# Patient Record
Sex: Female | Born: 1955 | Race: White | Hispanic: No | Marital: Married | Smoking: Current every day smoker
Health system: Southern US, Community
[De-identification: ages and names within clinical notes are randomized; demographics above are authoritative.]

## PROBLEM LIST (undated history)

## (undated) DIAGNOSIS — F32A Depression, unspecified: Secondary | ICD-10-CM

## (undated) DIAGNOSIS — C801 Malignant (primary) neoplasm, unspecified: Secondary | ICD-10-CM

## (undated) DIAGNOSIS — F1010 Alcohol abuse, uncomplicated: Secondary | ICD-10-CM

## (undated) DIAGNOSIS — I480 Paroxysmal atrial fibrillation: Secondary | ICD-10-CM

## (undated) DIAGNOSIS — F329 Major depressive disorder, single episode, unspecified: Secondary | ICD-10-CM

## (undated) DIAGNOSIS — K709 Alcoholic liver disease, unspecified: Secondary | ICD-10-CM

## (undated) DIAGNOSIS — K219 Gastro-esophageal reflux disease without esophagitis: Secondary | ICD-10-CM

## (undated) HISTORY — PX: MOLE REMOVAL: SHX2046

---

## 2016-09-26 ENCOUNTER — Encounter: Payer: Self-pay | Admitting: Emergency Medicine

## 2016-09-26 ENCOUNTER — Inpatient Hospital Stay
Admission: EM | Admit: 2016-09-26 | Discharge: 2016-09-27 | DRG: 439 | Disposition: A | Discharge status: Home / Self Care | Payer: Self-pay | Attending: Internal Medicine | Admitting: Internal Medicine

## 2016-09-26 ENCOUNTER — Inpatient Hospital Stay: Payer: Self-pay

## 2016-09-26 DIAGNOSIS — R1114 Bilious vomiting: Secondary | ICD-10-CM

## 2016-09-26 DIAGNOSIS — Z79899 Other long term (current) drug therapy: Secondary | ICD-10-CM

## 2016-09-26 DIAGNOSIS — N390 Urinary tract infection, site not specified: Secondary | ICD-10-CM | POA: Diagnosis present

## 2016-09-26 DIAGNOSIS — K852 Alcohol induced acute pancreatitis without necrosis or infection: Principal | ICD-10-CM | POA: Diagnosis present

## 2016-09-26 DIAGNOSIS — Z23 Encounter for immunization: Secondary | ICD-10-CM

## 2016-09-26 DIAGNOSIS — K859 Acute pancreatitis without necrosis or infection, unspecified: Secondary | ICD-10-CM | POA: Diagnosis present

## 2016-09-26 DIAGNOSIS — E440 Moderate protein-calorie malnutrition: Secondary | ICD-10-CM | POA: Diagnosis present

## 2016-09-26 DIAGNOSIS — Z6822 Body mass index (BMI) 22.0-22.9, adult: Secondary | ICD-10-CM

## 2016-09-26 DIAGNOSIS — E876 Hypokalemia: Secondary | ICD-10-CM | POA: Diagnosis present

## 2016-09-26 DIAGNOSIS — E871 Hypo-osmolality and hyponatremia: Secondary | ICD-10-CM | POA: Diagnosis present

## 2016-09-26 DIAGNOSIS — F17210 Nicotine dependence, cigarettes, uncomplicated: Secondary | ICD-10-CM | POA: Diagnosis present

## 2016-09-26 DIAGNOSIS — F10988 Alcohol use, unspecified with other alcohol-induced disorder: Secondary | ICD-10-CM | POA: Diagnosis present

## 2016-09-26 DIAGNOSIS — E860 Dehydration: Secondary | ICD-10-CM | POA: Diagnosis present

## 2016-09-26 DIAGNOSIS — F329 Major depressive disorder, single episode, unspecified: Secondary | ICD-10-CM | POA: Diagnosis present

## 2016-09-26 HISTORY — DX: Major depressive disorder, single episode, unspecified: F32.9

## 2016-09-26 HISTORY — DX: Alcohol abuse, uncomplicated: F10.10

## 2016-09-26 HISTORY — DX: Depression, unspecified: F32.A

## 2016-09-26 LAB — URINALYSIS COMPLETE WITH MICROSCOPIC (ARMC ONLY)
Bilirubin Urine: NEGATIVE
Glucose, UA: NEGATIVE mg/dL
Leukocytes, UA: NEGATIVE
NITRITE: NEGATIVE
PROTEIN: 30 mg/dL — AB
Specific Gravity, Urine: 1.018 (ref 1.005–1.030)
pH: 5 (ref 5.0–8.0)

## 2016-09-26 LAB — CBC
HEMATOCRIT: 40.9 % (ref 35.0–47.0)
Hemoglobin: 14.4 g/dL (ref 12.0–16.0)
MCH: 34.7 pg — ABNORMAL HIGH (ref 26.0–34.0)
MCHC: 35.2 g/dL (ref 32.0–36.0)
MCV: 98.5 fL (ref 80.0–100.0)
PLATELETS: 205 10*3/uL (ref 150–440)
RBC: 4.16 MIL/uL (ref 3.80–5.20)
RDW: 12.5 % (ref 11.5–14.5)
WBC: 9.1 10*3/uL (ref 3.6–11.0)

## 2016-09-26 LAB — COMPREHENSIVE METABOLIC PANEL
ALBUMIN: 3.9 g/dL (ref 3.5–5.0)
ALK PHOS: 153 U/L — AB (ref 38–126)
ALT: 65 U/L — AB (ref 14–54)
AST: 145 U/L — AB (ref 15–41)
Anion gap: 26 — ABNORMAL HIGH (ref 5–15)
BILIRUBIN TOTAL: 2.4 mg/dL — AB (ref 0.3–1.2)
BUN: 35 mg/dL — AB (ref 6–20)
CALCIUM: 9.6 mg/dL (ref 8.9–10.3)
CO2: 26 mmol/L (ref 22–32)
CREATININE: 1.15 mg/dL — AB (ref 0.44–1.00)
Chloride: 76 mmol/L — ABNORMAL LOW (ref 101–111)
GFR calc Af Amer: 59 mL/min — ABNORMAL LOW (ref >60)
GFR calc non Af Amer: 51 mL/min — ABNORMAL LOW (ref >60)
GLUCOSE: 130 mg/dL — AB (ref 65–99)
POTASSIUM: 3.1 mmol/L — AB (ref 3.5–5.1)
Sodium: 128 mmol/L — ABNORMAL LOW (ref 135–145)
TOTAL PROTEIN: 7.9 g/dL (ref 6.5–8.1)

## 2016-09-26 LAB — TSH: TSH: 1.173 u[IU]/mL (ref 0.350–4.500)

## 2016-09-26 LAB — LIPASE, BLOOD: Lipase: 231 U/L — ABNORMAL HIGH (ref 11–51)

## 2016-09-26 LAB — MAGNESIUM: MAGNESIUM: 2.3 mg/dL (ref 1.7–2.4)

## 2016-09-26 LAB — TROPONIN I

## 2016-09-26 MED ORDER — POTASSIUM CHLORIDE IN NACL 40-0.9 MEQ/L-% IV SOLN
INTRAVENOUS | Status: DC
  Administered 2016-09-26: 125 mL/h via INTRAVENOUS
  Filled 2016-09-26 (×5): qty 1000

## 2016-09-26 MED ORDER — HYDROCODONE-ACETAMINOPHEN 5-325 MG PO TABS: 1.0000 | ORAL_TABLET | ORAL | Status: DC | PRN

## 2016-09-26 MED ORDER — POTASSIUM CHLORIDE CRYS ER 20 MEQ PO TBCR
40.0000 meq | EXTENDED_RELEASE_TABLET | ORAL | Status: AC
  Administered 2016-09-26: 40 meq via ORAL
  Filled 2016-09-26: qty 2

## 2016-09-26 MED ORDER — LORAZEPAM 2 MG/ML IJ SOLN: 1.0000 mg | Freq: Four times a day (QID) | INTRAMUSCULAR | Status: DC | PRN

## 2016-09-26 MED ORDER — ENOXAPARIN SODIUM 40 MG/0.4ML ~~LOC~~ SOLN
40.0000 mg | SUBCUTANEOUS | Status: DC
  Administered 2016-09-26: 40 mg via SUBCUTANEOUS

## 2016-09-26 MED ORDER — VITAMIN B-1 100 MG PO TABS
100.0000 mg | ORAL_TABLET | Freq: Every day | ORAL | Status: DC
  Administered 2016-09-26: 23:00:00 100 mg via ORAL
  Filled 2016-09-26: qty 1

## 2016-09-26 MED ORDER — FAMOTIDINE IN NACL 20-0.9 MG/50ML-% IV SOLN
20.0000 mg | Freq: Two times a day (BID) | INTRAVENOUS | Status: DC
  Administered 2016-09-26 – 2016-09-27 (×2): 20 mg via INTRAVENOUS
  Filled 2016-09-26 (×4): qty 50

## 2016-09-26 MED ORDER — ACETAMINOPHEN 650 MG RE SUPP: 650.0000 mg | Freq: Four times a day (QID) | RECTAL | Status: DC | PRN

## 2016-09-26 MED ORDER — ACETAMINOPHEN 325 MG PO TABS: 650.0000 mg | ORAL_TABLET | Freq: Four times a day (QID) | ORAL | Status: DC | PRN

## 2016-09-26 MED ORDER — ONDANSETRON HCL 4 MG/2ML IJ SOLN: 4.0000 mg | Freq: Four times a day (QID) | INTRAMUSCULAR | Status: DC | PRN

## 2016-09-26 MED ORDER — INFLUENZA VAC SPLIT QUAD 0.5 ML IM SUSY
0.5000 mL | PREFILLED_SYRINGE | INTRAMUSCULAR | Status: AC
  Administered 2016-09-27: 0.5 mL via INTRAMUSCULAR
  Filled 2016-09-26: qty 0.5

## 2016-09-26 MED ORDER — ADULT MULTIVITAMIN W/MINERALS CH
1.0000 | ORAL_TABLET | Freq: Every day | ORAL | Status: DC
  Administered 2016-09-26: 1 via ORAL
  Filled 2016-09-26: qty 1

## 2016-09-26 MED ORDER — MORPHINE SULFATE (PF) 2 MG/ML IV SOLN: 2.0000 mg | INTRAVENOUS | Status: DC | PRN

## 2016-09-26 MED ORDER — ONDANSETRON HCL 4 MG PO TABS: 4.0000 mg | ORAL_TABLET | Freq: Four times a day (QID) | ORAL | Status: DC | PRN

## 2016-09-26 MED ORDER — PNEUMOCOCCAL VAC POLYVALENT 25 MCG/0.5ML IJ INJ
0.5000 mL | INJECTION | INTRAMUSCULAR | Status: AC
  Administered 2016-09-27: 0.5 mL via INTRAMUSCULAR
  Filled 2016-09-26: qty 4

## 2016-09-26 MED ORDER — FOLIC ACID 1 MG PO TABS
1.0000 mg | ORAL_TABLET | Freq: Every day | ORAL | Status: DC
  Administered 2016-09-26: 1 mg via ORAL
  Filled 2016-09-26: qty 1

## 2016-09-26 MED ORDER — IOPAMIDOL (ISOVUE-300) INJECTION 61%
75.0000 mL | Freq: Once | INTRAVENOUS | Status: AC | PRN
Start: 2016-09-26 — End: 2016-09-26
  Administered 2016-09-26: 15:00:00 75 mL via INTRAVENOUS

## 2016-09-26 MED ORDER — SODIUM CHLORIDE 0.9 % IV SOLN
Freq: Once | INTRAVENOUS | Status: AC
  Administered 2016-09-26: 12:00:00 via INTRAVENOUS

## 2016-09-26 MED ORDER — LORAZEPAM 2 MG/ML IJ SOLN: 0.0000 mg | Freq: Two times a day (BID) | INTRAMUSCULAR | Status: DC

## 2016-09-26 MED ORDER — LORAZEPAM 1 MG PO TABS: 1.0000 mg | ORAL_TABLET | Freq: Four times a day (QID) | ORAL | Status: DC | PRN

## 2016-09-26 MED ORDER — THIAMINE HCL 100 MG/ML IJ SOLN
100.0000 mg | Freq: Every day | INTRAMUSCULAR | Status: DC
  Administered 2016-09-27: 09:00:00 100 mg via INTRAVENOUS
  Filled 2016-09-26: qty 2

## 2016-09-26 MED ORDER — ONDANSETRON HCL 4 MG/2ML IJ SOLN
4.0000 mg | Freq: Once | INTRAMUSCULAR | Status: AC
Start: 2016-09-26 — End: 2016-09-26
  Administered 2016-09-26: 4 mg via INTRAVENOUS
  Filled 2016-09-26: qty 2

## 2016-09-26 MED ORDER — IOPAMIDOL (ISOVUE-300) INJECTION 61%
30.0000 mL | Freq: Once | INTRAVENOUS | Status: AC | PRN
  Administered 2016-09-26: 30 mL via ORAL

## 2016-09-26 MED ORDER — LORAZEPAM 2 MG/ML IJ SOLN
0.0000 mg | Freq: Four times a day (QID) | INTRAMUSCULAR | Status: DC
  Administered 2016-09-26: 1 mg via INTRAVENOUS

## 2016-09-26 NOTE — ED Notes (Signed)
Pt still drinking her contrast advised she needs to finish her contrast so she can get her ct

## 2016-09-26 NOTE — ED Provider Notes (Signed)
[facility] Emergency Department Provider Note  ____________________________________________   First MD Initiated Contact with Patient 09/26/16 1137     (approximate)  I have reviewed the triage vital signs and the nursing notes.   HISTORY  Chief Complaint Emesis and Abdominal Pain   HPI [name] is a 60 y.o. female Reports nausea vomiting no diarrhea and abdominal pain starting 8 days ago. Patient's been unable to keep anything down for days. She states this morning she is able to keep some Jell-O down. She reports the symptoms started after she was coming off of alcohol binge. She has had these symptoms before but never this bad. Pain is achy in in the upper abdomen.patient also says she went to see her doctor was taking Macrobid for UTI that she had. Since improved but then when she started having nausea and vomiting she stopped the Macrobid and symptoms and return. Patient did not finish the course of Macrobid.   Past Medical History:  Diagnosis Date  . Depression   . ETOH abuse     Patient Active Problem List   Diagnosis Date Noted  . Acute pancreatitis 09/26/2016    Past Surgical History:  Procedure Laterality Date  . MOLE REMOVAL      Prior to Admission medications   Medication Sig Start Date End Date Taking? Authorizing Provider  TRAZODONE HCL PO Take by mouth.    Historical Provider, MD  venlafaxine XR (EFFEXOR-XR) 75 MG 24 hr capsule Take 225 mg by mouth daily with breakfast.    Historical Provider, MD    Allergies Review of patient's allergies indicates no known allergies.  Family History  Problem Relation Age of Onset  . Family history unknown: Yes    Social History Social History  Substance Use Topics  . Smoking status: Current Every Day Smoker    Packs/day: 0.50    Types: Cigarettes  . Smokeless tobacco: Never Used  . Alcohol use 14.4 oz/week    24 Cans of beer per week    Review of Systems Constitutional: No  fever/chills Eyes: No visual changes. ENT: No sore throat. Cardiovascular: Denies chest pain. Respiratory: Denies shortness of breath. Gastrointestinal: the history of present illness Genitourinary: patient reports dysuria as well.. Musculoskeletal: Negative for back pain. Skin: Negative for rash.  10-point ROS otherwise negative.  ____________________________________________   PHYSICAL EXAM:  VITAL SIGNS: ED Triage Vitals [09/26/16 1110]  Enc Vitals Group     BP 118/74     Pulse Rate (!) 103     Resp 18     Temp 97.8 F (36.6 C)     Temp Source Oral     SpO2 97 %     Weight 160 lb (72.6 kg)     Height 5\' 10"  (1.778 m)     Head Circumference      Peak Flow      Pain Score 8     Pain Loc      Pain Edu?      Excl. in [place]?     Constitutional: Alert and oriented. Well appearing and in no acute distress. Eyes: Conjunctivae are normal. PERRL. EOMI. Head: Atraumatic. Nose: No congestion/rhinnorhea. Mouth/Throat: Mucous membranes are moist.  Oropharynx non-erythematous. Neck: No stridor.  Cardiovascular: Normal rate, regular rhythm. Grossly normal heart sounds.  Good peripheral circulation. Respiratory: Normal respiratory effort.  No retractions. Lungs CTAB. Gastrointestinal: Soft mild to moderate tenderness in the upper abdomen. No distention. No abdominal bruits. No CVA tenderness. Musculoskeletal: No  lower extremity tenderness nor edema.  No joint effusions. Neurologic:  Normal speech and language. No gross focal neurologic deficits are appreciated. No gait instability. Skin:  Skin is warm, dry and intact. No rash noted. Psychiatric: Mood and affect are normal. Speech and behavior are normal.  ____________________________________________   LABS (all labs ordered are listed, but only abnormal results are displayed)  Labs Reviewed  LIPASE, BLOOD - Abnormal; Notable for the following:       Result Value   Lipase 231 (*)    All other components within normal limits    COMPREHENSIVE METABOLIC PANEL - Abnormal; Notable for the following:    Sodium 128 (*)    Potassium 3.1 (*)    Chloride 76 (*)    Glucose, Bld 130 (*)    BUN 35 (*)    Creatinine, Ser 1.15 (*)    AST 145 (*)    ALT 65 (*)    Alkaline Phosphatase 153 (*)    Total Bilirubin 2.4 (*)    GFR calc non Af Amer 51 (*)    GFR calc Af Amer 59 (*)    Anion gap 26 (*)    All other components within normal limits  CBC - Abnormal; Notable for the following:    MCH 34.7 (*)    All other components within normal limits  URINALYSIS COMPLETEWITH MICROSCOPIC ([facility] ONLY) - Abnormal; Notable for the following:    Color, Urine AMBER (*)    APPearance HAZY (*)    Ketones, ur 2+ (*)    Hgb urine dipstick 1+ (*)    Protein, ur 30 (*)    Bacteria, UA RARE (*)    Squamous Epithelial / LPF 6-30 (*)    All other components within normal limits  TROPONIN I  TSH  MAGNESIUM   ____________________________________________  EKG  EKG read and interpreted by me shows sinus tachycardia rate of 110 and normal axis nonspecific ST-T wave changes one PVC_______________________________________  RADIOLOGY  Study Result   CLINICAL DATA:  Vomiting and epigastric pain over the last week.  EXAM: CT ABDOMEN AND PELVIS WITH CONTRAST  TECHNIQUE: Multidetector CT imaging of the abdomen and pelvis was performed using the standard protocol following bolus administration of intravenous contrast.  CONTRAST:  42mL ISOVUE-300 IOPAMIDOL (ISOVUE-300) INJECTION 61%  COMPARISON:  None.  FINDINGS: Lower chest: Normal  Hepatobiliary: Profound diffuse fatty change of the liver. No calcified gallstones.  Pancreas: There is mild edema adjacent to the anterior surface of the pancreas at the junction of the pancreatic head and body. This could be evidence of low level pancreatitis. The remainder of the organ appears normal.  Spleen: Normal  Adrenals/Urinary Tract: The adrenal glands are normal. Both  kidneys appear intrinsically normal except for 2 small benign cyst ventrally on the right. No bladder abnormality.  Stomach/Bowel: No acute bowel pathology. There is diverticulosis without evidence of diverticulitis.  Vascular/Lymphatic: Mild aortic atherosclerosis.  No aneurysm.  Reproductive: Uterus and adnexal regions are normal for age.  Other: No ascites or free air.  Musculoskeletal: Ordinary degenerative changes affect the spine.  IMPRESSION: Profound diffuse fatty change of the liver which can be symptomatic.  Edema adjacent to the pancreas at the junction of the head and body suggesting low level pancreatitis.  Aortic atherosclerosis.   Electronically Signed   By: Nelson Chimes M.D.   On: 09/26/2016 15:02    ____________________________________________   PROCEDURES  Procedure(s) performed:   Procedures  Critical Care performed:   ____________________________________________  INITIAL IMPRESSION / ASSESSMENT AND PLAN / ED COURSE  Pertinent labs & imaging results that were available during my care of the patient were reviewed by me and considered in my medical decision making (see chart for details).    Clinical Course     ____________________________________________   FINAL CLINICAL IMPRESSION(S) / ED DIAGNOSES  Final diagnoses:  Bilious vomiting with nausea  Dehydration  Alcohol-induced acute pancreatitis without infection or necrosis  Hyponatremia      NEW MEDICATIONS STARTED DURING THIS VISIT:  Current Discharge Medication List       Note:  This document was prepared using Dragon voice recognition software and may include unintentional dictation errors.    Nena Polio, MD 09/26/16 [phone]

## 2016-09-26 NOTE — ED Triage Notes (Addendum)
Pt presents to ED with reports of vomiting and epigastric pain for over one week. Pt states she has had some loose stools but denies diarrhea. Pt denies diarrhea. Pt reports recently been treated for UTI with macrobid. Pt reports she took five days worth of medication. Pt reports an episode of numbness and tingling in the bottom of her feet and fingers which resolved with movement.

## 2016-09-26 NOTE — H&P (Signed)
[facility] [place] NAME: [name]    MR#:  [id number]  DATE OF BIRTH:  09/23/1956  DATE OF ADMISSION:  09/26/2016  PRIMARY CARE PHYSICIAN: Nathaneil Canary, PA-C   REQUESTING/REFERRING PHYSICIAN: Conni Slipper MD  CHIEF COMPLAINT:   Chief Complaint  Patient presents with  . Emesis  . Abdominal Pain    HISTORY OF PRESENT ILLNESS: [name]  is a 60 y.o. female with a known history of  Depression, history of alcohol abuse who is presenting to the ED complaining of having severe nausea and vomiting ongoing for the past 2 weeks. She reports that she has a history of binge drinking and then she stops drinking on her own and goes through withdrawals. Similar thing happen 2 weeks ago when she had her last drink subsequently she started having severe nausea and vomiting. She continued to have this. According to her she hasn't been able to eat solid food for 8 days. She continues to have nausea and dry heaving. She complains of abdominal cramping in the epigastric region. She denies any fevers chills she did have a bowel movement a few days prior which was soft. She does report recent treatment for UTI with Macrobid. Which she did not finish the course and still feels she has a urine infection.  PAST MEDICAL HISTORY:   Past Medical History:  Diagnosis Date  . Depression   . ETOH abuse     PAST SURGICAL HISTORY: Past Surgical History:  Procedure Laterality Date  . MOLE REMOVAL      SOCIAL HISTORY:  Social History  Substance Use Topics  . Smoking status: Current Every Day Smoker    Packs/day: 0.50    Types: Cigarettes  . Smokeless tobacco: Never Used  . Alcohol use 14.4 oz/week    24 Cans of beer per week    FAMILY HISTORY:  Family History  Problem Relation Age of Onset  . Family history unknown: Yes    DRUG ALLERGIES: No Known Allergies  REVIEW OF SYSTEMS:   CONSTITUTIONAL: No fever, fatigue or weakness.   EYES: No blurred or double vision.  EARS, NOSE, AND THROAT: No tinnitus or ear pain.  RESPIRATORY: No cough, shortness of breath, wheezing or hemoptysis.  CARDIOVASCULAR: No chest pain, orthopnea, edema.  GASTROINTESTINAL: Positive nausea, positive vomiting, no diarrhea or positive abdominal cramping GENITOURINARY: No dysuria, hematuria.  ENDOCRINE: No polyuria, nocturia,  HEMATOLOGY: No anemia, easy bruising or bleeding SKIN: No rash or lesion. MUSCULOSKELETAL: No joint pain or arthritis.   NEUROLOGIC: No tingling, numbness, weakness.  PSYCHIATRY: No anxiety or depression.   MEDICATIONS AT HOME:  Prior to Admission medications   Medication Sig Start Date End Date Taking? Authorizing Provider  TRAZODONE HCL PO Take by mouth.    Historical Provider, MD  venlafaxine XR (EFFEXOR-XR) 75 MG 24 hr capsule Take 225 mg by mouth daily with breakfast.    Historical Provider, MD      PHYSICAL EXAMINATION:   VITAL SIGNS: Blood pressure 118/74, pulse (!) 103, temperature 97.8 F (36.6 C), temperature source Oral, resp. rate 18, height 5\' 10"  (1.778 m), weight 160 lb (72.6 kg), SpO2 97 %.  GENERAL:  60 y.o.-year-old patient lying in the bed with no acute distress.  EYES: Pupils equal, round, reactive to light and accommodation. No scleral icterus. Extraocular muscles intact.  HEENT: Head atraumatic, normocephalic. Oropharynx and nasopharynx clear.  NECK:  Supple, no jugular venous distention. No thyroid enlargement, no tenderness.  LUNGS: Normal breath sounds bilaterally, no wheezing, rales,rhonchi or crepitation. No use of accessory muscles of respiration.  CARDIOVASCULAR: S1, S2 normal. No murmurs, rubs, or gallops.  ABDOMEN: Soft, mild tenderness, nondistended. Bowel sounds present. No organomegaly or mass.  EXTREMITIES: No pedal edema, cyanosis, or clubbing.  NEUROLOGIC: Cranial nerves II through XII are intact. Muscle strength 5/5 in all extremities. Sensation intact. Gait not checked.   PSYCHIATRIC: The patient is alert and oriented x 3.  SKIN: No obvious rash, lesion, or ulcer.   LABORATORY PANEL:   CBC  Recent Labs Lab 09/26/16 1115  WBC 9.1  HGB 14.4  HCT 40.9  PLT 205  MCV 98.5  MCH 34.7*  MCHC 35.2  RDW 12.5   ------------------------------------------------------------------------------------------------------------------  Chemistries   Recent Labs Lab 09/26/16 1115  NA 128*  K 3.1*  CL 76*  CO2 26  GLUCOSE 130*  BUN 35*  CREATININE 1.15*  CALCIUM 9.6  AST 145*  ALT 65*  ALKPHOS 153*  BILITOT 2.4*   ------------------------------------------------------------------------------------------------------------------ estimated creatinine clearance is 56.3 mL/min (by C-G formula based on SCr of 1.15 mg/dL (H)). ------------------------------------------------------------------------------------------------------------------ No results for input(s): TSH, T4TOTAL, T3FREE, THYROIDAB in the last 72 hours.  Invalid input(s): FREET3   Coagulation profile No results for input(s): INR, PROTIME in the last 168 hours. ------------------------------------------------------------------------------------------------------------------- No results for input(s): DDIMER in the last 72 hours. -------------------------------------------------------------------------------------------------------------------  Cardiac Enzymes  Recent Labs Lab 09/26/16 1115  TROPONINI <0.03   ------------------------------------------------------------------------------------------------------------------ Invalid input(s): POCBNP  ---------------------------------------------------------------------------------------------------------------  Urinalysis    Component Value Date/Time   COLORURINE AMBER (A) 09/26/2016 1208   APPEARANCEUR HAZY (A) 09/26/2016 1208   LABSPEC 1.018 09/26/2016 1208   PHURINE 5.0 09/26/2016 1208   GLUCOSEU NEGATIVE 09/26/2016 1208   HGBUR  1+ (A) 09/26/2016 1208   BILIRUBINUR NEGATIVE 09/26/2016 1208   KETONESUR 2+ (A) 09/26/2016 1208   PROTEINUR 30 (A) 09/26/2016 1208   NITRITE NEGATIVE 09/26/2016 1208   LEUKOCYTESUR NEGATIVE 09/26/2016 1208     RADIOLOGY: No results found.  EKG: Orders placed or performed during the hospital encounter of 09/26/16  . ED EKG  . ED EKG  . EKG 12-Lead  . EKG 12-Lead    IMPRESSION AND PLAN: Patient is a 61 year old presenting with abdominal pain nausea vomiting   1. Abdominal pain nausea vomiting Suspect due to possible mild acute pancreatitis as well as acute gastritis At this point I will keep her nothing by mouth We'll provide her IV fluids and supportive care and pain control and antiemetics CT scan has been ordered by the ED physician which is currently pending. Which we will review  2. History of alcohol abuse however no drink in 2 weeks at this point she is out of the window for withdrawals will monitor for any symptoms hold off on  CEWA protocol for now  3. Hypokalemia We'll replace potassium  4. MIsc: lovenox   All the records are reviewed and case discussed with ED provider. Management plans discussed with the patient, family and they are in agreement.  CODE STATUS:    Code Status Orders        Start     Ordered   09/26/16 1313  Full code  Continuous     09/26/16 1313    Code Status History    Date Active Date Inactive Code Status Order ID Comments User Context   This patient has a current code status but no historical code status.       TOTAL TIME TAKING CARE OF  THIS PATIENT: 55 minutes.    Dustin Flock M.D on 09/26/2016 at 1:19 PM  Between 7am to 6pm - Pager - [phone]  After 6pm go to [email or web address] - password [email or web address] [facility]  Office  [phone]  CC: Primary care physician; Nathaneil Canary, PA-C

## 2016-09-27 DIAGNOSIS — E440 Moderate protein-calorie malnutrition: Secondary | ICD-10-CM | POA: Insufficient documentation

## 2016-09-27 LAB — COMPREHENSIVE METABOLIC PANEL
ALK PHOS: 111 U/L (ref 38–126)
ALT: 40 U/L (ref 14–54)
ANION GAP: 10 (ref 5–15)
AST: 80 U/L — ABNORMAL HIGH (ref 15–41)
Albumin: 2.8 g/dL — ABNORMAL LOW (ref 3.5–5.0)
BUN: 22 mg/dL — ABNORMAL HIGH (ref 6–20)
CALCIUM: 8.2 mg/dL — AB (ref 8.9–10.3)
CO2: 30 mmol/L (ref 22–32)
CREATININE: 0.73 mg/dL (ref 0.44–1.00)
Chloride: 94 mmol/L — ABNORMAL LOW (ref 101–111)
Glucose, Bld: 89 mg/dL (ref 65–99)
Potassium: 3.9 mmol/L (ref 3.5–5.1)
SODIUM: 134 mmol/L — AB (ref 135–145)
Total Bilirubin: 0.8 mg/dL (ref 0.3–1.2)
Total Protein: 6 g/dL — ABNORMAL LOW (ref 6.5–8.1)

## 2016-09-27 LAB — CBC
HCT: 34.5 % — ABNORMAL LOW (ref 35.0–47.0)
Hemoglobin: 12.2 g/dL (ref 12.0–16.0)
MCH: 34.9 pg — AB (ref 26.0–34.0)
MCHC: 35.4 g/dL (ref 32.0–36.0)
MCV: 98.6 fL (ref 80.0–100.0)
PLATELETS: 156 10*3/uL (ref 150–440)
RBC: 3.5 MIL/uL — AB (ref 3.80–5.20)
RDW: 12.3 % (ref 11.5–14.5)
WBC: 5.2 10*3/uL (ref 3.6–11.0)

## 2016-09-27 LAB — LIPASE, BLOOD: LIPASE: 187 U/L — AB (ref 11–51)

## 2016-09-27 MED ORDER — NITROFURANTOIN MONOHYD MACRO 100 MG PO CAPS: 100.0000 mg | ORAL_CAPSULE | Freq: Two times a day (BID) | ORAL | 0 refills | Status: DC

## 2016-09-27 NOTE — Progress Notes (Signed)
Initial Nutrition Assessment  DOCUMENTATION CODES:   Non-severe (moderate) malnutrition in context of acute illness/injury  INTERVENTION:  -Recommend Ensure Enlive po BID, each supplement provides 350 kcal and 20 grams of protein once diet progressed.       NUTRITION DIAGNOSIS:   Malnutrition related to acute illness as evidenced by percent weight loss, energy intake < 75% for > 7 days, mild depletion of muscle mass.    GOAL:   Patient will meet greater than or equal to 90% of their needs    MONITOR:   Diet advancement  REASON FOR ASSESSMENT:   Malnutrition Screening Tool    ASSESSMENT:      60 y.o female admitted with severe nausea, vomiting ongoing for the past 2 weeks, found to have mild acute pancreatitis and acute gastritis.  Noted pt has not been able to eat solid foods in the last 8 days. Recent treatment for UTI  Pt with history of Etoh abuse, depression  Pt reports poor po intake for the past 8 days, mainly drinking liquids secondary to nausea and vomiting.    Reports wt of 160 pounds at urgent care about 2 weeks ago. Current wt of 153 pounds (4% wt loss in the last 2 weeks)  Medications reviewed: folic acid, MVI, thiamine, NS with KCL at 135ml/hr Labs reviewed: Na 134, BUN 22, creatinine WDL, glucose 187  Nutrition-Focused physical exam completed. Findings are no fat depletion, mild muscle depletion, and no edema.    Diet Order:  Diet NPO time specified  Skin:  Reviewed, no issues  Last BM:  10/1  Height:   Ht Readings from Last 1 Encounters:  09/26/16 5\' 10"  (1.778 m)    Weight:   Wt Readings from Last 1 Encounters:  09/26/16 153 lb 9.6 oz (69.7 kg)    Ideal Body Weight:     BMI:  Body mass index is 22.04 kg/m.  Estimated Nutritional Needs:   Kcal:  1725-2070 kcals/d  Protein:  83-103 g/d  Fluid:  >/=1739ml/d  EDUCATION NEEDS:   No education needs identified at this time  Lanorris Kalisz B. Zenia Resides, [place], [place]  (pager) Weekend/On-Call pager [phone])

## 2016-09-27 NOTE — Discharge Instructions (Signed)
Resume diet and activity as before ° ° °

## 2016-09-28 NOTE — Discharge Summary (Signed)
[facility] at [place] NAME: [name]    MR#:  [id number]  DATE OF BIRTH:  08/13/1956  DATE OF ADMISSION:  09/26/2016 ADMITTING PHYSICIAN: Dustin Flock, MD  DATE OF DISCHARGE: 09/27/2016  2:55 PM  PRIMARY CARE PHYSICIAN: Nathaneil Canary, PA-C   ADMISSION DIAGNOSIS:  Dehydration [E86.0] Hyponatremia [E87.1] Bilious vomiting with nausea [R11.14] Alcohol-induced acute pancreatitis without infection or necrosis [K85.20]  DISCHARGE DIAGNOSIS:  Active Problems:   Acute pancreatitis   Malnutrition of moderate degree   SECONDARY DIAGNOSIS:   Past Medical History:  Diagnosis Date  . Depression   . ETOH abuse      ADMITTING HISTORY  HISTORY OF PRESENT ILLNESS: [name]  is a 60 y.o. female with a known history of  Depression, history of alcohol abuse who is presenting to the ED complaining of having severe nausea and vomiting ongoing for the past 2 weeks. She reports that she has a history of binge drinking and then she stops drinking on her own and goes through withdrawals. Similar thing happen 2 weeks ago when she had her last drink subsequently she started having severe nausea and vomiting. She continued to have this. According to her she hasn't been able to eat solid food for 8 days. She continues to have nausea and dry heaving. She complains of abdominal cramping in the epigastric region. She denies any fevers chills she did have a bowel movement a few days prior which was soft. She does report recent treatment for UTI with Macrobid. Which she did not finish the course and still feels she has a urine infection.  HOSPITAL COURSE:   1. Acute alcoholic pancreatitis Elevated lipase has treded down. CT abd showed some fatty liver and mild pancreatitis. By day of discharge she has tolerated diet. No pain or nausea. Counseled to quit alcohol  3. Hypokalemia Replaced and resolved  Stable for discharge home  CONSULTS  OBTAINED:    DRUG ALLERGIES:  No Known Allergies  DISCHARGE MEDICATIONS:   Discharge Medication List as of 09/27/2016  1:44 PM    START taking these medications   Details  nitrofurantoin, macrocrystal-monohydrate, (MACROBID) 100 MG capsule Take 1 capsule (100 mg total) by mouth 2 (two) times daily., Starting Mon 09/27/2016, Normal        Today   VITAL SIGNS:  Blood pressure (!) 101/51, pulse 89, temperature 98.2 F (36.8 C), temperature source Oral, resp. rate 20, height 5\' 10"  (1.778 m), weight 69.7 kg (153 lb 9.6 oz), SpO2 97 %.  I/O:  No intake or output data in the 24 hours ending 09/28/16 1617  PHYSICAL EXAMINATION:  Physical Exam  GENERAL:  60 y.o.-year-old patient lying in the bed with no acute distress.  LUNGS: Normal breath sounds bilaterally, no wheezing, rales,rhonchi or crepitation. No use of accessory muscles of respiration.  CARDIOVASCULAR: S1, S2 normal. No murmurs, rubs, or gallops.  ABDOMEN: Soft, non-tender, non-distended. Bowel sounds present. No organomegaly or mass.  NEUROLOGIC: Moves all 4 extremities. PSYCHIATRIC: The patient is alert and oriented x 3.  SKIN: No obvious rash, lesion, or ulcer.   DATA REVIEW:   CBC  Recent Labs Lab 09/27/16 0509  WBC 5.2  HGB 12.2  HCT 34.5*  PLT 156    Chemistries   Recent Labs Lab 09/26/16 1115 09/27/16 0509  NA 128* 134*  K 3.1* 3.9  CL 76* 94*  CO2 26 30  GLUCOSE 130* 89  BUN 35* 22*  CREATININE 1.15*  0.73  CALCIUM 9.6 8.2*  MG 2.3  --   AST 145* 80*  ALT 65* 40  ALKPHOS 153* 111  BILITOT 2.4* 0.8    Cardiac Enzymes  Recent Labs Lab 09/26/16 1115  [place] <0.03    Microbiology Results  No results found for this or any previous visit.  RADIOLOGY:  No results found.  Follow up with PCP in 1 week.  Management plans discussed with the patient, family and they are in agreement.  CODE STATUS:  Code Status History    Date Active Date Inactive Code Status Order ID Comments  User Context   09/26/2016  1:13 PM 09/27/2016  6:02 PM Full Code [id number]  Dustin Flock, MD ED    Advance Directive Documentation   Flowsheet Row Most Recent Value  Type of Advance Directive  Healthcare Power of Attorney, Living will  Pre-existing out of facility DNR order (yellow form or pink MOST form)  No data  "MOST" Form in Place?  No data      TOTAL TIME TAKING CARE OF THIS PATIENT ON DAY OF DISCHARGE: more than 30 minutes.   Hillary Bow R M.D on 09/28/2016 at 4:17 PM  Between 7am to 6pm - Pager - [phone]  After 6pm go to [email or web address] - password [email or web address] [facility]  [facility]  Office  [phone]  CC: Primary care physician; Nathaneil Canary, PA-C  Note: This dictation was prepared with Dragon dictation along with smaller phrase technology. Any transcriptional errors that result from this process are unintentional.

## 2017-04-27 ENCOUNTER — Ambulatory Visit: Payer: Self-pay | Attending: Oncology

## 2017-04-27 ENCOUNTER — Ambulatory Visit
Admission: RE | Admit: 2017-04-27 | Discharge: 2017-04-27 | Disposition: A | Discharge status: Home / Self Care | Payer: Self-pay | Source: Ambulatory Visit | Attending: Oncology | Admitting: Oncology

## 2017-04-27 VITALS — BP 112/77 | HR 98 | Temp 98.8°F | Ht 69.0 in | Wt 158.0 lb

## 2017-04-27 DIAGNOSIS — Z0000 Encounter for general adult medical examination without abnormal findings: Secondary | ICD-10-CM

## 2017-04-27 HISTORY — DX: Malignant (primary) neoplasm, unspecified: C80.1

## 2017-04-27 NOTE — Progress Notes (Signed)
Subjective:     Patient ID: [name], female   DOB: 06-15-1956, 61 y.o.   MRN: [id number]  HPI   Review of Systems     Objective:   Physical Exam  Pulmonary/Chest: Right breast exhibits no inverted nipple, no mass, no nipple discharge, no skin change and no tenderness. Left breast exhibits no inverted nipple, no mass, no nipple discharge, no skin change and no tenderness. Breasts are symmetrical.  Genitourinary: No labial fusion. There is no rash, tenderness, lesion or injury on the right labia. There is no rash, tenderness, lesion or injury on the left labia. Uterus is not deviated, not enlarged, not fixed and not tender. Cervix exhibits no motion tenderness, no discharge and no friability. Right adnexum displays no mass, no tenderness and no fullness. Left adnexum displays no mass, no tenderness and no fullness. No erythema, tenderness or bleeding in the vagina. No signs of injury around the vagina. No vaginal discharge found.       Assessment:     61 year old patient presents for BCCCP clinic visit.  Patient screened, and meets BCCCP eligibility.  Patient does not have insurance, Medicare or Medicaid.  Handout given on Affordable Care Act.  Instructed patient on breast self-exam using teach back method.  CBE unremarkable. No mass or lump palpated.   Pelvic exam normal.  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  Last mammogram at [facility].    Plan:     Sent for bilateral screening mammogram.  Specimen collected for pap.

## 2017-04-28 ENCOUNTER — Inpatient Hospital Stay
Admission: RE | Admit: 2017-04-28 | Discharge: 2017-04-28 | Disposition: A | Discharge status: Home / Self Care | Payer: Self-pay | Source: Ambulatory Visit | Attending: *Deleted | Admitting: *Deleted

## 2017-04-28 ENCOUNTER — Other Ambulatory Visit: Payer: Self-pay | Admitting: *Deleted

## 2017-04-28 DIAGNOSIS — Z9289 Personal history of other medical treatment: Secondary | ICD-10-CM

## 2017-04-30 LAB — PAP LB AND HPV HIGH-RISK
HPV, high-risk: NEGATIVE
PAP Smear Comment: 0

## 2017-05-04 NOTE — Progress Notes (Signed)
Phoned patient with negative/negative pap, and normal mammogram results.  Have not completed HSIS Data.

## 2017-05-17 NOTE — Progress Notes (Signed)
Copy to HSIS. 

## 2017-11-26 ENCOUNTER — Other Ambulatory Visit: Payer: Self-pay

## 2017-11-26 ENCOUNTER — Inpatient Hospital Stay
Admission: EM | Admit: 2017-11-26 | Discharge: 2017-11-29 | DRG: 309 | Disposition: A | Discharge status: Home / Self Care | Payer: Self-pay | Attending: Internal Medicine | Admitting: Internal Medicine

## 2017-11-26 ENCOUNTER — Inpatient Hospital Stay: Payer: Self-pay

## 2017-11-26 DIAGNOSIS — E871 Hypo-osmolality and hyponatremia: Secondary | ICD-10-CM | POA: Diagnosis present

## 2017-11-26 DIAGNOSIS — I4891 Unspecified atrial fibrillation: Principal | ICD-10-CM | POA: Diagnosis present

## 2017-11-26 DIAGNOSIS — Y900 Blood alcohol level of less than 20 mg/100 ml: Secondary | ICD-10-CM | POA: Diagnosis present

## 2017-11-26 DIAGNOSIS — K5900 Constipation, unspecified: Secondary | ICD-10-CM | POA: Diagnosis present

## 2017-11-26 DIAGNOSIS — E872 Acidosis: Secondary | ICD-10-CM | POA: Diagnosis present

## 2017-11-26 DIAGNOSIS — R945 Abnormal results of liver function studies: Secondary | ICD-10-CM | POA: Diagnosis present

## 2017-11-26 DIAGNOSIS — F17210 Nicotine dependence, cigarettes, uncomplicated: Secondary | ICD-10-CM | POA: Diagnosis present

## 2017-11-26 DIAGNOSIS — E876 Hypokalemia: Secondary | ICD-10-CM | POA: Diagnosis present

## 2017-11-26 DIAGNOSIS — Z79899 Other long term (current) drug therapy: Secondary | ICD-10-CM

## 2017-11-26 DIAGNOSIS — R531 Weakness: Secondary | ICD-10-CM | POA: Diagnosis present

## 2017-11-26 DIAGNOSIS — E8729 Other acidosis: Secondary | ICD-10-CM

## 2017-11-26 DIAGNOSIS — E860 Dehydration: Secondary | ICD-10-CM | POA: Diagnosis present

## 2017-11-26 DIAGNOSIS — I480 Paroxysmal atrial fibrillation: Secondary | ICD-10-CM | POA: Diagnosis present

## 2017-11-26 DIAGNOSIS — F1020 Alcohol dependence, uncomplicated: Secondary | ICD-10-CM | POA: Diagnosis present

## 2017-11-26 DIAGNOSIS — R188 Other ascites: Secondary | ICD-10-CM

## 2017-11-26 DIAGNOSIS — F329 Major depressive disorder, single episode, unspecified: Secondary | ICD-10-CM | POA: Diagnosis present

## 2017-11-26 DIAGNOSIS — R1110 Vomiting, unspecified: Secondary | ICD-10-CM

## 2017-11-26 DIAGNOSIS — I471 Supraventricular tachycardia: Secondary | ICD-10-CM | POA: Diagnosis present

## 2017-11-26 DIAGNOSIS — E878 Other disorders of electrolyte and fluid balance, not elsewhere classified: Secondary | ICD-10-CM | POA: Diagnosis present

## 2017-11-26 DIAGNOSIS — Z8719 Personal history of other diseases of the digestive system: Secondary | ICD-10-CM

## 2017-11-26 DIAGNOSIS — K7460 Unspecified cirrhosis of liver: Secondary | ICD-10-CM | POA: Diagnosis present

## 2017-11-26 DIAGNOSIS — I951 Orthostatic hypotension: Secondary | ICD-10-CM | POA: Diagnosis present

## 2017-11-26 DIAGNOSIS — I10 Essential (primary) hypertension: Secondary | ICD-10-CM | POA: Diagnosis present

## 2017-11-26 LAB — COMPREHENSIVE METABOLIC PANEL
ALT: 71 U/L — ABNORMAL HIGH (ref 14–54)
ANION GAP: 30 — AB (ref 5–15)
AST: 216 U/L — ABNORMAL HIGH (ref 15–41)
Albumin: 3.7 g/dL (ref 3.5–5.0)
Alkaline Phosphatase: 190 U/L — ABNORMAL HIGH (ref 38–126)
BUN: 23 mg/dL — ABNORMAL HIGH (ref 6–20)
CHLORIDE: 80 mmol/L — AB (ref 101–111)
CO2: 20 mmol/L — AB (ref 22–32)
Calcium: 11.3 mg/dL — ABNORMAL HIGH (ref 8.9–10.3)
Creatinine, Ser: 1.22 mg/dL — ABNORMAL HIGH (ref 0.44–1.00)
GFR, EST AFRICAN AMERICAN: 54 mL/min — AB (ref >60)
GFR, EST NON AFRICAN AMERICAN: 47 mL/min — AB (ref >60)
Glucose, Bld: 236 mg/dL — ABNORMAL HIGH (ref 65–99)
POTASSIUM: 2.3 mmol/L — AB (ref 3.5–5.1)
SODIUM: 130 mmol/L — AB (ref 135–145)
Total Bilirubin: 2.3 mg/dL — ABNORMAL HIGH (ref 0.3–1.2)
Total Protein: 8 g/dL (ref 6.5–8.1)

## 2017-11-26 LAB — CBC WITH DIFFERENTIAL/PLATELET
Basophils Absolute: 0.1 10*3/uL (ref 0–0.1)
Basophils Relative: 1 %
EOS PCT: 0 %
Eosinophils Absolute: 0 10*3/uL (ref 0–0.7)
HCT: 48.2 % — ABNORMAL HIGH (ref 35.0–47.0)
Hemoglobin: 16.4 g/dL — ABNORMAL HIGH (ref 12.0–16.0)
LYMPHS ABS: 2.9 10*3/uL (ref 1.0–3.6)
Lymphocytes Relative: 18 %
MCH: 36.2 pg — AB (ref 26.0–34.0)
MCHC: 34.1 g/dL (ref 32.0–36.0)
MCV: 106.4 fL — ABNORMAL HIGH (ref 80.0–100.0)
MONO ABS: 1.8 10*3/uL — AB (ref 0.2–0.9)
Monocytes Relative: 11 %
Neutro Abs: 11.5 10*3/uL — ABNORMAL HIGH (ref 1.4–6.5)
Neutrophils Relative %: 70 %
Platelets: 356 10*3/uL (ref 150–440)
RBC: 4.53 MIL/uL (ref 3.80–5.20)
RDW: 14.6 % — AB (ref 11.5–14.5)
WBC: 16.3 10*3/uL — AB (ref 3.6–11.0)

## 2017-11-26 LAB — ETHANOL

## 2017-11-26 LAB — TROPONIN I

## 2017-11-26 LAB — MAGNESIUM: MAGNESIUM: 1.5 mg/dL — AB (ref 1.7–2.4)

## 2017-11-26 LAB — LIPASE, BLOOD: LIPASE: 41 U/L (ref 11–51)

## 2017-11-26 MED ORDER — DIGOXIN 0.25 MG/ML IJ SOLN
0.5000 mg | Freq: Once | INTRAMUSCULAR | Status: AC
  Administered 2017-11-26: 0.5 mg via INTRAVENOUS
  Filled 2017-11-26: qty 2

## 2017-11-26 MED ORDER — PANTOPRAZOLE SODIUM 40 MG IV SOLR
INTRAVENOUS | Status: AC
  Administered 2017-11-26: 40 mg
  Filled 2017-11-26: qty 40

## 2017-11-26 MED ORDER — KCL IN DEXTROSE-NACL 40-5-0.45 MEQ/L-%-% IV SOLN
INTRAVENOUS | Status: DC
  Administered 2017-11-26: 22:00:00 via INTRAVENOUS
  Filled 2017-11-26 (×5): qty 1000

## 2017-11-26 MED ORDER — NICOTINE 14 MG/24HR TD PT24
14.0000 mg | MEDICATED_PATCH | Freq: Every day | TRANSDERMAL | Status: DC
  Filled 2017-11-26 (×3): qty 1

## 2017-11-26 MED ORDER — POTASSIUM CHLORIDE CRYS ER 20 MEQ PO TBCR
20.0000 meq | EXTENDED_RELEASE_TABLET | Freq: Once | ORAL | Status: AC
  Administered 2017-11-26: 20 meq via ORAL

## 2017-11-26 MED ORDER — ONDANSETRON HCL 4 MG/2ML IJ SOLN
INTRAMUSCULAR | Status: AC
  Administered 2017-11-26: 4 mg
  Filled 2017-11-26: qty 2

## 2017-11-26 MED ORDER — MAGNESIUM SULFATE 2 GM/50ML IV SOLN
2.0000 g | Freq: Once | INTRAVENOUS | Status: AC
  Administered 2017-11-26: 2 g via INTRAVENOUS
  Filled 2017-11-26: qty 50

## 2017-11-26 MED ORDER — POTASSIUM CHLORIDE CRYS ER 20 MEQ PO TBCR
40.0000 meq | EXTENDED_RELEASE_TABLET | Freq: Once | ORAL | Status: AC
  Administered 2017-11-26: 40 meq via ORAL

## 2017-11-26 MED ORDER — POTASSIUM CHLORIDE CRYS ER 20 MEQ PO TBCR: 60.0000 meq | EXTENDED_RELEASE_TABLET | Freq: Once | ORAL | Status: DC

## 2017-11-26 MED ORDER — POTASSIUM CHLORIDE CRYS ER 20 MEQ PO TBCR
EXTENDED_RELEASE_TABLET | ORAL | Status: AC
  Filled 2017-11-26: qty 2

## 2017-11-26 MED ORDER — AMIODARONE IV BOLUS ONLY 150 MG/100ML
INTRAVENOUS | Status: AC
  Filled 2017-11-26: qty 100

## 2017-11-26 MED ORDER — DILTIAZEM HCL 25 MG/5ML IV SOLN
10.0000 mg | Freq: Once | INTRAVENOUS | Status: DC
  Filled 2017-11-26: qty 5

## 2017-11-26 MED ORDER — POTASSIUM CHLORIDE 10 MEQ/100ML IV SOLN
10.0000 meq | INTRAVENOUS | Status: DC
  Administered 2017-11-27 (×3): 10 meq via INTRAVENOUS
  Filled 2017-11-26 (×4): qty 100

## 2017-11-26 MED ORDER — AMIODARONE IV BOLUS ONLY 150 MG/100ML
150.0000 mg | Freq: Once | INTRAVENOUS | Status: AC
  Administered 2017-11-26: 150 mg via INTRAVENOUS

## 2017-11-26 MED ORDER — DILTIAZEM HCL 25 MG/5ML IV SOLN
INTRAVENOUS | Status: AC
  Filled 2017-11-26: qty 5

## 2017-11-26 MED ORDER — SODIUM CHLORIDE 0.9 % IV BOLUS (SEPSIS)
1000.0000 mL | Freq: Once | INTRAVENOUS | Status: AC
  Administered 2017-11-26: 1000 mL via INTRAVENOUS

## 2017-11-26 NOTE — ED Triage Notes (Signed)
Pt found slumped over on toilet, ems unable to get BP, n/v x 3 days, pale, drank etoh yesterday, hx pancreatitis

## 2017-11-26 NOTE — ED Notes (Signed)
Pt reports no chest pain or feelings of fluttering in chest. Pt appears in no distress to look at her.

## 2017-11-26 NOTE — ED Notes (Signed)
Called pharmacy to send meds and notify pt has NKDA

## 2017-11-26 NOTE — H&P (Signed)
[facility] NAME: [name]    MR#:  [id number]  DATE OF BIRTH:  05/06/1956  DATE OF ADMISSION:  11/26/2017  PRIMARY CARE PHYSICIAN: Soles, Howell Rucks, MD   REQUESTING/REFERRING PHYSICIAN:   CHIEF COMPLAINT:   Chief Complaint  Patient presents with  . Loss of Consciousness    HISTORY OF PRESENT ILLNESS: [name]  is a 61 y.o. female with a known history of alcohol abuse with chronically elevated LFTs, depression, hypertension, alcohol induced pancreatitis, presents to the emergency room with generalized weakness-inability to get up off the commode, last drink of alcohol was on yesterday, last 2-3 days patient had associated nausea/vomiting, last bowel movement sober for 5 days ago, in the emergency room noted to have SVT with heart rate of 168-treated with amiodarone bolus dose x1, repeat EKG noted for A. fib with RVR, numerous electrolyte abnormalities noted chemistries, white count 16,000, alcohol level less than 10, patient in no apparent distress in the emergency room, resting comfortably in bed, patient denies any pain, patient now being admitted with new onset A. fib with RVR, numerous electrolyte abnormalities, chronic alcohol abuse/dependency.  PAST MEDICAL HISTORY:   Past Medical History:  Diagnosis Date  . Cancer ([place])    skin  . Depression   . ETOH abuse     PAST SURGICAL HISTORY:  Past Surgical History:  Procedure Laterality Date  . MOLE REMOVAL      SOCIAL HISTORY:  Social History   Tobacco Use  . Smoking status: Current Every Day Smoker    Packs/day: 0.25    Types: Cigarettes  . Smokeless tobacco: Never Used  Substance Use Topics  . Alcohol use: Yes    Alcohol/week: 0.6 oz    Types: 1 Shots of liquor per week    FAMILY HISTORY:  Family History  Problem Relation Age of Onset  . Breast cancer Neg Hx     DRUG ALLERGIES: No Known Allergies  REVIEW OF SYSTEMS:   CONSTITUTIONAL: No fever,  +fatigue/weakness.  EYES: No blurred or double vision.  EARS, NOSE, AND THROAT: No tinnitus or ear pain.  RESPIRATORY: No cough, shortness of breath, wheezing or hemoptysis.  CARDIOVASCULAR: No chest pain, orthopnea, edema.  GASTROINTESTINAL: +nausea/vomiting, no diarrhea or abdominal pain.  Constipation GENITOURINARY: No dysuria, hematuria.  ENDOCRINE: No polyuria, nocturia,  HEMATOLOGY: No anemia, easy bruising or bleeding SKIN: No rash or lesion. MUSCULOSKELETAL: No joint pain or arthritis.   NEUROLOGIC: No tingling, numbness, weakness.  PSYCHIATRY: No anxiety or depression.   MEDICATIONS AT HOME:  Prior to Admission medications   Medication Sig Start Date End Date Taking? Authorizing Provider  venlafaxine XR (EFFEXOR-XR) 75 MG 24 hr capsule Take 225 mg by mouth daily.    Yes [provider]  NASONEX 50 MCG/ACT nasal spray Place 2 sprays into both nostrils daily. 10/18/17   [provider]  ranitidine (ZANTAC) 150 MG tablet Take 150 mg by mouth 2 (two) times daily.    [provider]  traZODone (DESYREL) 100 MG tablet Take 100 mg by mouth at bedtime as needed. for sleep 10/07/17   [provider]      PHYSICAL EXAMINATION:   VITAL SIGNS: Blood pressure 100/68, pulse (!) 103, temperature 98.5 F (36.9 C), temperature source Rectal, resp. rate 18, height 5\' 10"  (1.778 m), weight 68 kg (150 lb), SpO2 94 %.  GENERAL:  61 y.o.-year-old patient lying in the bed with no acute distress.  Nontoxic-appearing EYES:  Pupils equal, round, reactive to light and accommodation. No scleral icterus. Extraocular muscles intact.  HEENT: Head atraumatic, normocephalic. Oropharynx and nasopharynx clear.  NECK:  Supple, no jugular venous distention. No thyroid enlargement, no tenderness.  LUNGS: Normal breath sounds bilaterally, no wheezing, rales,rhonchi or crepitation. No use of accessory muscles of respiration.  CARDIOVASCULAR: Irregular rate and rhythm. No  murmurs, rubs, or gallops.  ABDOMEN: Soft, nontender, nondistended. Bowel sounds present. No organomegaly or mass.  EXTREMITIES: No pedal edema, cyanosis, or clubbing.  NEUROLOGIC: Cranial nerves II through XII are intact. MAES. Gait not checked.  PSYCHIATRIC: The patient is alert and oriented x 3.  SKIN: No obvious rash, lesion, or ulcer.   LABORATORY PANEL:   CBC Recent Labs  Lab 11/26/17 1918  WBC 16.3*  HGB 16.4*  HCT 48.2*  PLT 356  MCV 106.4*  MCH 36.2*  MCHC 34.1  RDW 14.6*  LYMPHSABS 2.9  MONOABS 1.8*  EOSABS 0.0  BASOSABS 0.1   ------------------------------------------------------------------------------------------------------------------  Chemistries  Recent Labs  Lab 11/26/17 1918  NA 130*  K 2.3*  CL 80*  CO2 20*  GLUCOSE 236*  BUN 23*  CREATININE 1.22*  CALCIUM 11.3*  MG 1.5*  AST 216*  ALT 71*  ALKPHOS 190*  BILITOT 2.3*   ------------------------------------------------------------------------------------------------------------------ estimated creatinine clearance is 52 mL/min (A) (by C-G formula based on SCr of 1.22 mg/dL (H)). ------------------------------------------------------------------------------------------------------------------ No results for input(s): TSH, T4TOTAL, T3FREE, THYROIDAB in the last 72 hours.  Invalid input(s): FREET3   Coagulation profile No results for input(s): INR, PROTIME in the last 168 hours. ------------------------------------------------------------------------------------------------------------------- No results for input(s): DDIMER in the last 72 hours. -------------------------------------------------------------------------------------------------------------------  Cardiac Enzymes Recent Labs  Lab 11/26/17 1918  TROPONINI <0.03   ------------------------------------------------------------------------------------------------------------------ Invalid input(s):  POCBNP  ---------------------------------------------------------------------------------------------------------------  Urinalysis    Component Value Date/Time   COLORURINE AMBER (A) 09/26/2016 1208   APPEARANCEUR HAZY (A) 09/26/2016 1208   LABSPEC 1.018 09/26/2016 1208   PHURINE 5.0 09/26/2016 1208   GLUCOSEU NEGATIVE 09/26/2016 1208   HGBUR 1+ (A) 09/26/2016 1208   BILIRUBINUR NEGATIVE 09/26/2016 1208   KETONESUR 2+ (A) 09/26/2016 1208   PROTEINUR 30 (A) 09/26/2016 1208   NITRITE NEGATIVE 09/26/2016 1208   LEUKOCYTESUR NEGATIVE 09/26/2016 1208     RADIOLOGY: No results found.  EKG: Orders placed or performed during the hospital encounter of 11/26/17  . EKG 12-Lead  . EKG 12-Lead  . ED EKG  . ED EKG  . EKG 12-Lead  . EKG 12-Lead  . EKG 12-Lead  . EKG 12-Lead  . EKG 12-Lead  . EKG 12-Lead  . EKG 12-Lead  . EKG 12-Lead    IMPRESSION AND PLAN: 1 acute new onset A. fib with RVR  Chads vas 2 score of 2 Admit to telemetry bed, rule out acute coronary syndrome with cardiac enzymes x3 sets, consult cardiology for expert opinion, digoxin given relative hypotension-check level in the morning, echocardiogram, Lovenox therapeutic dosing twice daily given chads vas score of 2, and continue close medical monitoring  2 chronic alcohol abuse/dependency Will place on alcohol withdrawal protocol  3 acute numerous electrolyte abnormalities-hyponatremia/hypokalemia/hypochloremia/hypo-bicarbonate/hypomagnesemia Secondary to alcohol abuse and nausea/emesis Replete with IV fluids, p.o. potassium, IV potassium, IV magnesium, check BMP in the morning, antiemetics as needed  4 chronic depression Stable Continue home regiment  5 acute constipation Colace twice daily  Full code Condition stable Prognosis fair DVT prophylaxis-on Lovenox twice daily Disposition home in 2-3 days barring any complications     All the records are reviewed  and case discussed with ED  provider. Management plans discussed with the patient, family and they are in agreement.  CODE STATUS: Code Status History    Date Active Date Inactive Code Status Order ID Comments User Context   09/26/2016 13:13 09/27/2016 18:02 Full Code [id number]  Dustin Flock, MD ED    Advance Directive Documentation     Most Recent Value  Type of Advance Directive  Healthcare Power of Attorney, Living will  Pre-existing out of facility DNR order (yellow form or pink MOST form)  No data  "MOST" Form in Place?  No data       TOTAL TIME TAKING CARE OF THIS PATIENT: 45 minutes.    Avel Peace Tremain Rucinski M.D on 11/26/2017   Between 7am to 6pm - Pager - [phone]  After 6pm go to [email or web address] - password [email or web address] [facility]  Office  [phone]  CC: Primary care physician; Herminio Commons, MD   Note: This dictation was prepared with Dragon dictation along with smaller phrase technology. Any transcriptional errors that result from this process are unintentional.

## 2017-11-26 NOTE — ED Provider Notes (Signed)
[facility] Emergency Department Provider Note       Time seen: ----------------------------------------- 7:13 PM on 11/26/2017 -----------------------------------------   I have reviewed the triage vital signs and the nursing notes.  HISTORY   Chief Complaint Loss of Consciousness    HPI [name] is a 61 y.o. female with a history of depression and alcohol abuse with pancreatitis who presents to the ED for generalized weakness.  Patient was found slumped over the toilet at home.  EMS was unable to get a blood pressure.  She reportedly has had nausea vomiting for 3 days and was very pale on their arrival.  She reportedly has been drinking alcohol recently.  She has a history of pancreatitis from same.  Patient denies any specific complaints but has been vomiting and feels very weak and dehydrated.  Past Medical History:  Diagnosis Date  . Cancer ([place])    skin  . Depression   . ETOH abuse     Patient Active Problem List   Diagnosis Date Noted  . Malnutrition of moderate degree 09/27/2016  . Acute pancreatitis 09/26/2016    Past Surgical History:  Procedure Laterality Date  . MOLE REMOVAL      Allergies Patient has no known allergies.  Social History Social History   Tobacco Use  . Smoking status: Current Every Day Smoker    Packs/day: 0.25    Types: Cigarettes  . Smokeless tobacco: Never Used  Substance Use Topics  . Alcohol use: Yes    Alcohol/week: 8.4 oz    Types: 14 Shots of liquor per week    Comment: vodka-1 or 2 shots 5 days per week  . Drug use: No    Review of Systems Constitutional: Negative for fever. Cardiovascular: Negative for chest pain. Respiratory: Negative for shortness of breath. Gastrointestinal: Negative for abdominal pain, positive for vomiting Genitourinary: Negative for dysuria. Musculoskeletal: Negative for back pain. Skin: Negative for rash. Neurological: Negative for headaches, positive for  generalized weakness  All systems negative/normal/unremarkable except as stated in the HPI  ____________________________________________   PHYSICAL EXAM:  VITAL SIGNS: ED Triage Vitals [11/26/17 1910]  Enc Vitals Group     BP      Pulse      Resp      Temp      Temp src      SpO2      Weight      Height      Head Circumference      Peak Flow      Pain Score 3     Pain Loc      Pain Edu?      Excl. in [place]?    Constitutional: Alert and oriented. Well appearing and in no distress. Eyes: Conjunctivae are normal. Normal extraocular movements. ENT   Head: Normocephalic, large frontal scalp abrasion just below the hairline   Nose: No congestion/rhinnorhea.   Mouth/Throat: Mucous membranes are moist.   Neck: No stridor. Cardiovascular: Normal rate, regular rhythm. No murmurs, rubs, or gallops. Respiratory: Normal respiratory effort without tachypnea nor retractions. Breath sounds are clear and equal bilaterally. No wheezes/rales/rhonchi. Gastrointestinal: Soft and nontender. Normal bowel sounds Musculoskeletal: Nontender with normal range of motion in extremities. No lower extremity tenderness nor edema. Neurologic:  Normal speech and language. No gross focal neurologic deficits are appreciated.  Generalized weakness, nothing focal Skin:  Skin is warm, dry, Pallor is noted abrasion across her forehead Psychiatric: Mood and affect are normal. Speech and behavior are  normal.  ____________________________________________  EKG: Interpreted by me.  Sinus tachycardia with a rate of 118 bpm, normal PR interval, normal QRS, long QT. Repeat EKG reveals likely A. fib with RVR, rate is 164 bpm, normal QRS size, long QT Third repeat EKG reveals rapid A. fib with a rate of 136 bpm, PVC, nonspecific ST changes, normal QT ____________________________________________  ED COURSE:  Pertinent labs & imaging results that were available during my care of the patient were reviewed by  me and considered in my medical decision making (see chart for details). Patient presents for weakness and alcohol abuse, we will assess with labs and imaging as indicated.   Procedures ____________________________________________   LABS (pertinent positives/negatives)  Labs Reviewed  CBC WITH DIFFERENTIAL/PLATELET - Abnormal; Notable for the following components:      Result Value   WBC 16.3 (*)    Hemoglobin 16.4 (*)    HCT 48.2 (*)    MCV 106.4 (*)    MCH 36.2 (*)    RDW 14.6 (*)    Neutro Abs 11.5 (*)    Monocytes Absolute 1.8 (*)    All other components within normal limits  COMPREHENSIVE METABOLIC PANEL - Abnormal; Notable for the following components:   Sodium 130 (*)    Potassium 2.3 (*)    Chloride 80 (*)    CO2 20 (*)    Glucose, Bld 236 (*)    BUN 23 (*)    Creatinine, Ser 1.22 (*)    Calcium 11.3 (*)    AST 216 (*)    ALT 71 (*)    Alkaline Phosphatase 190 (*)    Total Bilirubin 2.3 (*)    GFR calc non Af Amer 47 (*)    GFR calc Af Amer 54 (*)    Anion gap 30 (*)    All other components within normal limits  MAGNESIUM - Abnormal; Notable for the following components:   Magnesium 1.5 (*)    All other components within normal limits  TROPONIN I  ETHANOL  URINALYSIS, COMPLETE (UACMP) WITH MICROSCOPIC  LIPASE, BLOOD  ____________________________________________  CRITICAL CARE Performed by: Earleen Newport   Total critical care time: 30 minutes  Critical care time was exclusive of separately billable procedures and treating other patients.  Critical care was necessary to treat or prevent imminent or life-threatening deterioration.  Critical care was time spent personally by me on the following activities: development of treatment plan with patient and/or surrogate as well as nursing, discussions with consultants, evaluation of patient's response to treatment, examination of patient, obtaining history from patient or surrogate, ordering and  performing treatments and interventions, ordering and review of laboratory studies, ordering and review of radiographic studies, pulse oximetry and re-evaluation of patient's condition.   DIFFERENTIAL DIAGNOSIS   Dehydration, electrolyte abnormality, pancreatitis, anemia, GI bleeding, syncope  FINAL ASSESSMENT AND PLAN  Weakness with chronic alcohol abuse, hypokalemia, dehydration, hypomagnesemia, rapid atrial fibrillation  Plan: Patient had presented for weakness and alcohol abuse. Patient's labs revealed numerous abnormalities with elevated white blood cell count, hyponatremia, hypokalemia, hypochloremia, dehydration, hypomagnesemia, and high anion gap.  This is likely indicative of alcoholic ketoacidosis.  We have started her on multiple saline infusions as well as D5 half-normal saline with 40 mg of potassium and IV magnesium.  She is also received a dose of amiodarone for rapid atrial fibrillation.  Patient will likely need ICU stepdown admission.   Earleen Newport, MD   Note: This note was generated in part or  whole with voice recognition software. Voice recognition is usually quite accurate but there are transcription errors that can and very often do occur. I apologize for any typographical errors that were not detected and corrected.     Earleen Newport, MD 11/26/17 2136

## 2017-11-26 NOTE — ED Notes (Signed)
Family at bedside. 

## 2017-11-26 NOTE — ED Notes (Signed)
Pt to the er for a possible syncopal episode. Pt remembers walking to the bathroom and pt could not get up. Pt did not pass out. Pt family was unable to get her off the toilet. No diarrhea at that time. Pt states she has not urinated since yesterday morning. Pt fell last night and hit her head and shoulder on the floor. Pt is alert and able to answer questions.

## 2017-11-27 ENCOUNTER — Inpatient Hospital Stay
Admit: 2017-11-27 | Discharge: 2017-11-27 | Disposition: A | Discharge status: Home / Self Care | Payer: Self-pay | Attending: Family Medicine | Admitting: Family Medicine

## 2017-11-27 ENCOUNTER — Inpatient Hospital Stay: Payer: Self-pay

## 2017-11-27 ENCOUNTER — Other Ambulatory Visit: Payer: Self-pay

## 2017-11-27 LAB — URINALYSIS, COMPLETE (UACMP) WITH MICROSCOPIC
BACTERIA UA: NONE SEEN
Bilirubin Urine: NEGATIVE
GLUCOSE, UA: NEGATIVE mg/dL
HGB URINE DIPSTICK: NEGATIVE
KETONES UR: NEGATIVE mg/dL
NITRITE: NEGATIVE
PROTEIN: NEGATIVE mg/dL
Specific Gravity, Urine: 1.015 (ref 1.005–1.030)
pH: 5 (ref 5.0–8.0)

## 2017-11-27 LAB — BLOOD GAS, VENOUS
ACID-BASE EXCESS: 9.8 mmol/L — AB (ref 0.0–2.0)
Bicarbonate: 34.3 mmol/L — ABNORMAL HIGH (ref 20.0–28.0)
O2 Saturation: 79.2 %
PATIENT TEMPERATURE: 37
PH VEN: 7.5 — AB (ref 7.250–7.430)
pCO2, Ven: 44 mmHg (ref 44.0–60.0)
pO2, Ven: 39 mmHg (ref 32.0–45.0)

## 2017-11-27 LAB — ECHOCARDIOGRAM COMPLETE
Area-P 1/2: 3.86 cm2
EERAT: 6.72
EWDT: 194 ms
FS: 34 % (ref 28–44)
HEIGHTINCHES: 70 in
IV/PV OW: 1.07
LA ID, A-P, ES: 38 mm
LA vol index: 15.8 mL/m2
LADIAMINDEX: 2.05 cm/m2
LAVOL: 29.3 mL
LAVOLA4C: 25.7 mL
LEFT ATRIUM END SYS DIAM: 38 mm
LV E/e' medial: 6.72
LV E/e'average: 6.72
LV PW d: 7.92 mm — AB (ref 0.6–1.1)
LV TDI E'MEDIAL: 6.85
LV e' LATERAL: 10.1 cm/s
Lateral S' vel: 12.7 cm/s
MV Dec: 194
MV pk A vel: 81.5 m/s
MV pk E vel: 67.9 m/s
P 1/2 time: 57 ms
RV TAPSE: 26.5 mm
TDI e' lateral: 10.1
WEIGHTICAEL: 2467.2 [oz_av]

## 2017-11-27 LAB — BASIC METABOLIC PANEL
Anion gap: 12 (ref 5–15)
BUN: 22 mg/dL — AB (ref 6–20)
CHLORIDE: 94 mmol/L — AB (ref 101–111)
CO2: 28 mmol/L (ref 22–32)
Calcium: 9.2 mg/dL (ref 8.9–10.3)
Creatinine, Ser: 1.1 mg/dL — ABNORMAL HIGH (ref 0.44–1.00)
GFR calc Af Amer: >60 mL/min (ref >60)
GFR calc non Af Amer: 53 mL/min — ABNORMAL LOW (ref >60)
GLUCOSE: 132 mg/dL — AB (ref 65–99)
POTASSIUM: 3 mmol/L — AB (ref 3.5–5.1)
Sodium: 134 mmol/L — ABNORMAL LOW (ref 135–145)

## 2017-11-27 LAB — TROPONIN I
Troponin I: <0.03 ng/mL (ref <0.03)
Troponin I: <0.03 ng/mL (ref <0.03)

## 2017-11-27 LAB — CBC
HEMATOCRIT: 38.6 % (ref 35.0–47.0)
Hemoglobin: 13.3 g/dL (ref 12.0–16.0)
MCH: 35.8 pg — ABNORMAL HIGH (ref 26.0–34.0)
MCHC: 34.5 g/dL (ref 32.0–36.0)
MCV: 103.9 fL — AB (ref 80.0–100.0)
Platelets: 261 10*3/uL (ref 150–440)
RBC: 3.72 MIL/uL — ABNORMAL LOW (ref 3.80–5.20)
RDW: 14.3 % (ref 11.5–14.5)
WBC: 12.5 10*3/uL — ABNORMAL HIGH (ref 3.6–11.0)

## 2017-11-27 LAB — PROTIME-INR
INR: 1.01
Prothrombin Time: 13.2 seconds (ref 11.4–15.2)

## 2017-11-27 LAB — APTT: aPTT: 28 seconds (ref 24–36)

## 2017-11-27 LAB — MAGNESIUM: MAGNESIUM: 1.8 mg/dL (ref 1.7–2.4)

## 2017-11-27 MED ORDER — PANTOPRAZOLE SODIUM 40 MG IV SOLR
40.0000 mg | INTRAVENOUS | Status: DC
  Administered 2017-11-27 – 2017-11-29 (×3): 40 mg via INTRAVENOUS
  Filled 2017-11-27 (×3): qty 40

## 2017-11-27 MED ORDER — ONDANSETRON HCL 4 MG PO TABS: 4.0000 mg | ORAL_TABLET | Freq: Four times a day (QID) | ORAL | Status: DC | PRN

## 2017-11-27 MED ORDER — FLUTICASONE PROPIONATE 50 MCG/ACT NA SUSP
1.0000 | Freq: Every day | NASAL | Status: DC
  Filled 2017-11-27: qty 16

## 2017-11-27 MED ORDER — POLYETHYLENE GLYCOL 3350 17 G PO PACK: 17.0000 g | PACK | Freq: Every day | ORAL | Status: DC | PRN

## 2017-11-27 MED ORDER — ONDANSETRON HCL 4 MG/2ML IJ SOLN
4.0000 mg | Freq: Four times a day (QID) | INTRAMUSCULAR | Status: DC | PRN
  Administered 2017-11-27: 4 mg via INTRAVENOUS
  Filled 2017-11-27: qty 2

## 2017-11-27 MED ORDER — ASPIRIN EC 81 MG PO TBEC
81.0000 mg | DELAYED_RELEASE_TABLET | Freq: Every day | ORAL | Status: DC
  Administered 2017-11-27 – 2017-11-29 (×3): 81 mg via ORAL
  Filled 2017-11-27 (×3): qty 1

## 2017-11-27 MED ORDER — SODIUM CHLORIDE 0.9% FLUSH
3.0000 mL | Freq: Two times a day (BID) | INTRAVENOUS | Status: DC
  Administered 2017-11-27 – 2017-11-29 (×5): 3 mL via INTRAVENOUS

## 2017-11-27 MED ORDER — METOPROLOL TARTRATE 25 MG PO TABS
25.0000 mg | ORAL_TABLET | Freq: Two times a day (BID) | ORAL | Status: DC
  Administered 2017-11-27: 25 mg via ORAL
  Filled 2017-11-27 (×3): qty 1

## 2017-11-27 MED ORDER — LORAZEPAM 2 MG/ML IJ SOLN: 1.0000 mg | Freq: Four times a day (QID) | INTRAMUSCULAR | Status: DC | PRN

## 2017-11-27 MED ORDER — THIAMINE HCL 100 MG/ML IJ SOLN: 100.0000 mg | Freq: Every day | INTRAMUSCULAR | Status: DC

## 2017-11-27 MED ORDER — SODIUM CHLORIDE 1 G PO TABS
2.0000 g | ORAL_TABLET | Freq: Three times a day (TID) | ORAL | Status: AC
  Administered 2017-11-27 (×2): 2 g via ORAL
  Filled 2017-11-27 (×3): qty 2

## 2017-11-27 MED ORDER — ENOXAPARIN SODIUM 80 MG/0.8ML ~~LOC~~ SOLN
70.0000 mg | Freq: Two times a day (BID) | SUBCUTANEOUS | Status: DC
  Administered 2017-11-27: 70 mg via SUBCUTANEOUS
  Filled 2017-11-27: qty 0.8

## 2017-11-27 MED ORDER — TRAZODONE HCL 100 MG PO TABS
100.0000 mg | ORAL_TABLET | Freq: Every evening | ORAL | Status: DC | PRN
  Administered 2017-11-27 – 2017-11-28 (×2): 100 mg via ORAL
  Filled 2017-11-27 (×4): qty 1

## 2017-11-27 MED ORDER — FOLIC ACID 1 MG PO TABS
1.0000 mg | ORAL_TABLET | Freq: Every day | ORAL | Status: DC
  Administered 2017-11-27 – 2017-11-29 (×3): 1 mg via ORAL
  Filled 2017-11-27 (×3): qty 1

## 2017-11-27 MED ORDER — LORAZEPAM 1 MG PO TABS: 1.0000 mg | ORAL_TABLET | Freq: Four times a day (QID) | ORAL | Status: DC | PRN

## 2017-11-27 MED ORDER — ACETAMINOPHEN 650 MG RE SUPP: 650.0000 mg | Freq: Four times a day (QID) | RECTAL | Status: DC | PRN

## 2017-11-27 MED ORDER — MAGNESIUM SULFATE 2 GM/50ML IV SOLN
2.0000 g | Freq: Once | INTRAVENOUS | Status: AC
  Administered 2017-11-27: 2 g via INTRAVENOUS
  Filled 2017-11-27: qty 50

## 2017-11-27 MED ORDER — ACETAMINOPHEN 325 MG PO TABS
650.0000 mg | ORAL_TABLET | Freq: Four times a day (QID) | ORAL | Status: DC | PRN
  Administered 2017-11-28: 650 mg via ORAL
  Filled 2017-11-27 (×2): qty 2

## 2017-11-27 MED ORDER — VITAMIN B-1 100 MG PO TABS
100.0000 mg | ORAL_TABLET | Freq: Every day | ORAL | Status: DC
  Administered 2017-11-27 – 2017-11-29 (×3): 100 mg via ORAL
  Filled 2017-11-27 (×3): qty 1

## 2017-11-27 MED ORDER — TEMAZEPAM 15 MG PO CAPS
15.0000 mg | ORAL_CAPSULE | Freq: Every evening | ORAL | Status: DC | PRN
  Filled 2017-11-27 (×2): qty 1

## 2017-11-27 MED ORDER — HYDROCODONE-ACETAMINOPHEN 5-325 MG PO TABS: 1.0000 | ORAL_TABLET | ORAL | Status: DC | PRN

## 2017-11-27 MED ORDER — ADULT MULTIVITAMIN W/MINERALS CH
1.0000 | ORAL_TABLET | Freq: Every day | ORAL | Status: DC
  Administered 2017-11-27 – 2017-11-29 (×3): 1 via ORAL
  Filled 2017-11-27 (×3): qty 1

## 2017-11-27 MED ORDER — POTASSIUM CHLORIDE CRYS ER 20 MEQ PO TBCR
40.0000 meq | EXTENDED_RELEASE_TABLET | Freq: Once | ORAL | Status: AC
  Administered 2017-11-27: 40 meq via ORAL
  Filled 2017-11-27: qty 2

## 2017-11-27 MED ORDER — VENLAFAXINE HCL ER 75 MG PO CP24
225.0000 mg | ORAL_CAPSULE | Freq: Every day | ORAL | Status: DC
  Administered 2017-11-27 – 2017-11-29 (×3): 225 mg via ORAL
  Filled 2017-11-27 (×3): qty 3

## 2017-11-27 MED ORDER — POTASSIUM CHLORIDE IN NACL 40-0.9 MEQ/L-% IV SOLN
INTRAVENOUS | Status: DC
  Administered 2017-11-27: 100 mL/h via INTRAVENOUS
  Filled 2017-11-27 (×3): qty 1000

## 2017-11-27 NOTE — Consult Note (Signed)
[name]  CARDIOLOGY CONSULT NOTE  Patient ID: [name] MRN: [id number] DOB/AGE: [birth year] 61 y.o.  Admit date: 11/26/2017 Referring Physician Bridgett Larsson Primary Physician Primary Cardiologist  Reason for Consultation atrial fibrillation  HPI: 61 year old female referred for evaluation of atrial fibrillation.  Patient has known history of alcohol abuse, alcohol induced pancreatitis, who presents with 2-3-day history of nausea, vomiting, generalized weakness, and inability to stand.  In the emergency room patient was noted to be in SVT, treated with amiodarone bolus, repeat EKG revealing atrial fibrillation with rapid ventricular rate.  Admission labs were notable for potassium of 2.3, white count 16,000, troponin less than 0.03.  The patient was given a single IV dose of digoxin due to low blood pressure.  The patient was admitted to telemetry where she has had a uncomplicated [name] course following IV fluids and potassium repletion.  The patient has spontaneous converted to sinus rhythm at 90 bpm.  Review of systems complete and found to be negative unless listed above     Past Medical History:  Diagnosis Date  . Cancer ([place])    skin  . Depression   . ETOH abuse     Past Surgical History:  Procedure Laterality Date  . MOLE REMOVAL      Medications Prior to Admission  Medication Sig Dispense Refill Last Dose  . venlafaxine XR (EFFEXOR-XR) 75 MG 24 hr capsule Take 225 mg by mouth daily.    11/25/2017 at AM  . NASONEX 50 MCG/ACT nasal spray Place 2 sprays into both nostrils daily.  3 PRN at PRN  . ranitidine (ZANTAC) 150 MG tablet Take 150 mg by mouth 2 (two) times daily.   PRN at PRN  . traZODone (DESYREL) 100 MG tablet Take 100 mg by mouth at bedtime as needed. for sleep  2 PRN at PRN   Social History   Socioeconomic History  . Marital status: Single    Spouse name: Not on file  . Number of children: Not on file  . Years of education: Not on file  . Highest education  level: Not on file  Social Needs  . Financial resource strain: Not on file  . Food insecurity - worry: Not on file  . Food insecurity - inability: Not on file  . Transportation needs - medical: Not on file  . Transportation needs - non-medical: Not on file  Occupational History  . Not on file  Tobacco Use  . Smoking status: Current Every Day Smoker    Packs/day: 0.25    Types: Cigarettes  . Smokeless tobacco: Never Used  Substance and Sexual Activity  . Alcohol use: Yes    Alcohol/week: 0.6 oz    Types: 1 Shots of liquor per week  . Drug use: No  . Sexual activity: No  Other Topics Concern  . Not on file  Social History Narrative  . Not on file    Family History  Problem Relation Age of Onset  . Breast cancer Neg Hx       Review of systems complete and found to be negative unless listed above      PHYSICAL EXAM  General: Well developed, well nourished, in no acute distress HEENT:  Normocephalic and atramatic Neck:  No JVD.  Lungs: Clear bilaterally to auscultation and percussion. Heart: HRRR . Normal S1 and S2 without gallops or murmurs.  Abdomen: Bowel sounds are positive, abdomen soft and non-tender  Msk:  Back normal, normal gait. Normal strength and tone for age. Extremities:  No clubbing, cyanosis or edema.   Neuro: Alert and oriented X 3. Psych:  Good affect, responds appropriately  Labs:   Lab Results  Component Value Date   WBC 12.5 (H) 11/27/2017   HGB 13.3 11/27/2017   HCT 38.6 11/27/2017   MCV 103.9 (H) 11/27/2017   PLT 261 11/27/2017    Recent Labs  Lab 11/26/17 1918 11/27/17 0140  NA 130* 134*  K 2.3* 3.0*  CL 80* 94*  CO2 20* 28  BUN 23* 22*  CREATININE 1.22* 1.10*  CALCIUM 11.3* 9.2  PROT 8.0  --   BILITOT 2.3*  --   ALKPHOS 190*  --   ALT 71*  --   AST 216*  --   GLUCOSE 236* 132*   Lab Results  Component Value Date   TROPONINI <0.03 11/27/2017   No results found for: CHOL No results found for: HDL No results found for:  LDLCALC No results found for: TRIG No results found for: CHOLHDL No results found for: LDLDIRECT    Radiology: Dg Abd 2 Views  Result Date: 11/27/2017 CLINICAL DATA:  Possible syncopal episode. EXAM: ABDOMEN - 2 VIEW COMPARISON:  None. FINDINGS: The bowel gas pattern is normal. There is no evidence of free air. No radio-opaque calculi or other significant radiographic abnormality is seen. IMPRESSION: Negative. Electronically Signed   By: Dorise Bullion III M.D   On: 11/27/2017 00:34    EKG: Atrial fibrillation with rapid ventricular rate  ASSESSMENT AND PLAN:   1.  Atrial fibrillation with rapid ventricular rate, in the setting of severe hypokalemia, poor nutrition, alcohol abuse, converted to sinus rhythm after intravenous fluid resuscitation and potassium repletion, chads vas score of 1.  Atrial fibrillation likely due to reversible cause as mentioned above, and patient poor candidate for chronic long-term chronic anticoagulation with history of medical noncompliance, and ongoing history of alcohol abuse.  Recommendations  1.  DC Lovenox 2.  Defer long-term chronic anticoagulation 3.  Start low-dose aspirin 4.  Metoprolol tartrate 25 mg twice daily 5.  Strongly encouraged patient to abstain from alcohol 6.  No further cardiac diagnostics are required at this time  Sign off for now, please call if any questions  Signed: Isaias Cowman MD,PhD, [facility] 11/27/2017, 8:53 AM

## 2017-11-27 NOTE — Progress Notes (Addendum)
[facility] [place] NAME: [name]    MR#:  [id number]  DATE OF BIRTH:  May 09, 1956  SUBJECTIVE:  CHIEF COMPLAINT:   Chief Complaint  Patient presents with  . Loss of Consciousness   Generalized weakness. REVIEW OF SYSTEMS:  Review of Systems  Constitutional: Positive for malaise/fatigue. Negative for chills and fever.  HENT: Negative for sore throat.   Eyes: Negative for blurred vision and double vision.  Respiratory: Negative for cough, hemoptysis, shortness of breath, wheezing and stridor.   Cardiovascular: Negative for chest pain, palpitations, orthopnea and leg swelling.  Gastrointestinal: Negative for abdominal pain, blood in stool, diarrhea, melena, nausea and vomiting.  Genitourinary: Negative for dysuria, flank pain and hematuria.  Musculoskeletal: Negative for back pain and joint pain.  Skin: Negative for rash.  Neurological: Positive for weakness. Negative for dizziness, tremors, sensory change, focal weakness, seizures, loss of consciousness and headaches.  Endo/Heme/Allergies: Negative for polydipsia.  Psychiatric/Behavioral: Negative for depression. The patient is not nervous/anxious.     DRUG ALLERGIES:  No Known Allergies VITALS:  Blood pressure (!) 105/59, pulse 92, temperature 98.6 F (37 C), temperature source Oral, resp. rate 14, height 5\' 10"  (1.778 m), weight 154 lb 3.2 oz (69.9 kg), SpO2 100 %. PHYSICAL EXAMINATION:  Physical Exam  Constitutional: She is oriented to person, place, and time and well-developed, well-nourished, and in no distress.  HENT:  Head: Normocephalic.  Mouth/Throat: Oropharynx is clear and moist.  Eyes: Conjunctivae and EOM are normal. Pupils are equal, round, and reactive to light. No scleral icterus.  Neck: Normal range of motion. Neck supple. No JVD present. No tracheal deviation present.  Cardiovascular: Normal rate, regular rhythm and normal heart sounds. Exam reveals no  gallop.  No murmur heard. Pulmonary/Chest: Effort normal and breath sounds normal. No respiratory distress. She has no wheezes. She has no rales.  Abdominal: Soft. Bowel sounds are normal. She exhibits no distension. There is no tenderness. There is no rebound.  Musculoskeletal: Normal range of motion. She exhibits no edema or tenderness.  Neurological: She is alert and oriented to person, place, and time. No cranial nerve deficit.  Skin: No rash noted. No erythema.  Psychiatric: Affect normal.   LABORATORY PANEL:  Female CBC Recent Labs  Lab 11/27/17 0140  WBC 12.5*  HGB 13.3  HCT 38.6  PLT 261   ------------------------------------------------------------------------------------------------------------------ Chemistries  Recent Labs  Lab 11/26/17 1918 11/27/17 0140 11/27/17 0659  NA 130* 134*  --   K 2.3* 3.0*  --   CL 80* 94*  --   CO2 20* 28  --   GLUCOSE 236* 132*  --   BUN 23* 22*  --   CREATININE 1.22* 1.10*  --   CALCIUM 11.3* 9.2  --   MG 1.5*  --  1.8  AST 216*  --   --   ALT 71*  --   --   ALKPHOS 190*  --   --   BILITOT 2.3*  --   --    RADIOLOGY:  Dg Abd 2 Views  Result Date: 11/27/2017 CLINICAL DATA:  Possible syncopal episode. EXAM: ABDOMEN - 2 VIEW COMPARISON:  None. FINDINGS: The bowel gas pattern is normal. There is no evidence of free air. No radio-opaque calculi or other significant radiographic abnormality is seen. IMPRESSION: Negative. Electronically Signed   By: Dorise Bullion III M.D   On: 11/27/2017 00:34   US Abdomen Limited Ruq  Result Date: 11/27/2017 CLINICAL  DATA:  Cirrhosis.  Ascites. EXAM: ULTRASOUND ABDOMEN LIMITED RIGHT UPPER QUADRANT COMPARISON:  CT 09/26/2016. FINDINGS: Gallbladder: Echogenic sludge dependent within the gallbladder. No shadowing stones. No wall thickening or surrounding fluid. Negative Murphy sign. Common bile duct: Diameter: 2.4 mm common normal Liver: Diffusely increased echogenicity. No focal lesions. No ductal  dilatation. Portal vein is patent on color Doppler imaging with normal direction of blood flow towards the liver. IMPRESSION: Diffuse echogenic change of the liver consistent with steatosis. No evidence of focal lesion. Sludge in the gallbladder without evidence of stone or ductal dilatation. Electronically Signed   By: Nelson Chimes M.D.   On: 11/27/2017 09:33   ASSESSMENT AND PLAN:   1 acute new onset A. fib with RVR  Converted to sinus rhythm after intravenous fluid resuscitation and potassium repletion, chads vas score of 1. Start low-dose aspirin, Lopressor 25 mg p.o. twice daily, discontinue Lovenox and defer long-term chronic anticoagulation per Dr. Saralyn Pilar.  2 chronic alcohol abuse/dependency On alcohol withdrawal protocol  3 acute numerous electrolyte abnormalities-hyponatremia/hypokalemia/hypochloremia/hypo-bicarbonate/hypomagnesemia Secondary to alcohol abuse and nausea/emesis Improving with supplement and normal saline IV.  4 chronic depression Stable Continue home regiment  5 acute constipation Colace twice daily,dulcolax prn.  Tobacco abuse.  Smoking cessation was counseled for 4 minutes.  Generalized weakness.  PT evaluation.  I discussed with Dr. Saralyn Pilar. All the records are reviewed and case discussed with Care Management/Social Worker. Management plans discussed with the patient, family and they are in agreement.  CODE STATUS: Full Code  TOTAL TIME TAKING CARE OF THIS PATIENT: 35 minutes.   More than 50% of the time was spent in counseling/coordination of care: YES  POSSIBLE D/C IN 1-2 DAYS, DEPENDING ON CLINICAL CONDITION.   [name] M.D on 11/27/2017 at 12:41 PM  Between 7am to 6pm - Pager - [phone]  After 6pm go to [email or web address] - [facility]

## 2017-11-27 NOTE — Progress Notes (Signed)
Notified MD of 2 orders of IV fluids. Orders placed. Will continue to monitor and assess.

## 2017-11-27 NOTE — ED Notes (Signed)
Attempted to hang second bag of potassium at rate of 168ml/hr. Pt unable to tolerate. Told pt we could hold off for a while.

## 2017-11-27 NOTE — Plan of Care (Signed)
  Progressing Education: Knowledge of General Education information will improve 11/27/2017 0528 - Progressing by Jeri Cos, RN Clinical Measurements: Diagnostic test results will improve 11/27/2017 0528 - Progressing by Jeri Cos, RN Cardiovascular complication will be avoided 11/27/2017 0528 - Progressing by Jeri Cos, RN Activity: Risk for activity intolerance will decrease 11/27/2017 0528 - Progressing by Jeri Cos, RN

## 2017-11-28 LAB — BASIC METABOLIC PANEL
ANION GAP: 10 (ref 5–15)
BUN: 17 mg/dL (ref 6–20)
CALCIUM: 9.4 mg/dL (ref 8.9–10.3)
CO2: 27 mmol/L (ref 22–32)
Chloride: 100 mmol/L — ABNORMAL LOW (ref 101–111)
Creatinine, Ser: 1.02 mg/dL — ABNORMAL HIGH (ref 0.44–1.00)
GFR, EST NON AFRICAN AMERICAN: 58 mL/min — AB (ref >60)
Glucose, Bld: 90 mg/dL (ref 65–99)
POTASSIUM: 3.5 mmol/L (ref 3.5–5.1)
SODIUM: 137 mmol/L (ref 135–145)

## 2017-11-28 IMAGING — CT CT ABD-PELV W/ CM
2 of 5 series · 16 of 46 positions shown, 18 images · IV contrast (APPLIED)
Comparison: None.

CLINICAL DATA: Vomiting and epigastric pain over the last week.

EXAM:
CT ABDOMEN AND PELVIS WITH CONTRAST
TECHNIQUE: Multidetector CT imaging of the abdomen and pelvis was performed
using the standard protocol following bolus administration of
intravenous contrast.
CONTRAST:  75mL [id number] IOPAMIDOL ([id number]) INJECTION 61%

[Series 2: axial st · axial · 0.75mm/px · z∈[-834,-409]mm · 13 of 97 slices shown, 15 images]
[im 6/97  soft-tissue]
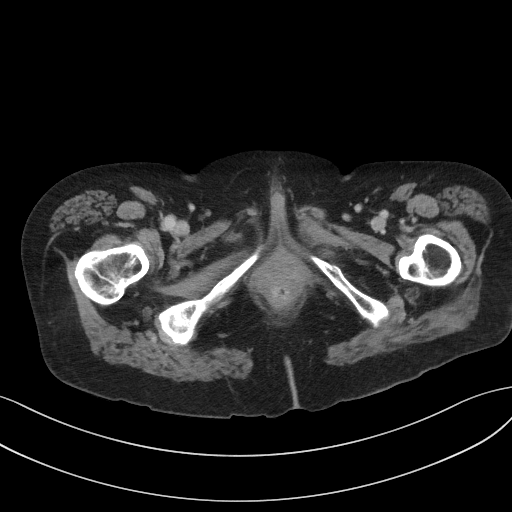
[im 6/97  bone]
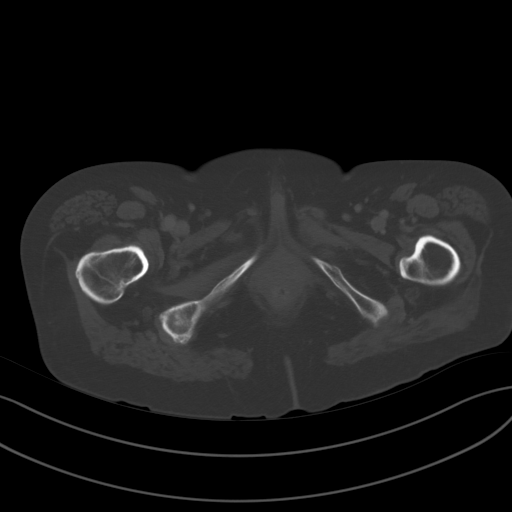
[im 11/97  soft-tissue]
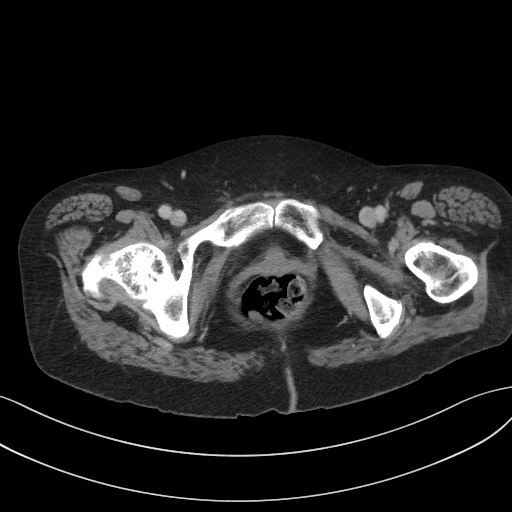
[im 22/97  soft-tissue]
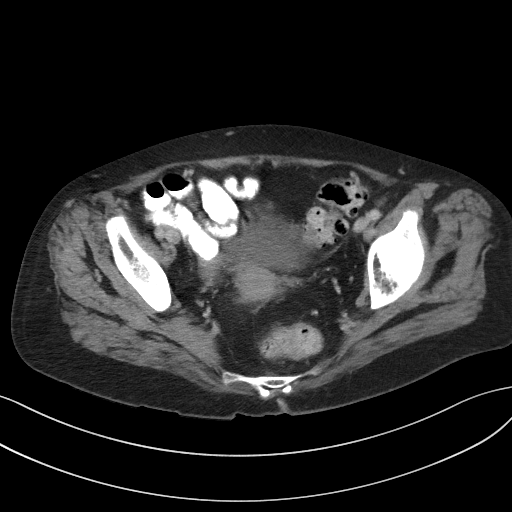
[im 27/97  soft-tissue]
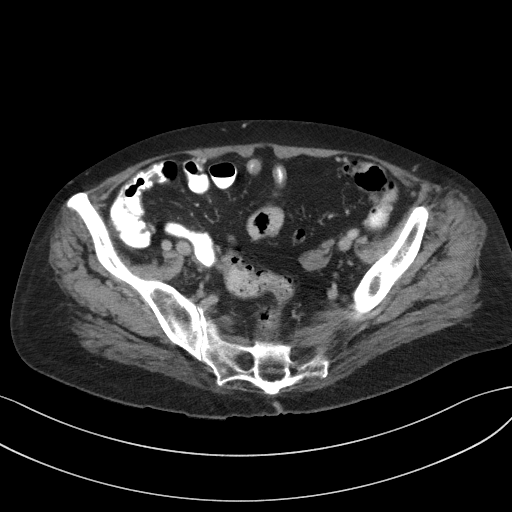
[im 33/97  soft-tissue]
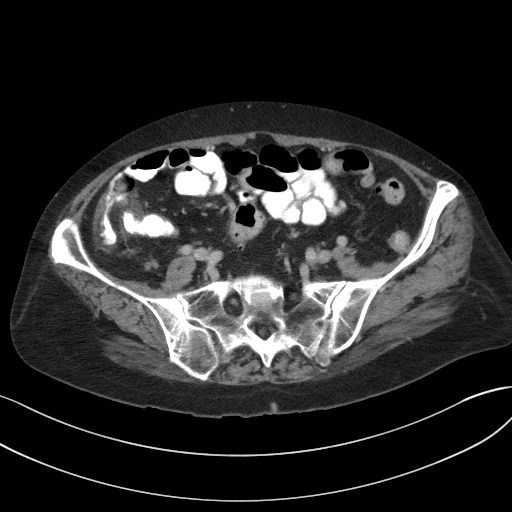
[im 43/97  soft-tissue]
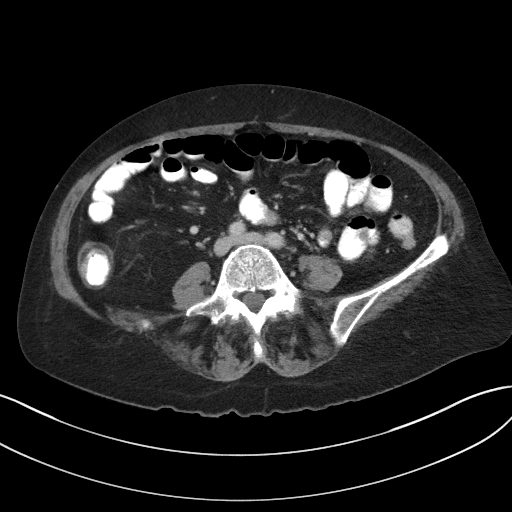
[im 49/97  soft-tissue]
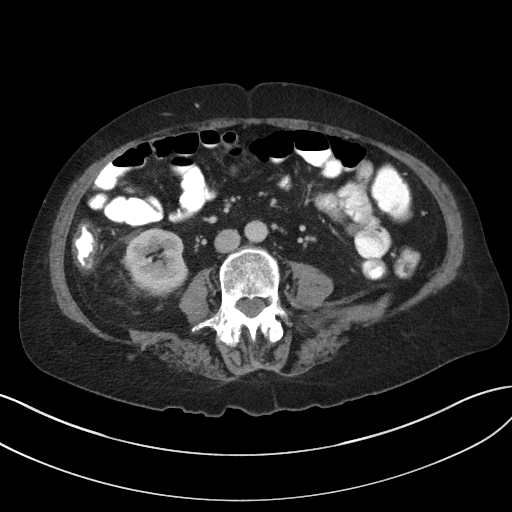
[im 54/97  soft-tissue]
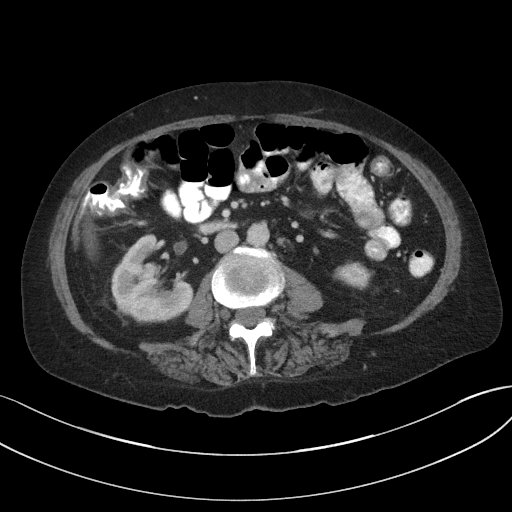
[im 65/97  soft-tissue]
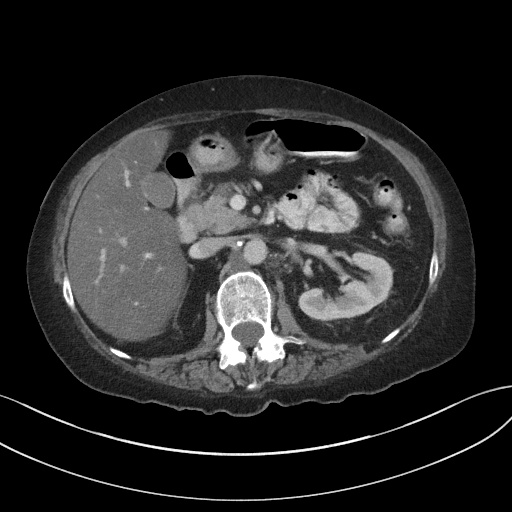
[im 65/97  bone]
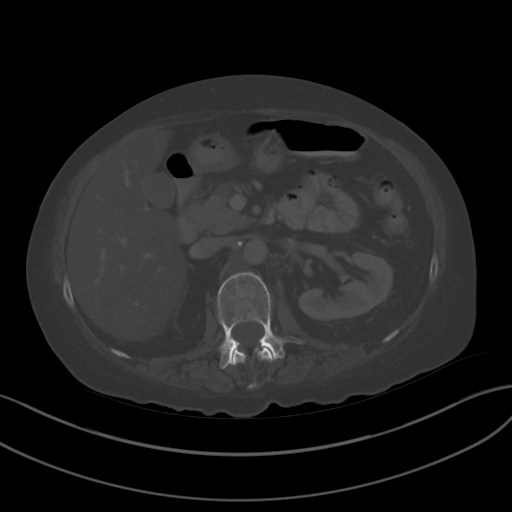
[im 70/97  soft-tissue]
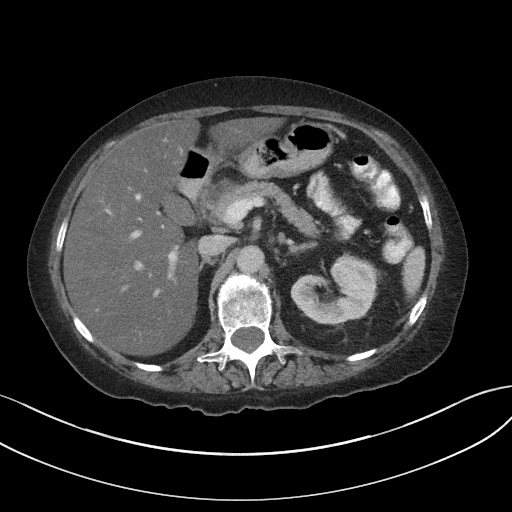
[im 75/97  soft-tissue]
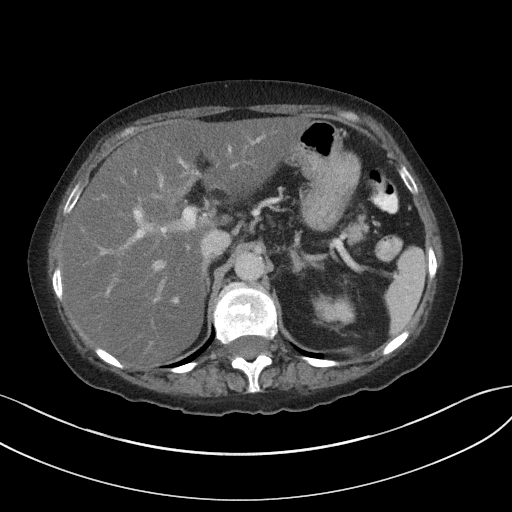
[im 86/97  soft-tissue]
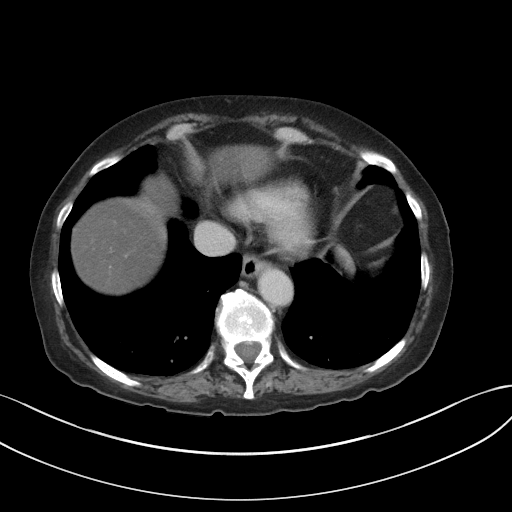
[im 91/97  soft-tissue]
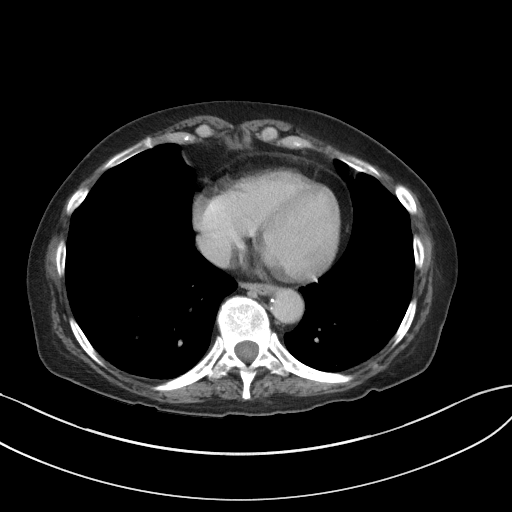

[Series 5: coronal st · coronal · 0.68mm/px · 3 of 83 slices shown]
[im 28/83  soft-tissue]
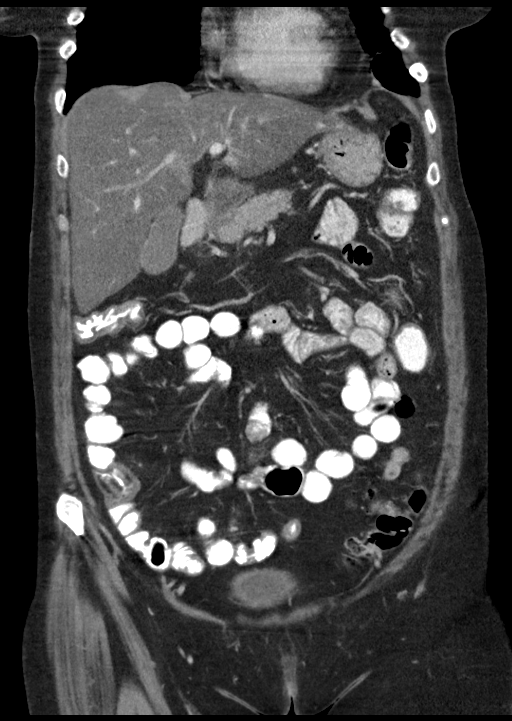
[im 37/83  soft-tissue]
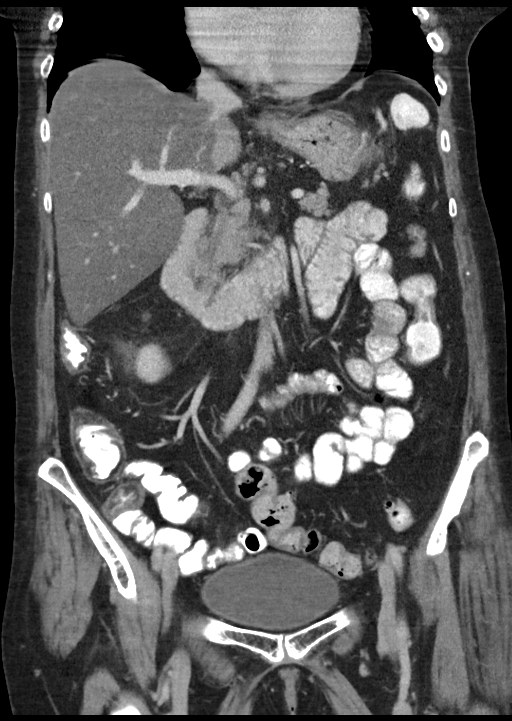
[im 46/83  soft-tissue]
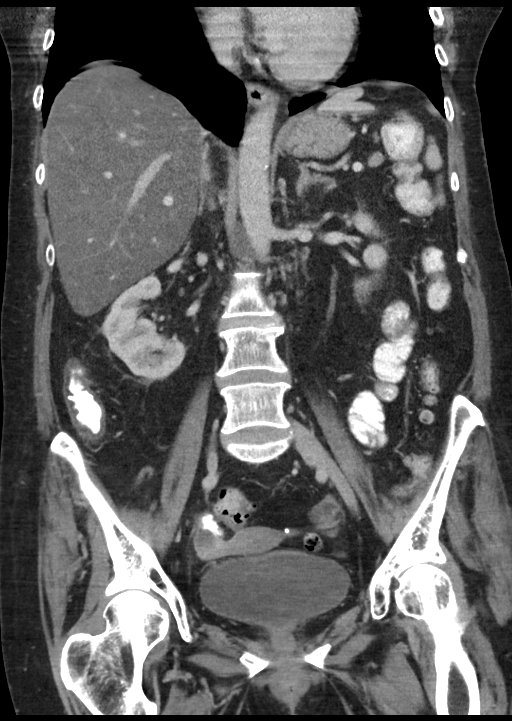

[16 of 46 positions shown; findings below may reference images not displayed]

FINDINGS: Lower chest: Normal

Hepatobiliary: Profound diffuse fatty change of the liver. No
calcified gallstones.

Pancreas: There is mild edema adjacent to the anterior surface of
the pancreas at the junction of the pancreatic head and body. This
could be evidence of low level pancreatitis. The remainder of the
organ appears normal.

Spleen: Normal

Adrenals/Urinary Tract: The adrenal glands are normal. Both kidneys
appear intrinsically normal except for 2 small benign cyst ventrally
on the right. No bladder abnormality.

Stomach/Bowel: No acute bowel pathology. There is diverticulosis
without evidence of diverticulitis.

Vascular/Lymphatic: Mild aortic atherosclerosis.  No aneurysm.

Reproductive: Uterus and adnexal regions are normal for age.

Other: No ascites or free air.

Musculoskeletal: Ordinary degenerative changes affect the spine.
IMPRESSION: Profound diffuse fatty change of the liver which can be symptomatic.

Edema adjacent to the pancreas at the junction of the head and body
suggesting low level pancreatitis.

Aortic atherosclerosis.

## 2017-11-28 MED ORDER — METOPROLOL TARTRATE 25 MG PO TABS
12.5000 mg | ORAL_TABLET | Freq: Two times a day (BID) | ORAL | Status: DC
  Administered 2017-11-29: 12.5 mg via ORAL
  Filled 2017-11-28 (×2): qty 1

## 2017-11-28 MED ORDER — METOPROLOL TARTRATE 25 MG PO TABS: 12.5000 mg | ORAL_TABLET | Freq: Two times a day (BID) | ORAL | 1 refills | Status: DC

## 2017-11-28 MED ORDER — METOPROLOL TARTRATE 25 MG PO TABS: 25.0000 mg | ORAL_TABLET | Freq: Two times a day (BID) | ORAL | 1 refills | Status: DC

## 2017-11-28 MED ORDER — ENSURE ENLIVE PO LIQD
237.0000 mL | Freq: Two times a day (BID) | ORAL | Status: DC
  Administered 2017-11-28 – 2017-11-29 (×3): 237 mL via ORAL

## 2017-11-28 MED ORDER — ASPIRIN 81 MG PO TBEC: 81.0000 mg | DELAYED_RELEASE_TABLET | Freq: Every day | ORAL | 2 refills | Status: DC

## 2017-11-28 NOTE — Discharge Summary (Signed)
[facility] [place] NAME: [name]    MR#:  [id number]  DATE OF BIRTH:  07/05/1956  DATE OF ADMISSION:  11/26/2017   ADMITTING PHYSICIAN: Gorden Harms, MD  DATE OF DISCHARGE: 11/28/2017  PRIMARY CARE PHYSICIAN: Soles, Howell Rucks, MD   ADMISSION DIAGNOSIS:  Alcoholic ketoacidosis [X73.5] Emesis [R11.10] Cirrhosis ([place]) [K74.60] Atrial fibrillation with rapid ventricular response ([place]) [I48.91] DISCHARGE DIAGNOSIS:  Active Problems:   A-fib ([place])  SECONDARY DIAGNOSIS:   Past Medical History:  Diagnosis Date  . Cancer ([place])    skin  . Depression   . ETOH abuse    HOSPITAL COURSE:  1acute new onset A. fib with RVR  Converted to sinus rhythm after intravenous fluid resuscitation and potassium repletion, chads vas score of 1. Started low-dose aspirin, Lopressor 25 mg p.o. twice daily, discontinued Lovenox and defer long-term chronic anticoagulation per Dr. Saralyn Pilar. Lopressor is decreased to 12.5 mg p.o. twice a day due to low BP and orthostatic hypotension.  2chronic alcohol abuse/dependency On alcohol withdrawal protocol  3acute numerous electrolyte abnormalities-hyponatremia/hypokalemia/hypochloremia/hypo-bicarbonate/hypomagnesemia Secondary to alcohol abuse and nausea/emesis Improving with supplement and normal saline IV.  4chronic depression Stable Continue home regiment  5acute constipation Colace twice daily,dulcolax prn.  Tobacco abuse.  Smoking cessation was counseled for 4 minutes.  Generalized weakness.  PT evaluation:HHPT.  DISCHARGE CONDITIONS:  Stable.  The patient will be discharged to home with home health and PT. CONSULTS OBTAINED:  Treatment Team:  Isaias Cowman, MD DRUG ALLERGIES:  No Known Allergies DISCHARGE MEDICATIONS:   Allergies as of 11/28/2017   No Known Allergies     Medication List    TAKE these medications   aspirin 81 MG EC tablet Take 1 tablet  (81 mg total) by mouth daily. Start taking on:  11/29/2017   metoprolol tartrate 25 MG tablet Commonly known as:  LOPRESSOR Take 0.5 tablets (12.5 mg total) by mouth 2 (two) times daily.   NASONEX 50 MCG/ACT nasal spray Generic drug:  mometasone Place 2 sprays into both nostrils daily.   ranitidine 150 MG tablet Commonly known as:  ZANTAC Take 150 mg by mouth 2 (two) times daily.   traZODone 100 MG tablet Commonly known as:  DESYREL Take 100 mg by mouth at bedtime as needed. for sleep   venlafaxine XR 75 MG 24 hr capsule Commonly known as:  EFFEXOR-XR Take 225 mg by mouth daily.        DISCHARGE INSTRUCTIONS:  See AVS. If you experience worsening of your admission symptoms, develop shortness of breath, life threatening emergency, suicidal or homicidal thoughts you must seek medical attention immediately by calling 911 or calling your MD immediately  if symptoms less severe.  You Must read complete instructions/literature along with all the possible adverse reactions/side effects for all the Medicines you take and that have been prescribed to you. Take any new Medicines after you have completely understood and accpet all the possible adverse reactions/side effects.   Please note  You were cared for by a hospitalist during your hospital stay. If you have any questions about your discharge medications or the care you received while you were in the hospital after you are discharged, you can call the unit and asked to speak with the hospitalist on call if the hospitalist that took care of you is not available. Once you are discharged, your primary care physician will handle any further medical issues. Please note that NO REFILLS for any discharge medications  will be authorized once you are discharged, as it is imperative that you return to your primary care physician (or establish a relationship with a primary care physician if you do not have one) for your aftercare needs so that they  can reassess your need for medications and monitor your lab values.    On the day of Discharge:  VITAL SIGNS:  Blood pressure 93/67, pulse (!) 114, temperature 97.8 F (36.6 C), temperature source Oral, resp. rate 18, height 5\' 10"  (1.778 m), weight 154 lb 3.2 oz (69.9 kg), SpO2 99 %. PHYSICAL EXAMINATION:  GENERAL:  61 y.o.-year-old patient lying in the bed with no acute distress.  EYES: Pupils equal, round, reactive to light and accommodation. No scleral icterus. Extraocular muscles intact.  HEENT: Head atraumatic, normocephalic. Oropharynx and nasopharynx clear.  NECK:  Supple, no jugular venous distention. No thyroid enlargement, no tenderness.  LUNGS: Normal breath sounds bilaterally, no wheezing, rales,rhonchi or crepitation. No use of accessory muscles of respiration.  CARDIOVASCULAR: S1, S2 normal. No murmurs, rubs, or gallops.  ABDOMEN: Soft, non-tender, non-distended. Bowel sounds present. No organomegaly or mass.  EXTREMITIES: No pedal edema, cyanosis, or clubbing.  NEUROLOGIC: Cranial nerves II through XII are intact. Muscle strength 5/5 in all extremities. Sensation intact. Gait not checked.  PSYCHIATRIC: The patient is alert and oriented x 3.  SKIN: No obvious rash, lesion, or ulcer.  DATA REVIEW:   CBC Recent Labs  Lab 11/27/17 0140  WBC 12.5*  HGB 13.3  HCT 38.6  PLT 261    Chemistries  Recent Labs  Lab 11/26/17 1918  11/27/17 0659 11/28/17 0605  NA 130*   < >  --  137  K 2.3*   < >  --  3.5  CL 80*   < >  --  100*  CO2 20*   < >  --  27  GLUCOSE 236*   < >  --  90  BUN 23*   < >  --  17  CREATININE 1.22*   < >  --  1.02*  CALCIUM 11.3*   < >  --  9.4  MG 1.5*  --  1.8  --   AST 216*  --   --   --   ALT 71*  --   --   --   ALKPHOS 190*  --   --   --   BILITOT 2.3*  --   --   --    < > = values in this interval not displayed.     Microbiology Results  No results found for this or any previous visit.  RADIOLOGY:  No results  found.   Management plans discussed with the patient, family and they are in agreement.  CODE STATUS: Full Code   TOTAL TIME TAKING CARE OF THIS PATIENT: 32 minutes.    [name] M.D on 11/28/2017 at 2:03 PM  Between 7am to 6pm - Pager - [phone]  After 6pm go to [email or web address] - Proofreader  [facility]  Office  [phone]  CC: Primary care physician; Herminio Commons, MD   Note: This dictation was prepared with Dragon dictation along with smaller phrase technology. Any transcriptional errors that result from this process are unintentional.

## 2017-11-28 NOTE — Discharge Instructions (Signed)
Heart healthy diet. Smoking cessation. Alcohol detox as outpatient. HHPT

## 2017-11-28 NOTE — Care Management (Signed)
Patient without insurance and is unemployed.  She lives with her fiancee and is usually independent in all her adls.  Followed at [facility] and obtains her medications from the clinic.  She will start drawing social security at the end of the month.  She has applied for medicaid several times but has been denied.  Admitted with SVT.  She was to discharge home today but was found to be significantly orthostatic.  Have reached out to [facility] regarding possible charity care for home health physical therapy.  Provided patient with application for [facility]

## 2017-11-28 NOTE — Progress Notes (Signed)
Initial Nutrition Assessment  DOCUMENTATION CODES:   Not applicable  INTERVENTION:   Ensure Enlive po BID, each supplement provides 350 kcal and 20 grams of protein  MVI daily  Folic acid 71m po daily  Thiamine 107mpo daily   Liberalize diet  NUTRITION DIAGNOSIS:   Inadequate oral intake related to acute illness as evidenced by meal completion < 50%.  GOAL:   Patient will meet greater than or equal to 90% of their needs  MONITOR:   PO intake, Supplement acceptance, Labs, Weight trends, I & O's  REASON FOR ASSESSMENT:   Malnutrition Screening Tool    ASSESSMENT:   [age].o. female presenting to hospital after being found slumped over toilet at home and unable to stand up; pt noted to be in SVT with HR 168 bpm in ED.  Pt admitted to hospital with acute new onset a-fib with RVR and acute numerous electrolyte abnormalities.  PMH includes skin CA, depression, ETOH abuse, and acute pancreatitis.     Met with pt in room today. Pt reports poor appetite and oral intake for 3 days pta. Pt is currently eating < 50% of meals. Per chart, pt is weight stable. RD discussed with pt the importance of adequate protein intake with meals. RD will add Ensure. Pt on CIWA protocol.   Medications reviewed and include: aspirin, folic acid, MVI, nicotine, protonix, KCl, thiamine  Labs reviewed: Cl 100(L), creat 1.02(H) Mg 1.8 wnl Wbc- 12.5(H)  Nutrition-Focused physical exam completed. Findings are no fat depletion, mild muscle depletions in temporal regions and BLE, and no edema.   Diet Order:  Diet - low sodium heart healthy Diet 2 gram sodium Room service appropriate? Yes; Fluid consistency: Thin  EDUCATION NEEDS:   Education needs have been addressed  Skin:  Reviewed RN Assessment  Last BM:  12/9  Height:   Ht Readings from Last 1 Encounters:  11/26/17 _0  (1.778 m)    Weight:   Wt Readings from Last 1 Encounters:  11/27/17 154 lb 3.2 oz (69.9 kg)    Ideal Body  Weight:  68.18 kg  BMI:  Body mass index is 22.13 kg/m.  Estimated Nutritional Needs:   Kcal:  1850-2150kcal/day   Protein:  70-84g/day   Fluid:  >1.8L/day   CaKoleen DistanceS, RD, LDN Pager #- [phone] Hours Pager: [phone]

## 2017-11-28 NOTE — Plan of Care (Signed)
  Progressing Education: Knowledge of General Education information will improve 11/28/2017 0334 - Progressing by Jeri Cos, RN Clinical Measurements: Ability to maintain clinical measurements within normal limits will improve 11/28/2017 0334 - Progressing by Jeri Cos, RN Cardiovascular complication will be avoided 11/28/2017 0334 - Progressing by Jeri Cos, RN Activity: Risk for activity intolerance will decrease 11/28/2017 0334 - Progressing by Jeri Cos, RN Nutrition: Adequate nutrition will be maintained 11/28/2017 0334 - Progressing by Jeri Cos, RN Cardiac: Ability to achieve and maintain adequate cardiopulmonary perfusion will improve 11/28/2017 0334 - Progressing by Jeri Cos, RN

## 2017-11-28 NOTE — Evaluation (Signed)
Physical Therapy Evaluation Patient Details Name: [name] MRN: [id number] DOB: 02-02-1956 Today's Date: 11/28/2017   History of Present Illness  Pt is a 61 y.o. female presenting to hospital after being found slumped over toilet at home and unable to stand up; pt noted to be in SVT with HR 168 bpm in ED.  Pt admitted to hospital with acute new onset a-fib with RVR and acute numerous electrolyte abnormalities.  PMH includes skin CA, depression, ETOH abuse, and acute pancreatitis.    Clinical Impression  Prior to hospital admission, pt was independent with functional mobility.  Pt lives with her significant other in 1 level apt (no steps to enter).  Currently pt is independent supine to sit and CGA with transfers and walking a few feet bed to recliner.  Pt overall fairly steady but limited distance ambulating d/t pt's c/o significant dizziness in standing/attempted ambulation.  Pt's BP noted to be 95/67 sitting in recliner; 83/65 in standing (pt reporting feeling very dizzy); and 107/67 reclining in chair end of session (MD Bridgett Larsson and nursing notified of pt's BP during session and symptoms).  Pt would benefit from skilled PT to address noted impairments and functional limitations (see below for any additional details).  Upon hospital discharge, recommend pt discharge to home with support of significant other when medically appropriate.    Follow Up Recommendations Home health PT    Equipment Recommendations  None recommended by PT    Recommendations for Other Services       Precautions / Restrictions Precautions Precautions: Fall Precaution Comments: Monitor BP; CIWA; seizures Restrictions Weight Bearing Restrictions: No      Mobility  Bed Mobility Overal bed mobility: Independent             General bed mobility comments: Supine to sit with HOB flat with mild increased effort  Transfers Overall transfer level: Needs assistance Equipment used: None Transfers: Sit to/from  Stand Sit to Stand: Min guard         General transfer comment: x1 trial from bed and x1 trial from recliner chair; mild increased effort to stand but steady (CGA for safety)  Ambulation/Gait Ambulation/Gait assistance: Min guard Ambulation Distance (Feet): 3 Feet(bed to recliner) Assistive device: None   Gait velocity: mildly decreased   General Gait Details: steady but limited distance d/t c/o dizziness  Stairs            Wheelchair Mobility    Modified Rankin (Stroke Patients Only)       Balance Overall balance assessment: Needs assistance Sitting-balance support: No upper extremity supported;Feet supported Sitting balance-Leahy Scale: Good Sitting balance - Comments: steady sitting reaching within BOS   Standing balance support: No upper extremity supported Standing balance-Leahy Scale: Good Standing balance comment: steady reaching within BOS                             Pertinent Vitals/Pain Pain Assessment: No/denies pain  HR in 90's bpm during session.    Home Living Family/patient expects to be discharged to:: Private residence Living Arrangements: Spouse/significant other(Significant other) Available Help at Discharge: Other (Comment)(Significant other) Type of Home: Apartment(main level) Home Access: Level entry     Home Layout: One level Home Equipment: Walker - 2 wheels;Cane - single point;Bedside commode;Shower seat;Hand held shower head;Grab bars - tub/shower;Grab bars - toilet      Prior Function Level of Independence: Independent         Comments: Pt reports  1 fall day before hospital admission (hit head).     Hand Dominance        Extremity/Trunk Assessment   Upper Extremity Assessment Upper Extremity Assessment: Generalized weakness    Lower Extremity Assessment Lower Extremity Assessment: Generalized weakness       Communication   Communication: No difficulties  Cognition Arousal/Alertness:  Awake/alert Behavior During Therapy: WFL for tasks assessed/performed Overall Cognitive Status: Within Functional Limits for tasks assessed                                        General Comments General comments (skin integrity, edema, etc.): Small bruise noted medial to pt's R eye; bruise noted on pt's forehead (pt reports from fall the day prior to hospital admission).  Nursing cleared pt for participation in physical therapy.  Pt agreeable to PT session.    Exercises General Exercises - Lower Extremity Ankle Circles/Pumps: AROM;Strengthening;Both;10 reps;Supine Quad Sets: AROM;Strengthening;Both;10 reps;Supine Short Arc Quad: AROM;Strengthening;Both;10 reps;Supine Heel Slides: AROM;Strengthening;Both;10 reps;Supine Hip ABduction/ADduction: AROM;Strengthening;Both;10 reps;Supine   Assessment/Plan    PT Assessment Patient needs continued PT services  PT Problem List Decreased strength;Decreased activity tolerance;Decreased balance;Decreased mobility       PT Treatment Interventions DME instruction;Gait training;Functional mobility training;Therapeutic activities;Therapeutic exercise;Balance training;Patient/family education    PT Goals (Current goals can be found in the Care Plan section)  Acute Rehab PT Goals Patient Stated Goal: to go home PT Goal Formulation: With patient Time For Goal Achievement: 12/12/17 Potential to Achieve Goals: Good    Frequency Min 2X/week   Barriers to discharge        Co-evaluation               AM-PAC PT "6 Clicks" Daily Activity  Outcome Measure Difficulty turning over in bed (including adjusting bedclothes, sheets and blankets)?: None Difficulty moving from lying on back to sitting on the side of the bed? : A Little Difficulty sitting down on and standing up from a chair with arms (e.g., wheelchair, bedside commode, etc,.)?: A Little Help needed moving to and from a bed to chair (including a wheelchair)?: A  Little Help needed walking in hospital room?: A Little Help needed climbing 3-5 steps with a railing? : A Lot 6 Click Score: 18    End of Session Equipment Utilized During Treatment: Gait belt Activity Tolerance: Treatment limited secondary to medical complications (Comment)(BP decreasing with standing (nursing and MD notified)) Patient left: in chair;with call bell/phone within reach;with chair alarm set Nurse Communication: Mobility status;Precautions;Other (comment)(Pt's BP during session) PT Visit Diagnosis: Other abnormalities of gait and mobility (R26.89);Muscle weakness (generalized) (M62.81);History of falling (Z91.81)    Time: 0981-1914 PT Time Calculation (min) (ACUTE ONLY): 33 min   Charges:   PT Evaluation $PT Eval Low Complexity: 1 Low PT Treatments $Therapeutic Exercise: 8-22 mins   PT G Codes:   PT G-Codes **NOT FOR INPATIENT CLASS** Functional Assessment Tool Used: AM-PAC 6 Clicks Basic Mobility Functional Limitation: Mobility: Walking and moving around Mobility: Walking and Moving Around Current Status (N8295): At least 40 percent but less than 60 percent impaired, limited or restricted Mobility: Walking and Moving Around Goal Status [phone]): 0 percent impaired, limited or restricted    Leitha Bleak, PT 11/28/17, 10:58 AM [phone]

## 2017-11-28 NOTE — Care Management (Addendum)
Patient declines home health because does not want to give financial information in order to qualify for charity care.  Provided information for [facility]

## 2017-11-29 LAB — CREATININE, SERUM: Creatinine, Ser: 0.88 mg/dL (ref 0.44–1.00)

## 2017-11-29 LAB — HIV ANTIBODY (ROUTINE TESTING W REFLEX): HIV Screen 4th Generation wRfx: NONREACTIVE

## 2017-11-29 NOTE — Progress Notes (Signed)
Patient  Is discharge home in a stable condition, summary and f/u care given , verbalized understanding .

## 2017-11-29 NOTE — Plan of Care (Signed)
  Progressing Education: Knowledge of General Education information will improve 11/29/2017 1114 - Progressing by Darrelyn Hillock, RN Health Behavior/Discharge Planning: Ability to manage health-related needs will improve 11/29/2017 1114 - Progressing by Darrelyn Hillock, RN Clinical Measurements: Ability to maintain clinical measurements within normal limits will improve 11/29/2017 1114 - Progressing by Darrelyn Hillock, RN Will remain free from infection 11/29/2017 1114 - Progressing by Darrelyn Hillock, RN Diagnostic test results will improve 11/29/2017 1114 - Progressing by Darrelyn Hillock, RN Respiratory complications will improve 11/29/2017 1114 - Progressing by Darrelyn Hillock, RN Cardiovascular complication will be avoided 11/29/2017 1114 - Progressing by Darrelyn Hillock, RN Activity: Risk for activity intolerance will decrease 11/29/2017 1114 - Progressing by Darrelyn Hillock, RN Nutrition: Adequate nutrition will be maintained 11/29/2017 1114 - Progressing by Darrelyn Hillock, RN Coping: Level of anxiety will decrease 11/29/2017 1114 - Progressing by Darrelyn Hillock, RN Elimination: Will not experience complications related to bowel motility 11/29/2017 1114 - Progressing by Darrelyn Hillock, RN Will not experience complications related to urinary retention 11/29/2017 1114 - Progressing by Darrelyn Hillock, RN Pain Managment: General experience of comfort will improve 11/29/2017 1114 - Progressing by Darrelyn Hillock, RN Safety: Ability to remain free from injury will improve 11/29/2017 1114 - Progressing by Darrelyn Hillock, RN Skin Integrity: Risk for impaired skin integrity will decrease 11/29/2017 1114 - Progressing by Darrelyn Hillock, RN Education: Knowledge of disease or condition will improve 11/29/2017 1114 - Progressing by Darrelyn Hillock, RN Activity: Ability to tolerate increased activity will improve 11/29/2017 1114 - Progressing by Darrelyn Hillock, RN Cardiac: Ability to achieve and maintain adequate cardiopulmonary perfusion will improve 11/29/2017 1114 - Progressing by Darrelyn Hillock, [place] Behavior/Discharge Planning: Ability to safely manage health-related needs after discharge will improve 11/29/2017 1114 - Progressing by Darrelyn Hillock, RN

## 2017-11-29 NOTE — Plan of Care (Signed)
  Progressing Education: Knowledge of disease or condition will improve 11/29/2017 0036 - Progressing by Jeri Cos, RN Activity: Ability to tolerate increased activity will improve 11/29/2017 0036 - Progressing by Jeri Cos, RN Cardiac: Ability to achieve and maintain adequate cardiopulmonary perfusion will improve 11/29/2017 0036 - Progressing by Jeri Cos, RN

## 2017-11-29 NOTE — Progress Notes (Signed)
During the evening pt complained of some numbness in her  Feet. Able to feel when touching them. Appropriate flexion and plantar flexion. Will continue to monitor and assess.

## 2017-11-29 NOTE — Discharge Summary (Addendum)
[facility] [place] NAME: [name]    MR#:  [id number]  DATE OF BIRTH:  [birth year]  DATE OF ADMISSION:  11/26/2017   ADMITTING PHYSICIAN: Gorden Harms, MD  DATE OF DISCHARGE: 11/29/2017  PRIMARY CARE PHYSICIAN: Soles, Howell Rucks, MD   ADMISSION DIAGNOSIS:  Alcoholic ketoacidosis [G38.7] Emesis [R11.10] Cirrhosis ([place]) [K74.60] Atrial fibrillation with rapid ventricular response ([place]) [I48.91] DISCHARGE DIAGNOSIS:  Active Problems:   A-fib ([place])  SECONDARY DIAGNOSIS:   Past Medical History:  Diagnosis Date  . Cancer ([place])    skin  . Depression   . ETOH abuse    HOSPITAL COURSE:  1acute new onset A. fib with RVR  Converted to sinus rhythm after intravenous fluid resuscitation and potassium repletion, chads vas score of 1. Started low-dose aspirin, Lopressor 25 mg p.o. twice daily, discontinued Lovenox and defer long-term chronic anticoagulation per Dr. Saralyn Pilar. Lopressor is decreased to 12.5 mg p.o. twice a day due to low BP and orthostatic hypotension. The patient feels better today.  2chronic alcohol abuse/dependency On alcohol withdrawal protocol  3acute numerous electrolyte abnormalities-hyponatremia/hypokalemia/hypochloremia/hypo-bicarbonate/hypomagnesemia Secondary to alcohol abuse and nausea/emesis Improving with supplement and normal saline IV.  4chronic depression Stable Continue home regiment  5acute constipation Colace twice daily,dulcolax prn.  Tobacco abuse.  Smoking cessation was counseled for 4 minutes.   Generalized weakness.  PT evaluation:[facility]PT.  DISCHARGE CONDITIONS:  Stable.  The patient will be discharged to home with home health and PT. CONSULTS OBTAINED:  Treatment Team:  Isaias Cowman, MD DRUG ALLERGIES:  No Known Allergies DISCHARGE MEDICATIONS:   Allergies as of 11/29/2017   No Known Allergies     Medication List    TAKE these medications   aspirin  81 MG EC tablet Take 1 tablet (81 mg total) by mouth daily.   metoprolol tartrate 25 MG tablet Commonly known as:  LOPRESSOR Take 0.5 tablets (12.5 mg total) by mouth 2 (two) times daily.   NASONEX 50 MCG/ACT nasal spray Generic drug:  mometasone Place 2 sprays into both nostrils daily.   ranitidine 150 MG tablet Commonly known as:  ZANTAC Take 150 mg by mouth 2 (two) times daily.   traZODone 100 MG tablet Commonly known as:  DESYREL Take 100 mg by mouth at bedtime as needed. for sleep   venlafaxine XR 75 MG 24 hr capsule Commonly known as:  EFFEXOR-XR Take 225 mg by mouth daily.        DISCHARGE INSTRUCTIONS:  See AVS. If you experience worsening of your admission symptoms, develop shortness of breath, life threatening emergency, suicidal or homicidal thoughts you must seek medical attention immediately by calling 911 or calling your MD immediately  if symptoms less severe.  You Must read complete instructions/literature along with all the possible adverse reactions/side effects for all the Medicines you take and that have been prescribed to you. Take any new Medicines after you have completely understood and accpet all the possible adverse reactions/side effects.   Please note  You were cared for by a hospitalist during your hospital stay. If you have any questions about your discharge medications or the care you received while you were in the hospital after you are discharged, you can call the unit and asked to speak with the hospitalist on call if the hospitalist that took care of you is not available. Once you are discharged, your primary care physician will handle any further medical issues. Please note that NO REFILLS for any discharge  medications will be authorized once you are discharged, as it is imperative that you return to your primary care physician (or establish a relationship with a primary care physician if you do not have one) for your aftercare needs so that they  can reassess your need for medications and monitor your lab values.    On the day of Discharge:  VITAL SIGNS:  Blood pressure (!) 102/59, pulse 90, temperature 97.7 F (36.5 C), temperature source Oral, resp. rate 14, height 5\' 10"  (1.778 m), weight 154 lb 3.2 oz (69.9 kg), SpO2 99 %. PHYSICAL EXAMINATION:  GENERAL:  61 y.o.-year-old patient lying in the bed with no acute distress.  EYES: Pupils equal, round, reactive to light and accommodation. No scleral icterus. Extraocular muscles intact.  HEENT: Head atraumatic, normocephalic. Oropharynx and nasopharynx clear.  NECK:  Supple, no jugular venous distention. No thyroid enlargement, no tenderness.  LUNGS: Normal breath sounds bilaterally, no wheezing, rales,rhonchi or crepitation. No use of accessory muscles of respiration.  CARDIOVASCULAR: S1, S2 normal. No murmurs, rubs, or gallops.  ABDOMEN: Soft, non-tender, non-distended. Bowel sounds present. No organomegaly or mass.  EXTREMITIES: No pedal edema, cyanosis, or clubbing.  NEUROLOGIC: Cranial nerves II through XII are intact. Muscle strength 5/5 in all extremities. Sensation intact. Gait not checked.  PSYCHIATRIC: The patient is alert and oriented x 3.  SKIN: No obvious rash, lesion, or ulcer.  DATA REVIEW:   CBC Recent Labs  Lab 11/27/17 0140  WBC 12.5*  HGB 13.3  HCT 38.6  PLT 261    Chemistries  Recent Labs  Lab 11/26/17 1918  11/27/17 0659 11/28/17 0605 11/29/17 0512  NA 130*   < >  --  137  --   K 2.3*   < >  --  3.5  --   CL 80*   < >  --  100*  --   CO2 20*   < >  --  27  --   GLUCOSE 236*   < >  --  90  --   BUN 23*   < >  --  17  --   CREATININE 1.22*   < >  --  1.02* 0.88  CALCIUM 11.3*   < >  --  9.4  --   MG 1.5*  --  1.8  --   --   AST 216*  --   --   --   --   ALT 71*  --   --   --   --   ALKPHOS 190*  --   --   --   --   BILITOT 2.3*  --   --   --   --    < > = values in this interval not displayed.     Microbiology Results  No results found  for this or any previous visit.  RADIOLOGY:  No results found.   Management plans discussed with the patient, family and they are in agreement.  CODE STATUS: Full Code   TOTAL TIME TAKING CARE OF THIS PATIENT: 23 minutes.    [name] M.D on 11/29/2017 at 1:14 PM  Between 7am to 6pm - Pager - [phone]  After 6pm go to [email or web address] - Proofreader  [facility]  Office  [phone]  CC: Primary care physician; Herminio Commons, MD   Note: This dictation was prepared with Dragon dictation along with smaller phrase technology. Any transcriptional errors that result from this process are unintentional.

## 2018-05-19 ENCOUNTER — Other Ambulatory Visit: Payer: Self-pay

## 2018-05-19 ENCOUNTER — Emergency Department: Payer: Self-pay

## 2018-05-19 ENCOUNTER — Inpatient Hospital Stay
Admission: EM | Admit: 2018-05-19 | Discharge: 2018-05-21 | DRG: 309 | Disposition: A | Discharge status: Home Health | Payer: Self-pay | Attending: Internal Medicine | Admitting: Internal Medicine

## 2018-05-19 DIAGNOSIS — R739 Hyperglycemia, unspecified: Secondary | ICD-10-CM | POA: Diagnosis present

## 2018-05-19 DIAGNOSIS — Z85828 Personal history of other malignant neoplasm of skin: Secondary | ICD-10-CM

## 2018-05-19 DIAGNOSIS — Z91128 Patient's intentional underdosing of medication regimen for other reason: Secondary | ICD-10-CM

## 2018-05-19 DIAGNOSIS — Z7982 Long term (current) use of aspirin: Secondary | ICD-10-CM

## 2018-05-19 DIAGNOSIS — I480 Paroxysmal atrial fibrillation: Principal | ICD-10-CM | POA: Diagnosis present

## 2018-05-19 DIAGNOSIS — Y92009 Unspecified place in unspecified non-institutional (private) residence as the place of occurrence of the external cause: Secondary | ICD-10-CM

## 2018-05-19 DIAGNOSIS — E872 Acidosis: Secondary | ICD-10-CM | POA: Diagnosis present

## 2018-05-19 DIAGNOSIS — F1020 Alcohol dependence, uncomplicated: Secondary | ICD-10-CM | POA: Diagnosis present

## 2018-05-19 DIAGNOSIS — I4891 Unspecified atrial fibrillation: Secondary | ICD-10-CM

## 2018-05-19 DIAGNOSIS — K7030 Alcoholic cirrhosis of liver without ascites: Secondary | ICD-10-CM | POA: Diagnosis present

## 2018-05-19 DIAGNOSIS — F17210 Nicotine dependence, cigarettes, uncomplicated: Secondary | ICD-10-CM | POA: Diagnosis present

## 2018-05-19 DIAGNOSIS — Y900 Blood alcohol level of less than 20 mg/100 ml: Secondary | ICD-10-CM

## 2018-05-19 DIAGNOSIS — T447X6A Underdosing of beta-adrenoreceptor antagonists, initial encounter: Secondary | ICD-10-CM | POA: Diagnosis present

## 2018-05-19 DIAGNOSIS — F329 Major depressive disorder, single episode, unspecified: Secondary | ICD-10-CM | POA: Diagnosis present

## 2018-05-19 DIAGNOSIS — E876 Hypokalemia: Secondary | ICD-10-CM | POA: Diagnosis present

## 2018-05-19 LAB — BASIC METABOLIC PANEL
Anion gap: 23 — ABNORMAL HIGH (ref 5–15)
BUN: 9 mg/dL (ref 6–20)
CHLORIDE: 94 mmol/L — AB (ref 101–111)
CO2: 23 mmol/L (ref 22–32)
CREATININE: 1.01 mg/dL — AB (ref 0.44–1.00)
Calcium: 8.7 mg/dL — ABNORMAL LOW (ref 8.9–10.3)
GFR, EST NON AFRICAN AMERICAN: 58 mL/min — AB (ref >60)
Glucose, Bld: 179 mg/dL — ABNORMAL HIGH (ref 65–99)
POTASSIUM: 2.7 mmol/L — AB (ref 3.5–5.1)
SODIUM: 140 mmol/L (ref 135–145)

## 2018-05-19 LAB — CBC
HCT: 45.8 % (ref 35.0–47.0)
Hemoglobin: 15.7 g/dL (ref 12.0–16.0)
MCH: 34.7 pg — ABNORMAL HIGH (ref 26.0–34.0)
MCHC: 34.3 g/dL (ref 32.0–36.0)
MCV: 101.1 fL — AB (ref 80.0–100.0)
PLATELETS: 250 10*3/uL (ref 150–440)
RBC: 4.53 MIL/uL (ref 3.80–5.20)
RDW: 13.8 % (ref 11.5–14.5)
WBC: 7.6 10*3/uL (ref 3.6–11.0)

## 2018-05-19 LAB — ETHANOL

## 2018-05-19 LAB — GLUCOSE, CAPILLARY: Glucose-Capillary: 85 mg/dL (ref 65–99)

## 2018-05-19 LAB — TROPONIN I: Troponin I: <0.03 ng/mL (ref <0.03)

## 2018-05-19 LAB — PHOSPHORUS: PHOSPHORUS: 3 mg/dL (ref 2.5–4.6)

## 2018-05-19 LAB — MAGNESIUM: MAGNESIUM: 1.1 mg/dL — AB (ref 1.7–2.4)

## 2018-05-19 MED ORDER — ENOXAPARIN SODIUM 40 MG/0.4ML ~~LOC~~ SOLN
40.0000 mg | SUBCUTANEOUS | Status: DC
  Administered 2018-05-19 – 2018-05-20 (×2): 40 mg via SUBCUTANEOUS
  Filled 2018-05-19 (×2): qty 0.4

## 2018-05-19 MED ORDER — MAGNESIUM SULFATE 4 GM/100ML IV SOLN
4.0000 g | Freq: Once | INTRAVENOUS | Status: AC
  Administered 2018-05-19: 4 g via INTRAVENOUS
  Filled 2018-05-19: qty 100

## 2018-05-19 MED ORDER — DILTIAZEM HCL 30 MG PO TABS
30.0000 mg | ORAL_TABLET | Freq: Three times a day (TID) | ORAL | Status: DC
  Administered 2018-05-19 – 2018-05-20 (×2): 30 mg via ORAL
  Filled 2018-05-19 (×2): qty 1

## 2018-05-19 MED ORDER — POLYETHYLENE GLYCOL 3350 17 G PO PACK: 17.0000 g | PACK | Freq: Every day | ORAL | Status: DC | PRN

## 2018-05-19 MED ORDER — TRAZODONE HCL 100 MG PO TABS: 100.0000 mg | ORAL_TABLET | Freq: Every evening | ORAL | Status: DC | PRN

## 2018-05-19 MED ORDER — VENLAFAXINE HCL ER 75 MG PO CP24
225.0000 mg | ORAL_CAPSULE | Freq: Every day | ORAL | Status: DC
  Filled 2018-05-19: qty 3

## 2018-05-19 MED ORDER — POTASSIUM CHLORIDE CRYS ER 20 MEQ PO TBCR
EXTENDED_RELEASE_TABLET | ORAL | Status: AC
  Administered 2018-05-19: 20 meq via ORAL
  Filled 2018-05-19: qty 1

## 2018-05-19 MED ORDER — FOLIC ACID 1 MG PO TABS
1.0000 mg | ORAL_TABLET | Freq: Every day | ORAL | Status: DC
  Administered 2018-05-19 – 2018-05-21 (×3): 1 mg via ORAL
  Filled 2018-05-19 (×3): qty 1

## 2018-05-19 MED ORDER — SENNA 8.6 MG PO TABS
1.0000 | ORAL_TABLET | Freq: Two times a day (BID) | ORAL | Status: DC
  Filled 2018-05-19 (×5): qty 1

## 2018-05-19 MED ORDER — VITAMIN B-1 100 MG PO TABS
100.0000 mg | ORAL_TABLET | Freq: Every day | ORAL | Status: DC
  Administered 2018-05-19 – 2018-05-21 (×3): 100 mg via ORAL
  Filled 2018-05-19 (×3): qty 1

## 2018-05-19 MED ORDER — ONDANSETRON HCL 4 MG PO TABS
4.0000 mg | ORAL_TABLET | Freq: Four times a day (QID) | ORAL | Status: DC | PRN
  Administered 2018-05-19: 4 mg via ORAL
  Filled 2018-05-19: qty 1

## 2018-05-19 MED ORDER — METOPROLOL TARTRATE 5 MG/5ML IV SOLN
5.0000 mg | Freq: Once | INTRAVENOUS | Status: AC
  Administered 2018-05-19: 5 mg via INTRAVENOUS

## 2018-05-19 MED ORDER — SODIUM CHLORIDE 0.9 % IV BOLUS
1000.0000 mL | Freq: Once | INTRAVENOUS | Status: AC
  Administered 2018-05-19: 1000 mL via INTRAVENOUS

## 2018-05-19 MED ORDER — ADULT MULTIVITAMIN W/MINERALS CH
1.0000 | ORAL_TABLET | Freq: Every day | ORAL | Status: DC
  Administered 2018-05-19 – 2018-05-21 (×3): 1 via ORAL
  Filled 2018-05-19 (×3): qty 1

## 2018-05-19 MED ORDER — INSULIN ASPART 100 UNIT/ML ~~LOC~~ SOLN: 0.0000 [IU] | Freq: Three times a day (TID) | SUBCUTANEOUS | Status: DC

## 2018-05-19 MED ORDER — POTASSIUM CHLORIDE CRYS ER 20 MEQ PO TBCR
20.0000 meq | EXTENDED_RELEASE_TABLET | Freq: Two times a day (BID) | ORAL | Status: DC
  Administered 2018-05-19 – 2018-05-21 (×5): 20 meq via ORAL
  Filled 2018-05-19 (×5): qty 1

## 2018-05-19 MED ORDER — VENLAFAXINE HCL ER 75 MG PO CP24
225.0000 mg | ORAL_CAPSULE | Freq: Every day | ORAL | Status: DC
  Administered 2018-05-19 – 2018-05-20 (×2): 225 mg via ORAL
  Filled 2018-05-19 (×2): qty 3

## 2018-05-19 MED ORDER — POTASSIUM CHLORIDE 10 MEQ/100ML IV SOLN
10.0000 meq | Freq: Once | INTRAVENOUS | Status: AC
  Administered 2018-05-19: 10 meq via INTRAVENOUS
  Filled 2018-05-19: qty 100

## 2018-05-19 MED ORDER — ASPIRIN 81 MG PO CHEW
81.0000 mg | CHEWABLE_TABLET | Freq: Every day | ORAL | Status: DC
  Administered 2018-05-19 – 2018-05-21 (×3): 81 mg via ORAL
  Filled 2018-05-19 (×3): qty 1

## 2018-05-19 MED ORDER — POTASSIUM CHLORIDE 10 MEQ/100ML IV SOLN
10.0000 meq | INTRAVENOUS | Status: AC
Start: 2018-05-19 — End: 2018-05-19
  Administered 2018-05-19: 10 meq via INTRAVENOUS
  Filled 2018-05-19 (×4): qty 100

## 2018-05-19 MED ORDER — ONDANSETRON HCL 4 MG/2ML IJ SOLN: 4.0000 mg | Freq: Four times a day (QID) | INTRAMUSCULAR | Status: DC | PRN

## 2018-05-19 MED ORDER — FAMOTIDINE 20 MG PO TABS
20.0000 mg | ORAL_TABLET | Freq: Two times a day (BID) | ORAL | Status: DC
  Administered 2018-05-19 – 2018-05-21 (×3): 20 mg via ORAL
  Filled 2018-05-19 (×4): qty 1

## 2018-05-19 NOTE — Progress Notes (Addendum)
[facility] for Electrolytes Indication: hypokalemia  No Known Allergies  Labs: Recent Labs    05/19/18 1401 05/19/18 1833  WBC 7.6  --   HGB 15.7  --   HCT 45.8  --   PLT 250  --   CREATININE 1.01*  --   PHOS  --  3.0   Estimated Creatinine Clearance: 62 mL/min (A) (by C-G formula based on SCr of 1.01 mg/dL (H)).  Sodium (mmol/L)  Date Value  05/19/2018 140   Potassium (mmol/L)  Date Value  05/19/2018 2.7 (LL)   Magnesium (mg/dL)  Date Value  11/27/2017 1.8   Calcium (mg/dL)  Date Value  05/19/2018 8.7 (L)   Albumin (g/dL)  Date Value  11/26/2017 3.7    Assessment: 62 yo female admitted with generalized weakness. She has a known history of alcohol abuse, a fib, and cirrhosis of liver. Pharmacy has been consulted for electrolyte replacement and management.    Goal of Therapy:  K = 3.5 - 5.1 Mg = 1.7 - 2.4  Plan:  K = 2.7 Mg = 1.1  Patient has received K 67mEq IV x once and has KCl 52mEq PO BID ordered. Will give additional KCl 42mEq x 4. Will give Mg 4g IV once. Will recheck K 6 hours after last dose of IV K. Will recheck labs in AM.   Lendon Ka, PharmD Pharmacy Resident 05/19/2018,7:15 PM

## 2018-05-19 NOTE — ED Triage Notes (Signed)
To ER via [facility] from home c/o generalized weakness. EMS report pt pale and diaphoretic upon arrival to house. Pt denies pain. Reports weakness since yesterday. Feels better when lying flat. Pt pale now. CBG 233. Tachycardic. 20G to right hand started by EMS. Alert and oriented X 4.

## 2018-05-19 NOTE — ED Provider Notes (Signed)
[facility] Emergency Department Provider Note ____________________________________________   First MD Initiated Contact with Patient 05/19/18 1420     (approximate)  I have reviewed the triage vital signs and the nursing notes.   HISTORY  Chief Complaint Weakness    HPI [name] is a 62 y.o. female with PMH as noted below who presents with lightheadedness and generalized weakness, acute onset when she woke this morning, constant since then, worse when she tries to sit up or stand up, and not associated with any shortness of breath or chest pain.  Patient states that she was diagnosed with atrial fibrillation after having similar symptoms, and was prescribed metoprolol but states that it makes her have diarrhea and feel weak, so she has been noncompliant with it.  Past Medical History:  Diagnosis Date  . Cancer ([place])    skin  . Depression   . ETOH abuse     Patient Active Problem List   Diagnosis Date Noted  . A-fib ([place]) 11/26/2017  . Malnutrition of moderate degree 09/27/2016  . Acute pancreatitis 09/26/2016    Past Surgical History:  Procedure Laterality Date  . MOLE REMOVAL      Prior to Admission medications   Medication Sig Start Date End Date Taking? Authorizing Provider  aspirin EC 81 MG EC tablet Take 1 tablet (81 mg total) by mouth daily. 11/29/17  Yes [name], MD  metoprolol tartrate (LOPRESSOR) 25 MG tablet Take 0.5 tablets (12.5 mg total) by mouth 2 (two) times daily. 11/28/17  Yes [name], MD  NASONEX 50 MCG/ACT nasal spray Place 2 sprays into both nostrils daily. 10/18/17  Yes [provider]  traZODone (DESYREL) 100 MG tablet Take 100 mg by mouth at bedtime as needed. for sleep 10/07/17  Yes [provider]  venlafaxine XR (EFFEXOR-XR) 75 MG 24 hr capsule Take 225 mg by mouth daily.    Yes [provider]    Allergies Patient has no known allergies.  Family History  Problem Relation  Age of Onset  . Breast cancer Neg Hx     Social History Social History   Tobacco Use  . Smoking status: Current Every Day Smoker    Packs/day: 0.25    Types: Cigarettes  . Smokeless tobacco: Never Used  Substance Use Topics  . Alcohol use: Yes    Alcohol/week: 0.6 oz    Types: 1 Shots of liquor per week  . Drug use: No    Review of Systems  Constitutional: No fever.  Positive for generalized weakness. Eyes: No redness. ENT: No sore throat. Cardiovascular: Denies chest pain. Respiratory: Denies shortness of breath. Gastrointestinal: No vomiting. Genitourinary: Negative for dysuria.  Musculoskeletal: Negative for back pain. Skin: Negative for rash. Neurological: Negative for headache.   ____________________________________________   PHYSICAL EXAM:  VITAL SIGNS: ED Triage Vitals  Enc Vitals Group     BP 05/19/18 1359 113/84     Pulse Rate 05/19/18 1359 (!) 107     Resp 05/19/18 1359 18     Temp 05/19/18 1359 97.7 F (36.5 C)     Temp Source 05/19/18 1359 Oral     SpO2 05/19/18 1359 98 %     Weight 05/19/18 1400 150 lb (68 kg)     Height 05/19/18 1400 5\' 10"  (1.778 m)     Head Circumference --      Peak Flow --      Pain Score 05/19/18 1400 0     Pain Loc --  Pain Edu? --      Excl. in [place]? --     Constitutional: Alert and oriented.  Somewhat weak appearing but and in no acute distress. Eyes: Conjunctivae are normal.  EOMI. Head: Atraumatic. Nose: No congestion/rhinnorhea. Mouth/Throat: Mucous membranes are dry.   Neck: Normal range of motion.  Cardiovascular: Tachycardic, irregular rhythm. Grossly normal heart sounds.  Good peripheral circulation. Respiratory: Normal respiratory effort.  No retractions. Lungs CTAB. Gastrointestinal: Soft and nontender. No distention.  Genitourinary: No flank tenderness. Musculoskeletal: No lower extremity edema.  Extremities warm and well perfused.  Neurologic:  Normal speech and language. No gross focal neurologic  deficits are appreciated.  Skin:  Skin is warm and dry. No rash noted. Psychiatric: Mood and affect are normal. Speech and behavior are normal.  ____________________________________________   LABS (all labs ordered are listed, but only abnormal results are displayed)  Labs Reviewed  BASIC METABOLIC PANEL - Abnormal; Notable for the following components:      Result Value   Potassium 2.7 (*)    Chloride 94 (*)    Glucose, Bld 179 (*)    Creatinine, Ser 1.01 (*)    Calcium 8.7 (*)    GFR calc non Af Amer 58 (*)    Anion gap 23 (*)    All other components within normal limits  CBC - Abnormal; Notable for the following components:   MCV 101.1 (*)    MCH 34.7 (*)    All other components within normal limits  MAGNESIUM - Abnormal; Notable for the following components:   Magnesium 1.1 (*)    All other components within normal limits  TROPONIN I  ETHANOL  PHOSPHORUS  GLUCOSE, CAPILLARY  URINALYSIS, COMPLETE (UACMP) WITH MICROSCOPIC  MAGNESIUM  URINE DRUG SCREEN, QUALITATIVE ([facility] ONLY)  HEMOGLOBIN A1C   ____________________________________________  EKG  ED ECG REPORT I, Arta Silence, the attending physician, personally viewed and interpreted this ECG.  Date: 05/19/2018 EKG Time: 1402 Rate: 106 sinus tachycardia Rhythm: Sinus tachycardia QRS Axis: Borderline right axis Intervals: normal ST/T Wave abnormalities: Lateral T wave inversions Narrative Interpretation: no evidence of acute ischemia; no significant change when compared to EKG of 11/30/2017  ____________________________________________  RADIOLOGY  CXR: No focal infiltrate  ____________________________________________   PROCEDURES  Procedure(s) performed: No  Procedures  Critical Care performed: No ____________________________________________   INITIAL IMPRESSION / ASSESSMENT AND PLAN / ED COURSE  Pertinent labs & imaging results that were available during my care of the patient were  reviewed by me and considered in my medical decision making (see chart for details).  62 year old female with PMH as noted above presents with generalized weakness and lightheadedness.  The patient states that she was diagnosed with atrial fibrillation a few months ago, but has been noncompliant with her medication because she does not like the side effects.  I reviewed the past medical records in Epic; the patient was most recently admitted last December for alcoholic ketoacidosis as well as new onset atrial fibrillation.  On exam here, the patient's vital signs were normal except for tachycardia (elevated RR noted in epic appears to be a spurious reading as the patient had a normal respiratory rate during my exam).  She apparently initially was in sinus, however during my exam she was in atrial fibrillation with RVR.  The remainder of the exam is as described above.  Overall I suspect that the patient's symptoms are most likely related to atrial fibrillation with RVR.  Differential also includes dehydration, other metabolic abnormality, or  infection.  Plan: IV fluids, lab work-up, rate control, and reassess.  ----------------------------------------- 9:02 PM on 05/19/2018 -----------------------------------------  Patient's rate was controlled after IV metoprolol, however she was also noted to be significantly hyponatremic.  Based on the combination of these findings as well as the need for reestablishment of rate control, the patient will benefit from admission.  I signed the patient out to the hospitalist Dr. Posey Pronto. ____________________________________________   FINAL CLINICAL IMPRESSION(S) / ED DIAGNOSES  Final diagnoses:  Atrial fibrillation with RVR ([place])  Hypokalemia      NEW MEDICATIONS STARTED DURING THIS VISIT:  Current Discharge Medication List       Note:  This document was prepared using Dragon voice recognition software and may include unintentional dictation  errors.    Arta Silence, MD 05/19/18 2102

## 2018-05-19 NOTE — ED Notes (Signed)
Pt aware of need for urine sample.  

## 2018-05-19 NOTE — ED Notes (Signed)
Report to [place], RN

## 2018-05-19 NOTE — H&P (Signed)
[place] [facility] [place] NAME: [name]    MR#:  [id number]  DATE OF BIRTH:  1956/10/21  DATE OF ADMISSION:  05/19/2018  PRIMARY CARE PHYSICIAN: Soles, Howell Rucks, MD   REQUESTING/REFERRING PHYSICIAN: Dr Cherylann Banas  CHIEF COMPLAINT:   Feeling weak dizzy and lightheaded December of last year HISTORY OF PRESENT ILLNESS:  [name]  is a 61 y.o. female with a known history of alcohol abuse, atrial fibrillation, cirrhosis of liver to the emergency room with complaints of weakness and dizziness that has been going since December of last year. Patient seems to be a poor historian not taking care of herself has history of chronic alcoholism and drinks vodka on a regular basis. Patient does not quantify how much - she drinks. She tells me she has gotten a lot better than before. She lives with a roommate who is disabled and also has history of alcoholism. In the ER patient was found to be in a fib with RVR heart rate was in the 150 she received IV metoprolol during my evaluation blood pressure was 108/93 heart rate was in the 100s. She is being admitted for further evaluation of management. She was found to have low potassium.  I have ordered magnesium, phosphorus, serum alcohol and urine drug screen which is still pending.  PAST MEDICAL HISTORY:   Past Medical History:  Diagnosis Date  . Cancer ([place])    skin  . Depression   . ETOH abuse     PAST SURGICAL HISTOIRY:   Past Surgical History:  Procedure Laterality Date  . MOLE REMOVAL      SOCIAL HISTORY:   Social History   Tobacco Use  . Smoking status: Current Every Day Smoker    Packs/day: 0.25    Types: Cigarettes  . Smokeless tobacco: Never Used  Substance Use Topics  . Alcohol use: Yes    Alcohol/week: 0.6 oz    Types: 1 Shots of liquor per week    FAMILY HISTORY:   Family History  Problem Relation Age of Onset  . Breast cancer Neg Hx     DRUG ALLERGIES:   No Known Allergies  REVIEW OF SYSTEMS:  Review of Systems  Constitutional: Positive for malaise/fatigue. Negative for chills, fever and weight loss.  HENT: Negative for ear discharge, ear pain and nosebleeds.   Eyes: Negative for blurred vision, pain and discharge.  Respiratory: Negative for sputum production, shortness of breath, wheezing and stridor.   Cardiovascular: Negative for chest pain, palpitations, orthopnea and PND.  Gastrointestinal: Negative for abdominal pain, diarrhea, nausea and vomiting.  Genitourinary: Negative for frequency and urgency.  Musculoskeletal: Negative for back pain and joint pain.  Neurological: Positive for weakness. Negative for sensory change, speech change and focal weakness.  Psychiatric/Behavioral: Negative for depression and hallucinations. The patient is not nervous/anxious.      MEDICATIONS AT HOME:   Prior to Admission medications   Medication Sig Start Date End Date Taking? Authorizing Provider  aspirin EC 81 MG EC tablet Take 1 tablet (81 mg total) by mouth daily. 11/29/17   [name], MD  metoprolol tartrate (LOPRESSOR) 25 MG tablet Take 0.5 tablets (12.5 mg total) by mouth 2 (two) times daily. 11/28/17   [name], MD  NASONEX 50 MCG/ACT nasal spray Place 2 sprays into both nostrils daily. 10/18/17   [provider]  ranitidine (ZANTAC) 150 MG tablet Take 150 mg by mouth 2 (two) times daily.    [provider]  traZODone (DESYREL) 100 MG tablet Take 100 mg by mouth at bedtime as needed. for sleep 10/07/17   [provider]  venlafaxine XR (EFFEXOR-XR) 75 MG 24 hr capsule Take 225 mg by mouth daily.     [provider]      VITAL SIGNS:  Blood pressure 108/84, pulse 98, temperature 97.7 F (36.5 C), temperature source Oral, resp. rate 16, height 5\' 10"  (1.778 m), weight 68 kg (150 lb), SpO2 96 %.  PHYSICAL EXAMINATION:  GENERAL:  62 y.o.-year-old patient lying in the bed with no acute distress.  Disheveled, poor hygieneEYES: Pupils equal, round, reactive to light and accommodation. No scleral icterus. Extraocular muscles intact.  HEENT: Head atraumatic, normocephalic. Oropharynx and nasopharynx clear.  NECK:  Supple, no jugular venous distention. No thyroid enlargement, no tenderness.  LUNGS: Normal breath sounds bilaterally, no wheezing, rales,rhonchi or crepitation. No use of accessory muscles of respiration.  CARDIOVASCULAR: S1, S2 normal. No murmurs, rubs, or gallops. Tachycardia ABDOMEN: Soft, nontender, nondistended. Bowel sounds present. No organomegaly or mass.  EXTREMITIES: No pedal edema, cyanosis, or clubbing.  NEUROLOGIC: Cranial nerves II through XII are intact. Muscle strength 5/5 in all extremities. Sensation intact. Gait not checked.  PSYCHIATRIC: The patient is alert and oriented x 3.   SKIN: No obvious rash, lesion, or ulcer.   LABORATORY PANEL:   CBC Recent Labs  Lab 05/19/18 1401  WBC 7.6  HGB 15.7  HCT 45.8  PLT 250   ------------------------------------------------------------------------------------------------------------------  Chemistries  Recent Labs  Lab 05/19/18 1401  NA 140  K 2.7*  CL 94*  CO2 23  GLUCOSE 179*  BUN 9  CREATININE 1.01*  CALCIUM 8.7*   ------------------------------------------------------------------------------------------------------------------  Cardiac Enzymes Recent Labs  Lab 05/19/18 1548  TROPONINI <0.03   ------------------------------------------------------------------------------------------------------------------  RADIOLOGY:  Dg Chest Portable 1 View  Result Date: 05/19/2018 CLINICAL DATA:  Generalized weakness EXAM: PORTABLE CHEST 1 VIEW COMPARISON:  None. FINDINGS: The heart size and mediastinal contours are within normal limits. Both lungs are clear. The visualized skeletal structures are unremarkable. Linear density right medial lung base probably represents scarring. IMPRESSION: No active  disease. Electronically Signed   By: Franchot Gallo M.D.   On: 05/19/2018 16:17    EKG:  a fib with RVR----currently sinus tach IMPRESSION AND PLAN:   [name]  is a 62 y.o. female with a known history of alcohol abuse, atrial fibrillation, cirrhosis of liver to the emergency room with complaints of weakness and dizziness that has been going since December of last year. Patient seems to be a poor historian not taking care of herself has history of chronic alcoholism and drinks vodka on a regular basis.  1. a fib with RVR/tachyarrhythmia -admit to telemetry floor -start patient on oral diltiazem 30 mg TID. -Replace electrolytes -continue aspirin -consider cardiology consultation if needed  2. chronic alcoholism with alcoholic ketoacidosis -patient drinks vodka on a regular basis. She does not quantify -anion gap 23 -continue IV fluids -encouraged patient to eat regular diet -multivitamin, folic acid, thiamine  3. Tobacco abuse -advised smoking cessation  4. Cirrhosis of liver--- alcohol induced -advised abstinence  5. DVT prophylaxis subcu Lovenox  6. Hyperglycemia with no history of diabetes -monitor sugars -check A1c -sliding scale insulin  All the records are reviewed and case discussed with ED provider. Management plans discussed with the patient, family and they are in agreement.  CODE STATUS: full  TOTAL TIME TAKING CARE OF THIS PATIENT: 45* minutes.    Darya Bigler  Valerio Pinard M.D on 05/19/2018 at 6:25 PM  Between 7am to 6pm - Pager - [phone]  After 6pm go to [email or web address] - password [email or web address] [facility]  [facility]  Office  [phone]  CC: Primary care physician; Herminio Commons, MD

## 2018-05-19 NOTE — Progress Notes (Signed)
Patient is alert and oriented  X 4. Denied any acute pain at this moment. Full assessment  to epic completed.  Patient did not voice any complaint. Will continue to monitor.

## 2018-05-19 NOTE — ED Notes (Signed)
Admitting MD at bedside.

## 2018-05-19 NOTE — ED Notes (Signed)
No metoprolol in pyxis, pharmacy called and asked to send.

## 2018-05-19 NOTE — ED Notes (Signed)
Date and time results received: 05/19/18 4:00 PM  (use smartphrase ".now" to insert current time)  Test: potassium Critical Value: 2.7 Name of Provider Notified: Dr. Arta Silence

## 2018-05-20 LAB — HEMOGLOBIN A1C
Hgb A1c MFr Bld: 4.5 % — ABNORMAL LOW (ref 4.8–5.6)
MEAN PLASMA GLUCOSE: 82.45 mg/dL

## 2018-05-20 LAB — BASIC METABOLIC PANEL
ANION GAP: 10 (ref 5–15)
BUN: 11 mg/dL (ref 6–20)
CHLORIDE: 103 mmol/L (ref 101–111)
CO2: 25 mmol/L (ref 22–32)
Calcium: 7.9 mg/dL — ABNORMAL LOW (ref 8.9–10.3)
Creatinine, Ser: 0.71 mg/dL (ref 0.44–1.00)
GFR calc Af Amer: >60 mL/min (ref >60)
GFR calc non Af Amer: >60 mL/min (ref >60)
GLUCOSE: 84 mg/dL (ref 65–99)
POTASSIUM: 3.5 mmol/L (ref 3.5–5.1)
Sodium: 138 mmol/L (ref 135–145)

## 2018-05-20 LAB — GLUCOSE, CAPILLARY
GLUCOSE-CAPILLARY: 108 mg/dL — AB (ref 65–99)
Glucose-Capillary: 83 mg/dL (ref 65–99)
Glucose-Capillary: 75 mg/dL (ref 65–99)

## 2018-05-20 LAB — MAGNESIUM: Magnesium: 2.6 mg/dL — ABNORMAL HIGH (ref 1.7–2.4)

## 2018-05-20 MED ORDER — ENSURE ENLIVE PO LIQD
237.0000 mL | Freq: Two times a day (BID) | ORAL | Status: DC
  Administered 2018-05-20 – 2018-05-21 (×3): 237 mL via ORAL

## 2018-05-20 MED ORDER — POTASSIUM CHLORIDE 10 MEQ/100ML IV SOLN
10.0000 meq | Freq: Once | INTRAVENOUS | Status: AC
  Administered 2018-05-20: 10 meq via INTRAVENOUS
  Filled 2018-05-20: qty 100

## 2018-05-20 MED ORDER — VENLAFAXINE HCL ER 75 MG PO CP24: 225.0000 mg | ORAL_CAPSULE | Freq: Every day | ORAL | Status: DC

## 2018-05-20 MED ORDER — DILTIAZEM HCL ER COATED BEADS 120 MG PO CP24
120.0000 mg | ORAL_CAPSULE | Freq: Every day | ORAL | Status: DC
  Administered 2018-05-20 – 2018-05-21 (×2): 120 mg via ORAL
  Filled 2018-05-20 (×2): qty 1

## 2018-05-20 MED ORDER — POTASSIUM CHLORIDE 10 MEQ/100ML IV SOLN
10.0000 meq | INTRAVENOUS | Status: AC
  Administered 2018-05-20 (×2): 10 meq via INTRAVENOUS
  Filled 2018-05-20 (×2): qty 100

## 2018-05-20 NOTE — Care Management Note (Addendum)
Case Management Note  Patient Details  Name: Dana [name] MRN: [id number] Date of Birth: Jun 07, 1956  Subjective/Objective:       Patient admitted to [facility] for [name] fib. RNCM consulted on patient for possible HH needs. Patient lives in [name] "handicapped unit" with her boyfriend Dana [name] [phone] where everything is easily accessible. She currently has [name] cane, BSC and shower chair.REquires assistance from boyfriend to complete activities of daily living.  She has no insurance but has access to Doctors visits and medications through the [facility]. Her and her boyfriend drive but she does wish for more resources, CM will provide. Denies any falls or weight loss. PT eval pending.           Action/Plan: Will provide list of transportation companies in the community as well as the information to apply for medicaid. Will await PT eval.   Expected Discharge Date:  05/21/18               Expected Discharge Plan:     In-House Referral:     Discharge planning Services     Post Acute Care Choice:    Choice offered to:     DME Arranged:    DME Agency:     HH Arranged:    HH Agency:     Status of Service:     If discussed at [employer], dates discussed:    Additional Comments:  Dana [name] A, RN 05/20/2018, 3:55 PM

## 2018-05-20 NOTE — Progress Notes (Signed)
[place] NOTE   Pharmacy Consult for Electrolytes Indication: hypokalemia  No Known Allergies  Labs: Recent Labs    05/19/18 1401 05/19/18 1833 05/20/18 0510  WBC 7.6  --   --   HGB 15.7  --   --   HCT 45.8  --   --   PLT 250  --   --   CREATININE 1.01*  --  0.71  MG  --  1.1* 2.6*  PHOS  --  3.0  --    Estimated Creatinine Clearance: 74.8 mL/min (by C-G formula based on SCr of 0.71 mg/dL).  Sodium (mmol/L)  Date Value  05/20/2018 138   Potassium (mmol/L)  Date Value  05/20/2018 3.5   Magnesium (mg/dL)  Date Value  05/20/2018 2.6 (H)   Calcium (mg/dL)  Date Value  05/20/2018 7.9 (L)   Albumin (g/dL)  Date Value  11/26/2017 3.7    Assessment: 62 yo female admitted with generalized weakness. She has a known history of alcohol abuse, a fib, and cirrhosis of liver. Pharmacy has been consulted for electrolyte replacement and management.    Goal of Therapy:  K = 3.5 - 5.1 Mg = 1.7 - 2.4  Plan:  6/1 K: 3.5, Mg:2.6 Patient has KCL 36mEq BID ordered. No additional replacement needed.Will recheck labs in [place], PharmD, BCPS Clinical Pharmacist 05/20/2018 5:43 AM

## 2018-05-20 NOTE — Progress Notes (Signed)
PT Cancellation Note  Patient Details Name: [name] MRN: [id number] DOB: 1956/09/09   Cancelled Treatment:    Reason Eval/Treat Not Completed: Other (comment) PT chart reviewed pt and pt is awaiting cardio consult (order placed today). Pt's HR has been fluctuating, therefore, PT will reattempt eval (at later date) once cardio has assessed and cleared pt. Pt has hx of a-fib.   Geoffry Paradise, PT,DPT 05/20/18 2:26 PM      Keziah Drotar L 05/20/2018, 2:25 PM

## 2018-05-20 NOTE — Progress Notes (Signed)
Talked to Dr. Anselm Jungling and clarify the order for patient CBG and insulin, patient is not diabetic and blood sugar has been ok, order to discontinue. Also patient is requesting for ensure, order given. RN will continue to monitor.

## 2018-05-21 LAB — CBC
HEMATOCRIT: 35.2 % (ref 35.0–47.0)
Hemoglobin: 12 g/dL (ref 12.0–16.0)
MCH: 34.7 pg — AB (ref 26.0–34.0)
MCHC: 34 g/dL (ref 32.0–36.0)
MCV: 102.2 fL — ABNORMAL HIGH (ref 80.0–100.0)
Platelets: 156 10*3/uL (ref 150–440)
RBC: 3.44 MIL/uL — ABNORMAL LOW (ref 3.80–5.20)
RDW: 13.6 % (ref 11.5–14.5)
WBC: 4.2 10*3/uL (ref 3.6–11.0)

## 2018-05-21 LAB — BASIC METABOLIC PANEL
Anion gap: 7 (ref 5–15)
BUN: 8 mg/dL (ref 6–20)
CHLORIDE: 104 mmol/L (ref 101–111)
CO2: 25 mmol/L (ref 22–32)
CREATININE: 0.74 mg/dL (ref 0.44–1.00)
Calcium: 8.3 mg/dL — ABNORMAL LOW (ref 8.9–10.3)
GFR calc non Af Amer: >60 mL/min (ref >60)
Glucose, Bld: 75 mg/dL (ref 65–99)
POTASSIUM: 3.7 mmol/L (ref 3.5–5.1)
Sodium: 136 mmol/L (ref 135–145)

## 2018-05-21 MED ORDER — THIAMINE HCL 100 MG PO TABS: 100.0000 mg | ORAL_TABLET | Freq: Every day | ORAL | 0 refills | Status: DC

## 2018-05-21 MED ORDER — ADULT MULTIVITAMIN W/MINERALS CH: 1.0000 | ORAL_TABLET | Freq: Every day | ORAL | 0 refills | Status: DC

## 2018-05-21 MED ORDER — FOLIC ACID 1 MG PO TABS: 1.0000 mg | ORAL_TABLET | Freq: Every day | ORAL | 0 refills | Status: DC

## 2018-05-21 MED ORDER — ENSURE ENLIVE PO LIQD: 237.0000 mL | Freq: Two times a day (BID) | ORAL | 12 refills | Status: DC

## 2018-05-21 MED ORDER — DILTIAZEM HCL ER COATED BEADS 120 MG PO CP24: 120.0000 mg | ORAL_CAPSULE | Freq: Every day | ORAL | 0 refills | Status: DC

## 2018-05-21 NOTE — Care Management Note (Signed)
Case Management Note  Patient Details  Name: [name] MRN: [id number] Date of Birth: [birth year]  Subjective/Objective:       Patient to be discharged with HHPT.PT recommends HHPT. Patient is without insurance. Spoke with Brenton Grills from [place] who assessed and approved patient for charity care services. Also provided patient with coupon for new script Cardizem. No further needs. RNCM signed off Ines Bloomer, RN BSN RNCM [phone]           Action/Plan:   Expected Discharge Date:  05/21/18               Expected Discharge Plan:  [place]  In-House Referral:     Discharge planning Services  CM Consult  Post Acute Care Choice:  Home Health Choice offered to:  Patient  DME Arranged:    DME Agency:     HH Arranged:  PT [place]:  [place]  Status of Service:  Completed, signed off  If discussed at [place] of Stay Meetings, dates discussed:    Additional Comments:  [name] A, RN 05/21/2018, 3:50 PM

## 2018-05-21 NOTE — Evaluation (Signed)
Physical Therapy Evaluation Patient Details Name: [name] MRN: [id number] DOB: 1956-04-08 Today's Date: 05/21/2018   History of Present Illness  Patient is a 62 year old female admitted from home for hypokalemia and atril fibrillation with RVR following c/o weakness.  PMH includes ETOH abuse, depression and CA.  Clinical Impression  Patient is a 62 year old female who lives in a ground floor apartment with her significant other.  She is independent with household mobility with occasional use of SPC at baseline.  Pt in bed and reports no pain upon PT arrival.  She is able to perform bed mobility independently and requires supervision for transfers. Pt presents with weakness of UE and fair strength of LE's bilaterally.  She reports N/T sensation in bilateral feet which she believes to be neuropathy and also reports a Morton's neuroma of R foot.  Pt ambulates 100 ft with frequent use of wall or rail for support, demonstrating unsteadiness on feet and difficulty with dynamic gait activity.  PT provided min A for stability and pt reported fatigue at 50 ft.  Pt demonstrated balance deficits with static and functional activity and also presents with apprehension regarding this type of activity.  PT educated pt concerning safe use of AD.  Pt will continue to benefit from skilled PT with focus on further education concerning use of AD, balance and fall prevention and HEP for strengthening.    Follow Up Recommendations Home health PT    Equipment Recommendations  None recommended by PT    Recommendations for Other Services       Precautions / Restrictions Precautions Precautions: Fall Precaution Comments: High fall risk Restrictions Weight Bearing Restrictions: No      Mobility  Bed Mobility Overal bed mobility: Independent                Transfers Overall transfer level: Needs assistance   Transfers: Sit to/from Stand Sit to Stand: Supervision         General transfer  comment: Able to stand with bilateral UE for support.  Slightly unsteady on feet.  Ambulation/Gait Ambulation/Gait assistance: Min assist Ambulation Distance (Feet): 100 Feet Assistive device: 1 person hand held assist     Gait velocity interpretation: <1.8 ft/sec, indicate of risk for recurrent falls General Gait Details: Slow step through gait pattern with low foot clearance, occasional crossover gait and 4-5 lateral LOB's when pt looks side to side or waves at somebody.  Pt is able to self correct and holds wall or rail frequently.  Reports fatigue halfway through but does not take a rest break.  Stairs            Wheelchair Mobility    Modified Rankin (Stroke Patients Only)       Balance Overall balance assessment: Needs assistance Sitting-balance support: Feet supported Sitting balance-Leahy Scale: Good     Standing balance support: Single extremity supported Standing balance-Leahy Scale: Fair               High level balance activites: Head turns;Turns High Level Balance Comments: Slows gait for all dynamic gait activity and experiences lateral LOB's which pt is able to self correct, sometimes with use of handrail.  Able to flex shoulders to proper height to open cabinet doors bilaterally and uses unilateral UE for support when squatting to lift cup from floor.             Pertinent Vitals/Pain Pain Assessment: No/denies pain    Home Living Family/patient expects to be  discharged to:: Private residence Living Arrangements: Spouse/significant other(Boyfriend-in poor health) Available Help at Discharge: Family Type of Home: Apartment(Lives downstairs in "handicap unit".) Home Access: Level entry     Home Layout: One level Home Equipment: Bedside commode;Cane - single point;Tub bench;Hand held shower head Additional Comments: Only uses SPC intermittently.  States she has been "unpredictable" and has had 8-10 falls in the past 6 mo.  This happens when  her postassium bottoms out and she blacks out.    Prior Function Level of Independence: Independent with assistive device(s)               Hand Dominance        Extremity/Trunk Assessment   Upper Extremity Assessment Upper Extremity Assessment: Generalized weakness(Grossly 4-/5 bilaterally)    Lower Extremity Assessment Lower Extremity Assessment: Overall WFL for tasks assessed(Grossly 4/5 bilaterally-reports mild N/T in feet and reports that she has a Morton's neuroma in her R foot.)    Cervical / Trunk Assessment Cervical / Trunk Assessment: Normal  Communication   Communication: No difficulties  Cognition Arousal/Alertness: Awake/alert Behavior During Therapy: WFL for tasks assessed/performed Overall Cognitive Status: Within Functional Limits for tasks assessed                                        General Comments      Exercises Other Exercises Other Exercises: Educated pt concerning proper use of AD and benefit of using RW properly.   Assessment/Plan    PT Assessment Patient needs continued PT services  PT Problem List Decreased strength;Decreased mobility;Decreased balance;Decreased knowledge of use of DME;Decreased activity tolerance       PT Treatment Interventions DME instruction;Therapeutic activities;Gait training;Therapeutic exercise;Stair training;Neuromuscular re-education;Functional mobility training;Balance training;Patient/family education    PT Goals (Current goals can be found in the Care Plan section)  Acute Rehab PT Goals Patient Stated Goal: To return to safe mobility in her home. PT Goal Formulation: With patient Time For Goal Achievement: 06/04/18 Potential to Achieve Goals: Good    Frequency Min 2X/week   Barriers to discharge        Co-evaluation               AM-PAC PT "6 Clicks" Daily Activity  Outcome Measure Difficulty turning over in bed (including adjusting bedclothes, sheets and blankets)?:  None Difficulty moving from lying on back to sitting on the side of the bed? : A Little Difficulty sitting down on and standing up from a chair with arms (e.g., wheelchair, bedside commode, etc,.)?: A Little Help needed moving to and from a bed to chair (including a wheelchair)?: A Little Help needed walking in hospital room?: A Little Help needed climbing 3-5 steps with a railing? : A Little 6 Click Score: 19    End of Session Equipment Utilized During Treatment: Gait belt Activity Tolerance: Patient limited by fatigue Patient left: in bed;with call bell/phone within reach;with bed alarm set Nurse Communication: Mobility status PT Visit Diagnosis: Unsteadiness on feet (R26.81);Muscle weakness (generalized) (M62.81)    Time: 9211-9417 PT Time Calculation (min) (ACUTE ONLY): 25 min   Charges:   PT Evaluation $PT Eval Low Complexity: 1 Low PT Treatments $Therapeutic Activity: 8-22 mins   PT G Codes:   PT G-Codes **NOT FOR INPATIENT CLASS** Functional Assessment Tool Used: AM-PAC 6 Clicks Basic Mobility    Roxanne Gates, PT, DPT   Roxanne Gates 05/21/2018, 9:28 AM

## 2018-05-21 NOTE — Progress Notes (Signed)
[place] [facility] [place] NAME: [name]    MR#:  [id number]  DATE OF BIRTH:  1956-04-10  SUBJECTIVE:  CHIEF COMPLAINT:   Chief Complaint  Patient presents with  . Weakness   Alcohol abuse and cirrhosis, came with weakness. Noted to have severe electrolyte imbalance.   Improving now. Still have tachycardia.  REVIEW OF SYSTEMS:  CONSTITUTIONAL: No fever,have fatigue or weakness.  EYES: No blurred or double vision.  EARS, NOSE, AND THROAT: No tinnitus or ear pain.  RESPIRATORY: No cough, shortness of breath, wheezing or hemoptysis.  CARDIOVASCULAR: No chest pain, orthopnea, edema.  GASTROINTESTINAL: No nausea, vomiting, diarrhea or abdominal pain.  GENITOURINARY: No dysuria, hematuria.  ENDOCRINE: No polyuria, nocturia,  HEMATOLOGY: No anemia, easy bruising or bleeding SKIN: No rash or lesion. MUSCULOSKELETAL: No joint pain or arthritis.   NEUROLOGIC: No tingling, numbness, weakness.  PSYCHIATRY: No anxiety or depression.   ROS  DRUG ALLERGIES:  No Known Allergies  VITALS:  Blood pressure 110/78, pulse 85, temperature 98.4 F (36.9 C), temperature source Oral, resp. rate 18, height 5\' 9"  (1.753 m), weight 65 kg (143 lb 4.8 oz), SpO2 100 %.  PHYSICAL EXAMINATION:  GENERAL:  62 y.o.-year-old patient lying in the bed with no acute distress.  EYES: Pupils equal, round, reactive to light and accommodation. No scleral icterus. Extraocular muscles intact.  HEENT: Head atraumatic, normocephalic. Oropharynx and nasopharynx clear.  NECK:  Supple, no jugular venous distention. No thyroid enlargement, no tenderness.  LUNGS: Normal breath sounds bilaterally, no wheezing, rales,rhonchi or crepitation. No use of accessory muscles of respiration.  CARDIOVASCULAR: S1, S2 fast. No murmurs, rubs, or gallops.  ABDOMEN: Soft, nontender, nondistended. Bowel sounds present. No organomegaly or mass.  EXTREMITIES: No pedal edema, cyanosis, or clubbing.   NEUROLOGIC: Cranial nerves II through XII are intact. Muscle strength 4/5 in all extremities. Sensation intact. Gait not checked.  PSYCHIATRIC: The patient is alert and oriented x 3.  SKIN: No obvious rash, lesion, or ulcer.   Physical Exam LABORATORY PANEL:   CBC Recent Labs  Lab 05/21/18 0642  WBC 4.2  HGB 12.0  HCT 35.2  PLT 156   ------------------------------------------------------------------------------------------------------------------  Chemistries  Recent Labs  Lab 05/20/18 0510 05/21/18 0642  NA 138 136  K 3.5 3.7  CL 103 104  CO2 25 25  GLUCOSE 84 75  BUN 11 8  CREATININE 0.71 0.74  CALCIUM 7.9* 8.3*  MG 2.6*  --    ------------------------------------------------------------------------------------------------------------------  Cardiac Enzymes Recent Labs  Lab 05/19/18 1548  TROPONINI <0.03   ------------------------------------------------------------------------------------------------------------------  RADIOLOGY:  Dg Chest Portable 1 View  Result Date: 05/19/2018 CLINICAL DATA:  Generalized weakness EXAM: PORTABLE CHEST 1 VIEW COMPARISON:  None. FINDINGS: The heart size and mediastinal contours are within normal limits. Both lungs are clear. The visualized skeletal structures are unremarkable. Linear density right medial lung base probably represents scarring. IMPRESSION: No active disease. Electronically Signed   By: Franchot Gallo M.D.   On: 05/19/2018 16:17    ASSESSMENT AND PLAN:   Active Problems:   A-fib (HCC)  [name]  is a 62 y.o. female with a known history of alcohol abuse, atrial fibrillation, cirrhosis of liver to the emergency room with complaints of weakness and dizziness that has been going since December of last year. Patient seems to be a poor historian not taking care of herself has history of chronic alcoholism and drinks vodka on a regular basis.  1. a fib with RVR/tachyarrhythmia  -  started patient on oral  diltiazem 30 mg TID. Changed to Long acting cardizem -Replaced electrolytes -continue aspirin - cardiology consultation   Echo in dec 2018- was EF 60%, no much abnormalities.  2. chronic alcoholism with alcoholic ketoacidosis -patient drinks vodka on a regular basis. She does not quantify -anion gap 23 -continue IV fluids -encouraged patient to eat regular diet -multivitamin, folic acid, thiamine  No signs of withdrawal.  3. Tobacco abuse -advised smoking cessation for 4 min.  4. Cirrhosis of liver--- alcohol induced -advised abstinence  5. DVT prophylaxis subcu Lovenox  6. Hyperglycemia with no history of diabetes -monitor sugars -check A1c -sliding scale insulin   7. Hypokalemia , hypomagnesemia    Replaced.    All the records are reviewed and case discussed with Care Management/Social Workerr. Management plans discussed with the patient, family and they are in agreement.  CODE STATUS: Full.  TOTAL TIME TAKING CARE OF THIS PATIENT: 35  minutes.    POSSIBLE D/C IN 1-2 DAYS, DEPENDING ON CLINICAL CONDITION.   Vaughan Basta M.D on 05/21/2018   Between 7am to 6pm - Pager - [phone]  After 6pm go to [email or web address] - password [email or web address] [facility]  Office  [phone]  CC: Primary care physician; Herminio Commons, MD  Note: This dictation was prepared with Dragon dictation along with smaller phrase technology. Any transcriptional errors that result from this process are unintentional.

## 2018-05-21 NOTE — Progress Notes (Signed)
IV and tele removed. Prescriptions given to pt. Home health was set up per [facility] CM. Room air. NSR. A & O. Takes meds ok. Up in hallway with PT. Discharge instructions given to pt. Pt has no further concerns at this time. Friend to take pt home.

## 2018-05-21 NOTE — Consult Note (Signed)
[facility] Cardiology Consultation Note  Patient ID: [name], MRN: [id number], DOB/AGE: 08-31-1956 62 y.o. Admit date: 05/19/2018   Date of Consult: 05/21/2018 Primary Physician: Herminio Commons, MD Primary Cardiologist: None  Chief Complaint:  Chief Complaint  Patient presents with  . Weakness   Reason for Consult: Atrial fibrillation  HPI: 62 y.o. female with known chronic alcohol abuse who has had a recent episode of paroxysmal nonvalvular atrial fibrillation over the last 1 to 2 months and seen in outpatient clinic.  At that time the patient had taken metoprolol and did maintain normal rhythm but she could not tolerate the medication.  Therefore she discontinued.  Then she had weakness fatigue palpitations irregular heartbeat and could not tolerate it anymore and was seen in the clinic again with atrial fibrillation with rapid ventricular rate.  Apparently on transfer to the emergency room she did spontaneously convert to normal sinus rhythm and has continued on diltiazem with maintenance of normal sinus rhythm at this time.  The patient has had known alcohol abuse but no evidence of other cardiovascular disease concerns.  Troponin was normal and she has no current evidence of congestive heart failure  Past Medical History:  Diagnosis Date  . Cancer ([place])    skin  . Depression   . ETOH abuse       Surgical History:  Past Surgical History:  Procedure Laterality Date  . MOLE REMOVAL       Home Meds: Prior to Admission medications   Medication Sig Start Date End Date Taking? Authorizing Provider  aspirin EC 81 MG EC tablet Take 1 tablet (81 mg total) by mouth daily. 11/29/17  Yes [name], MD  metoprolol tartrate (LOPRESSOR) 25 MG tablet Take 0.5 tablets (12.5 mg total) by mouth 2 (two) times daily. 11/28/17  Yes [name], MD  NASONEX 50 MCG/ACT nasal spray Place 2 sprays into both nostrils daily. 10/18/17  Yes [provider]  traZODone (DESYREL) 100 MG  tablet Take 100 mg by mouth at bedtime as needed. for sleep 10/07/17  Yes [provider]  venlafaxine XR (EFFEXOR-XR) 75 MG 24 hr capsule Take 225 mg by mouth daily.    Yes [provider]    Inpatient Medications:  . aspirin  81 mg Oral Daily  . diltiazem  120 mg Oral Daily  . enoxaparin (LOVENOX) injection  40 mg Subcutaneous Q24H  . famotidine  20 mg Oral BID  . feeding supplement (ENSURE ENLIVE)  237 mL Oral BID BM  . folic acid  1 mg Oral Daily  . multivitamin with minerals  1 tablet Oral Daily  . potassium chloride  20 mEq Oral BID  . senna  1 tablet Oral BID  . thiamine  100 mg Oral Daily  . venlafaxine XR  225 mg Oral QHS     Allergies: No Known Allergies  Social History   Socioeconomic History  . Marital status: Single    Spouse name: Not on file  . Number of children: Not on file  . Years of education: Not on file  . Highest education level: Not on file  Occupational History  . Not on file  Social Needs  . Financial resource strain: Not on file  . Food insecurity:    Worry: Not on file    Inability: Not on file  . Transportation needs:    Medical: Not on file    Non-medical: Not on file  Tobacco Use  . Smoking status: Current Every  Day Smoker    Packs/day: 0.25    Types: Cigarettes  . Smokeless tobacco: Never Used  Substance and Sexual Activity  . Alcohol use: Yes    Alcohol/week: 0.6 oz    Types: 1 Shots of liquor per week  . Drug use: No  . Sexual activity: Never  Lifestyle  . Physical activity:    Days per week: Not on file    Minutes per session: Not on file  . Stress: Not on file  Relationships  . Social connections:    Talks on phone: Not on file    Gets together: Not on file    Attends religious service: Not on file    Active member of club or organization: Not on file    Attends meetings of clubs or organizations: Not on file    Relationship status: Not on file  . Intimate partner violence:    Fear of current or ex  partner: Not on file    Emotionally abused: Not on file    Physically abused: Not on file    Forced sexual activity: Not on file  Other Topics Concern  . Not on file  Social History Narrative  . Not on file     Family History  Problem Relation Age of Onset  . Breast cancer Neg Hx      Review of Systems Positive for palpitations Negative for: General:  chills, fever, night sweats or weight changes.  Cardiovascular: PND orthopnea syncope dizziness  Dermatological skin lesions rashes Respiratory: Cough congestion Urologic: Frequent urination urination at night and hematuria Abdominal: negative for nausea, vomiting, diarrhea, bright red blood per rectum, melena, or hematemesis Neurologic: negative for visual changes, and/or hearing changes  All other systems reviewed and are otherwise negative except as noted above.  Labs: Recent Labs    05/19/18 1548  TROPONINI <0.03   Lab Results  Component Value Date   WBC 4.2 05/21/2018   HGB 12.0 05/21/2018   HCT 35.2 05/21/2018   MCV 102.2 (H) 05/21/2018   PLT 156 05/21/2018    Recent Labs  Lab 05/21/18 0642  NA 136  K 3.7  CL 104  CO2 25  BUN 8  CREATININE 0.74  CALCIUM 8.3*  GLUCOSE 75   No results found for: CHOL, HDL, LDLCALC, TRIG No results found for: DDIMER  Radiology/Studies:  Dg Chest Portable 1 View  Result Date: 05/19/2018 CLINICAL DATA:  Generalized weakness EXAM: PORTABLE CHEST 1 VIEW COMPARISON:  None. FINDINGS: The heart size and mediastinal contours are within normal limits. Both lungs are clear. The visualized skeletal structures are unremarkable. Linear density right medial lung base probably represents scarring. IMPRESSION: No active disease. Electronically Signed   By: Franchot Gallo M.D.   On: 05/19/2018 16:17    EKG: Sinus tachycardia with nonspecific ST changes  Weights: Filed Weights   05/19/18 1400 05/19/18 2015 05/20/18 0354  Weight: 150 lb (68 kg) 143 lb 4.8 oz (65 kg) 143 lb 4.8 oz (65  kg)     Physical Exam: Blood pressure 110/78, pulse 85, temperature 98.4 F (36.9 C), temperature source Oral, resp. rate 18, height 5\' 9"  (1.753 m), weight 143 lb 4.8 oz (65 kg), SpO2 100 %. Body mass index is 21.16 kg/m. General: Well developed, well nourished, in no acute distress. Head eyes ears nose throat: Normocephalic, atraumatic, sclera non-icteric, no xanthomas, nares are without discharge. No apparent thyromegaly and/or mass  Lungs: Normal respiratory effort.  no wheezes, no rales, no rhonchi.  Heart:  RRR with normal S1 S2. no murmur gallop, no rub, PMI is normal size and placement, carotid upstroke normal without bruit, jugular venous pressure is normal Abdomen: Soft, non-tender, non-distended with normoactive bowel sounds. No hepatomegaly. No rebound/guarding. No obvious abdominal masses. Abdominal aorta is normal size without bruit Extremities: No edema. no cyanosis, no clubbing, no ulcers  Peripheral : 2+ bilateral upper extremity pulses, 2+ bilateral femoral pulses, 2+ bilateral dorsal pedal pulse Neuro: Alert and oriented. No facial asymmetry. No focal deficit. Moves all extremities spontaneously. Musculoskeletal: Normal muscle tone without kyphosis Psych:  Responds to questions appropriately with a normal affect.    Assessment: 62 year old female with paroxysmal nonvalvular atrial fibrillation now converted to normal sinus rhythm and no current evidence of congestive heart failure or myocardial infarction  Plan: 1.  Continue maintenance of normal sinus rhythm with low-dose diltiazem 2.  No additional treatment with anticoagulation due to low CHADS2 score and concerns of falling with alcohol abuse 3.  Patient was counseled on alcohol abuse and concerns with atrial fibrillation 4.  Begin ambulation and follow for improvements of symptoms and okay for discharge home from cardiac standpoint with follow-up next week  Signed, Corey Skains M.D. [facility]  Cardiology 05/21/2018, 8:33 AM

## 2018-05-21 NOTE — Progress Notes (Signed)
[place] for Electrolytes Indication: hypokalemia  No Known Allergies  Labs: Recent Labs    05/19/18 1401 05/19/18 1833 05/20/18 0510 05/21/18 0642  WBC 7.6  --   --  4.2  HGB 15.7  --   --  12.0  HCT 45.8  --   --  35.2  PLT 250  --   --  156  CREATININE 1.01*  --  0.71 0.74  MG  --  1.1* 2.6*  --   PHOS  --  3.0  --   --    Estimated Creatinine Clearance: 74.8 mL/min (by C-G formula based on SCr of 0.74 mg/dL).  Sodium (mmol/L)  Date Value  05/21/2018 136   Potassium (mmol/L)  Date Value  05/21/2018 3.7   Magnesium (mg/dL)  Date Value  05/20/2018 2.6 (H)   Calcium (mg/dL)  Date Value  05/21/2018 8.3 (L)   Albumin (g/dL)  Date Value  11/26/2017 3.7    Assessment: 62 yo female admitted with generalized weakness. She has a known history of alcohol abuse, a fib, and cirrhosis of liver. Pharmacy has been consulted for electrolyte replacement and management.    Goal of Therapy:  K = 3.5 - 5.1 Mg = 1.7 - 2.4  Plan:  Patient has KCL 74mEq BID ordered. No additional replacement needed at this time.Will recheck labs in AM.   Noralee Space, PharmD, BCPS Clinical Pharmacist 05/21/2018 11:34 AM

## 2018-05-26 NOTE — Discharge Summary (Signed)
[facility] NAME: [name]    MR#:  [id number]  DATE OF BIRTH:  1956-09-27  DATE OF ADMISSION:  05/19/2018 ADMITTING PHYSICIAN: Fritzi Mandes, MD  DATE OF DISCHARGE: 05/21/2018  2:07 PM  PRIMARY CARE PHYSICIAN: Soles, Howell Rucks, MD    ADMISSION DIAGNOSIS:  Hypokalemia [E87.6] Atrial fibrillation with RVR ([place]) [I48.91]  DISCHARGE DIAGNOSIS:  Active Problems:   A-fib ([place])   SECONDARY DIAGNOSIS:   Past Medical History:  Diagnosis Date  . Cancer ([place])    skin  . Depression   . ETOH abuse     HOSPITAL COURSE:   KarenGroveris a61 y.o.femalewith a known history of alcohol abuse, atrial fibrillation, cirrhosis of liverto the emergency room with complaints of weakness and dizziness that has been going since December of last year. Patient seems to be a poor historian not taking care of herself has history of chronic alcoholism and drinks vodka on a regular basis.  1.a fib with RVR/tachyarrhythmia -started patient on oral diltiazem 30 mg TID. Changed to Long acting cardizem -Replaced electrolytes -continue aspirin - cardiology consultation   Echo in dec 2018- was EF 60%, no much abnormalities.  2.chronic alcoholism with alcoholic ketoacidosis -patient drinks vodka on a regular basis. She does not quantify -anion gap 23 -continue IV fluids -encouragedpatient to eat regular diet -multivitamin, folic acid, thiamine  No signs of withdrawal.  3.Tobacco abuse -advised smoking cessation for 4 min.  4.Cirrhosis of liver---alcohol induced -advised abstinence  5.DVT prophylaxis subcu Lovenox  6.Hyperglycemia with no history of diabetes -monitor sugars -check A1c -slidingscale insulin  7. Hypokalemia , hypomagnesemia    Replaced.   DISCHARGE CONDITIONS:   Stable.  CONSULTS OBTAINED:  Treatment Team:  Dana Skains, MD  DRUG ALLERGIES:  No Known Allergies  DISCHARGE  MEDICATIONS:   Allergies as of 05/21/2018   No Known Allergies     Medication List    STOP taking these medications   metoprolol tartrate 25 MG tablet Commonly known as:  LOPRESSOR   NASONEX 50 MCG/ACT nasal spray Generic drug:  mometasone     TAKE these medications   aspirin 81 MG EC tablet Take 1 tablet (81 mg total) by mouth daily.   diltiazem 120 MG 24 hr capsule Commonly known as:  CARDIZEM CD Take 1 capsule (120 mg total) by mouth daily.   feeding supplement (ENSURE ENLIVE) Liqd Take 237 mLs by mouth 2 (two) times daily between meals.   folic acid 1 MG tablet Commonly known as:  FOLVITE Take 1 tablet (1 mg total) by mouth daily.   multivitamin with minerals Tabs tablet Take 1 tablet by mouth daily.   thiamine 100 MG tablet Take 1 tablet (100 mg total) by mouth daily.   traZODone 100 MG tablet Commonly known as:  DESYREL Take 100 mg by mouth at bedtime as needed. for sleep   venlafaxine XR 75 MG 24 hr capsule Commonly known as:  EFFEXOR-XR Take 225 mg by mouth daily.        DISCHARGE INSTRUCTIONS:    Follow with PMD and cardio clinic in 1-2 weeks.  If you experience worsening of your admission symptoms, develop shortness of breath, life threatening emergency, suicidal or homicidal thoughts you must seek medical attention immediately by calling 911 or calling your MD immediately  if symptoms less severe.  You Must read complete instructions/literature along with all the possible adverse reactions/side effects for all the Medicines you take and that  have been prescribed to you. Take any new Medicines after you have completely understood and accept all the possible adverse reactions/side effects.   Please note  You were cared for by a hospitalist during your hospital stay. If you have any questions about your discharge medications or the care you received while you were in the hospital after you are discharged, you can call the unit and asked to speak with  the hospitalist on call if the hospitalist that took care of you is not available. Once you are discharged, your primary care physician will handle any further medical issues. Please note that NO REFILLS for any discharge medications will be authorized once you are discharged, as it is imperative that you return to your primary care physician (or establish a relationship with a primary care physician if you do not have one) for your aftercare needs so that they can reassess your need for medications and monitor your lab values.    Today   CHIEF COMPLAINT:   Chief Complaint  Patient presents with  . Weakness    HISTORY OF PRESENT ILLNESS:  [name]  is a 62 y.o. female with a known history of alcohol abuse, atrial fibrillation, cirrhosis of liver to the emergency room with complaints of weakness and dizziness that has been going since December of last year. Patient seems to be a poor historian not taking care of herself has history of chronic alcoholism and drinks vodka on a regular basis. Patient does not quantify how much - she drinks. She tells me she has gotten a lot better than before. She lives with a roommate who is disabled and also has history of alcoholism. In the ER patient was found to be in a fib with RVR heart rate was in the 150 she received IV metoprolol during my evaluation blood pressure was 108/93 heart rate was in the 100s. She is being admitted for further evaluation of management. She was found to have low potassium.  I have ordered magnesium, phosphorus, serum alcohol and urine drug screen which is still pending.   VITAL SIGNS:  Blood pressure 110/78, pulse 85, temperature 98.4 F (36.9 C), temperature source Oral, resp. rate 18, height 5\' 9"  (1.753 m), weight 65 kg (143 lb 4.8 oz), SpO2 100 %.  I/O:  No intake or output data in the 24 hours ending 05/26/18 1307  PHYSICAL EXAMINATION:  GENERAL:  62 y.o.-year-old patient lying in the bed with no acute distress.   EYES: Pupils equal, round, reactive to light and accommodation. No scleral icterus. Extraocular muscles intact.  HEENT: Head atraumatic, normocephalic. Oropharynx and nasopharynx clear.  NECK:  Supple, no jugular venous distention. No thyroid enlargement, no tenderness.  LUNGS: Normal breath sounds bilaterally, no wheezing, rales,rhonchi or crepitation. No use of accessory muscles of respiration.  CARDIOVASCULAR: S1, S2 normal. No murmurs, rubs, or gallops.  ABDOMEN: Soft, non-tender, non-distended. Bowel sounds present. No organomegaly or mass.  EXTREMITIES: No pedal edema, cyanosis, or clubbing.  NEUROLOGIC: Cranial nerves II through XII are intact. Muscle strength 5/5 in all extremities. Sensation intact. Gait not checked.  PSYCHIATRIC: The patient is alert and oriented x 3.  SKIN: No obvious rash, lesion, or ulcer.   DATA REVIEW:   CBC Recent Labs  Lab 05/21/18 0642  WBC 4.2  HGB 12.0  HCT 35.2  PLT 156    Chemistries  Recent Labs  Lab 05/20/18 0510 05/21/18 0642  NA 138 136  K 3.5 3.7  CL 103 104  CO2  25 25  GLUCOSE 84 75  BUN 11 8  CREATININE 0.71 0.74  CALCIUM 7.9* 8.3*  MG 2.6*  --     Cardiac Enzymes Recent Labs  Lab 05/19/18 1548  TROPONINI <0.03    Microbiology Results  No results found for this or any previous visit.  RADIOLOGY:  No results found.  EKG:   Orders placed or performed during the hospital encounter of 05/19/18  . EKG 12-Lead  . EKG 12-Lead  . ED EKG  . ED EKG  . EKG      Management plans discussed with the patient, family and they are in agreement.  CODE STATUS:  Code Status History    Date Active Date Inactive Code Status Order ID Comments User Context   05/19/2018 1958 05/21/2018 1712 Full Code [id number]  Fritzi Mandes, MD Inpatient   11/27/2017 0109 11/29/2017 1844 Full Code [id number]  Gorden Harms, MD Inpatient   09/26/2016 1313 09/27/2016 1802 Full Code [id number]  Dustin Flock, MD ED      TOTAL TIME TAKING CARE  OF THIS PATIENT: 35 minutes.    Vaughan Basta M.D on 05/26/2018 at 1:07 PM  Between 7am to 6pm - Pager - [phone]  After 6pm go to [email or web address] - password [email or web address] [facility]  Office  [phone]  CC: Primary care physician; Herminio Commons, MD   Note: This dictation was prepared with Dragon dictation along with smaller phrase technology. Any transcriptional errors that result from this process are unintentional.

## 2018-06-07 ENCOUNTER — Ambulatory Visit: Payer: Self-pay

## 2018-07-26 ENCOUNTER — Ambulatory Visit: Payer: Self-pay | Attending: Oncology

## 2018-08-26 ENCOUNTER — Emergency Department
Admission: EM | Admit: 2018-08-26 | Discharge: 2018-08-27 | Disposition: A | Discharge status: Home / Self Care | Payer: Self-pay | Attending: Emergency Medicine | Admitting: Emergency Medicine

## 2018-08-26 ENCOUNTER — Emergency Department: Payer: Self-pay

## 2018-08-26 DIAGNOSIS — Z79899 Other long term (current) drug therapy: Secondary | ICD-10-CM | POA: Insufficient documentation

## 2018-08-26 DIAGNOSIS — I482 Chronic atrial fibrillation, unspecified: Secondary | ICD-10-CM

## 2018-08-26 DIAGNOSIS — Y929 Unspecified place or not applicable: Secondary | ICD-10-CM | POA: Insufficient documentation

## 2018-08-26 DIAGNOSIS — Y939 Activity, unspecified: Secondary | ICD-10-CM | POA: Insufficient documentation

## 2018-08-26 DIAGNOSIS — Y999 Unspecified external cause status: Secondary | ICD-10-CM | POA: Insufficient documentation

## 2018-08-26 DIAGNOSIS — Z85828 Personal history of other malignant neoplasm of skin: Secondary | ICD-10-CM | POA: Insufficient documentation

## 2018-08-26 DIAGNOSIS — F17210 Nicotine dependence, cigarettes, uncomplicated: Secondary | ICD-10-CM | POA: Insufficient documentation

## 2018-08-26 DIAGNOSIS — Z7982 Long term (current) use of aspirin: Secondary | ICD-10-CM | POA: Insufficient documentation

## 2018-08-26 DIAGNOSIS — W1789XA Other fall from one level to another, initial encounter: Secondary | ICD-10-CM | POA: Insufficient documentation

## 2018-08-26 DIAGNOSIS — S20212A Contusion of left front wall of thorax, initial encounter: Secondary | ICD-10-CM | POA: Insufficient documentation

## 2018-08-26 LAB — CBC
HEMATOCRIT: 37.9 % (ref 35.0–47.0)
HEMOGLOBIN: 13.3 g/dL (ref 12.0–16.0)
MCH: 35.1 pg — AB (ref 26.0–34.0)
MCHC: 35.2 g/dL (ref 32.0–36.0)
MCV: 99.8 fL (ref 80.0–100.0)
Platelets: 262 10*3/uL (ref 150–440)
RBC: 3.79 MIL/uL — ABNORMAL LOW (ref 3.80–5.20)
RDW: 13 % (ref 11.5–14.5)
WBC: 8.2 10*3/uL (ref 3.6–11.0)

## 2018-08-26 LAB — COMPREHENSIVE METABOLIC PANEL
ALK PHOS: 175 U/L — AB (ref 38–126)
ALT: 28 U/L (ref 0–44)
AST: 90 U/L — ABNORMAL HIGH (ref 15–41)
Albumin: 2.7 g/dL — ABNORMAL LOW (ref 3.5–5.0)
Anion gap: 12 (ref 5–15)
BILIRUBIN TOTAL: 1.4 mg/dL — AB (ref 0.3–1.2)
BUN: 8 mg/dL (ref 8–23)
CALCIUM: 8.2 mg/dL — AB (ref 8.9–10.3)
CO2: 26 mmol/L (ref 22–32)
Chloride: 103 mmol/L (ref 98–111)
Creatinine, Ser: 0.98 mg/dL (ref 0.44–1.00)
Glucose, Bld: 91 mg/dL (ref 70–99)
Potassium: 4.3 mmol/L (ref 3.5–5.1)
Sodium: 141 mmol/L (ref 135–145)
TOTAL PROTEIN: 6.4 g/dL — AB (ref 6.5–8.1)

## 2018-08-26 LAB — TROPONIN I

## 2018-08-26 LAB — ETHANOL: Alcohol, Ethyl (B): 192 mg/dL — ABNORMAL HIGH (ref <10)

## 2018-08-26 MED ORDER — DILTIAZEM HCL 25 MG/5ML IV SOLN
15.0000 mg | Freq: Once | INTRAVENOUS | Status: AC
  Administered 2018-08-26: 15 mg via INTRAVENOUS
  Filled 2018-08-26: qty 5

## 2018-08-26 MED ORDER — DILTIAZEM HCL 60 MG PO TABS
60.0000 mg | ORAL_TABLET | Freq: Once | ORAL | Status: AC
  Administered 2018-08-26: 60 mg via ORAL
  Filled 2018-08-26: qty 1

## 2018-08-26 MED ORDER — SODIUM CHLORIDE 0.9 % IV BOLUS
500.0000 mL | Freq: Once | INTRAVENOUS | Status: AC
  Administered 2018-08-26: 500 mL via INTRAVENOUS

## 2018-08-26 NOTE — ED Provider Notes (Signed)
[facility] Emergency Department Provider Note   ____________________________________________    I have reviewed the triage vital signs and the nursing notes.   HISTORY  Chief Complaint Chest wall pain    HPI [name] is a 62 y.o. female who presents with complaints of left-sided chest wall pain which occurred after a fall this morning.  States that she fell this morning because she lost her balance and struck her left anterior chest wall against a dresser.  She has had pain in that area since then.  She denies shortness of breath.  No nausea or vomiting.  No abdominal pain.  She is concerned that she may have broken a rib  Past Medical History:  Diagnosis Date  . Cancer ([place])    skin  . Depression   . ETOH abuse     Patient Active Problem List   Diagnosis Date Noted  . A-fib ([place]) 11/26/2017  . Malnutrition of moderate degree 09/27/2016  . Acute pancreatitis 09/26/2016    Past Surgical History:  Procedure Laterality Date  . MOLE REMOVAL      Prior to Admission medications   Medication Sig Start Date End Date Taking? Authorizing Provider  aspirin EC 81 MG EC tablet Take 1 tablet (81 mg total) by mouth daily. 11/29/17   [name], MD  diltiazem (CARDIZEM CD) 120 MG 24 hr capsule Take 1 capsule (120 mg total) by mouth daily. 05/22/18   Vaughan Basta, MD  feeding supplement, ENSURE ENLIVE, (ENSURE ENLIVE) LIQD Take 237 mLs by mouth 2 (two) times daily between meals. 05/21/18   Vaughan Basta, MD  folic acid (FOLVITE) 1 MG tablet Take 1 tablet (1 mg total) by mouth daily. 05/22/18   Vaughan Basta, MD  Multiple Vitamin (MULTIVITAMIN WITH MINERALS) TABS tablet Take 1 tablet by mouth daily. 05/22/18   Vaughan Basta, MD  thiamine 100 MG tablet Take 1 tablet (100 mg total) by mouth daily. 05/22/18   Vaughan Basta, MD  traZODone (DESYREL) 100 MG tablet Take 100 mg by mouth at bedtime as needed. for sleep 10/07/17    [provider]  venlafaxine XR (EFFEXOR-XR) 75 MG 24 hr capsule Take 225 mg by mouth daily.     [provider]     Allergies Patient has no known allergies.  Family History  Problem Relation Age of Onset  . Breast cancer Neg Hx     Social History Social History   Tobacco Use  . Smoking status: Current Every Day Smoker    Packs/day: 0.25    Types: Cigarettes  . Smokeless tobacco: Never Used  Substance Use Topics  . Alcohol use: Yes    Alcohol/week: 1.0 standard drinks    Types: 1 Shots of liquor per week  . Drug use: No    Review of Systems  Constitutional: No dizziness Eyes: No visual changes.  ENT: No neck pain Cardiovascular: As above Respiratory: Denies shortness of breath. Gastrointestinal: No abdominal pain.  Genitourinary: Negative for dysuria. Musculoskeletal: Negative for back pain. Skin: No laceration Neurological: Negative for headaches    ____________________________________________   PHYSICAL EXAM:  VITAL SIGNS: ED Triage Vitals  Enc Vitals Group     BP 08/26/18 2316 111/80     Pulse Rate 08/26/18 2100 94     Resp 08/26/18 2100 11     Temp --      Temp src --      SpO2 08/26/18 2100 98 %     Weight 08/26/18  2052 65.8 kg (145 lb)     Height 08/26/18 2052 1.778 m (5\' 10" )     Head Circumference --      Peak Flow --      Pain Score 08/26/18 2051 0     Pain Loc --      Pain Edu? --      Excl. in [place]? --     Constitutional: Alert and oriented.   Head: Atraumatic. Nose: No congestion/rhinnorhea. Mouth/Throat: Mucous membranes are moist.   Neck: Painless range of motion, no vertebral tenderness palpation Cardiovascular: Normal rate, irregularly irregular rhythm. Grossly normal heart sounds.  Good peripheral circulation.  Tenderness palpation along the left anterior inferior chest wall no bony abnormalities felt Respiratory: Normal respiratory effort.  No retractions.  Gastrointestinal: Soft and nontender. No  distention.   Musculoskeletal:  Warm and well perfused Neurologic:  Normal speech and language. No gross focal neurologic deficits are appreciated.  Skin:  Skin is warm, dry and intact. No rash noted. Psychiatric: Mood and affect are normal. Speech and behavior are normal.  ____________________________________________   LABS (all labs ordered are listed, but only abnormal results are displayed)  Labs Reviewed  CBC - Abnormal; Notable for the following components:      Result Value   RBC 3.79 (*)    MCH 35.1 (*)    All other components within normal limits  COMPREHENSIVE METABOLIC PANEL - Abnormal; Notable for the following components:   Calcium 8.2 (*)    Total Protein 6.4 (*)    Albumin 2.7 (*)    AST 90 (*)    Alkaline Phosphatase 175 (*)    Total Bilirubin 1.4 (*)    All other components within normal limits  ETHANOL - Abnormal; Notable for the following components:   Alcohol, Ethyl (B) 192 (*)    All other components within normal limits  TROPONIN I   ____________________________________________  EKG  ED ECG REPORT I, Lavonia Drafts, the attending physician, personally viewed and interpreted this ECG.  Date: 08/26/2018  Rhythm: Atrial fibrillation QRS Axis: normal Intervals: Abnormal ST/T Wave abnormalities: normal   ____________________________________________  RADIOLOGY  CT chest negative for bony injury, discussed hepatic nodularity with patient and implications for cirrhosis ____________________________________________   PROCEDURES  Procedure(s) performed: No  Procedures   Critical Care performed: No ____________________________________________   INITIAL IMPRESSION / ASSESSMENT AND PLAN / ED COURSE  Pertinent labs & imaging results that were available during my care of the patient were reviewed by me and considered in my medical decision making (see chart for details).  Patient presents with complaints of left chest wall pain status post  fall this morning.  Sent for CT chest negative for acute injury, discussed liver findings with patient after patient was in the emergency department for some time and had been able to sober up.  Brief period where atrial fibrillation with RVR, given 15 mg of IV Cardizem and followed with p.o. Cardizem 60 mg.  She has not taken her Cardizem today which is likely why this occurred.  Right has been arranged for the patient, appropriate for discharge at this time,     ____________________________________________   FINAL CLINICAL IMPRESSION(S) / ED DIAGNOSES  Final diagnoses:  Chest wall contusion, left, initial encounter  Chronic atrial fibrillation [facility])        Note:  This document was prepared using Dragon voice recognition software and may include unintentional dictation errors.    Lavonia Drafts, MD 08/26/18 2322

## 2018-08-26 NOTE — ED Triage Notes (Signed)
Pt arrived via EMS from home with complaints of a fall. Pt states that she was getting up to go to kitchen when she felt dizzy and then she blacked out. Pt denies hitting her head. Pt has complaints of left sided flank pain. Pt has Hx of A-fib. VS per EMS BP-90/60 HR-70s BS-123 pt is alert and oriented x 4. EMS noted a left sided lower flank deformity. Pt states she has a Hx of blacking out.

## 2018-10-24 ENCOUNTER — Encounter: Payer: Self-pay | Admitting: Emergency Medicine

## 2018-10-24 ENCOUNTER — Other Ambulatory Visit: Payer: Self-pay

## 2018-10-24 ENCOUNTER — Emergency Department: Payer: Self-pay

## 2018-10-24 ENCOUNTER — Inpatient Hospital Stay
Admission: EM | Admit: 2018-10-24 | Discharge: 2018-10-29 | DRG: 871 | Disposition: A | Discharge status: Home Health | Payer: Self-pay | Attending: Family Medicine | Admitting: Family Medicine

## 2018-10-24 DIAGNOSIS — K7460 Unspecified cirrhosis of liver: Secondary | ICD-10-CM | POA: Diagnosis present

## 2018-10-24 DIAGNOSIS — R739 Hyperglycemia, unspecified: Secondary | ICD-10-CM | POA: Diagnosis present

## 2018-10-24 DIAGNOSIS — I4891 Unspecified atrial fibrillation: Secondary | ICD-10-CM

## 2018-10-24 DIAGNOSIS — F17210 Nicotine dependence, cigarettes, uncomplicated: Secondary | ICD-10-CM | POA: Diagnosis present

## 2018-10-24 DIAGNOSIS — R6521 Severe sepsis with septic shock: Secondary | ICD-10-CM | POA: Diagnosis present

## 2018-10-24 DIAGNOSIS — Z85828 Personal history of other malignant neoplasm of skin: Secondary | ICD-10-CM

## 2018-10-24 DIAGNOSIS — A4189 Other specified sepsis: Principal | ICD-10-CM | POA: Diagnosis present

## 2018-10-24 DIAGNOSIS — G92 Toxic encephalopathy: Secondary | ICD-10-CM | POA: Diagnosis present

## 2018-10-24 DIAGNOSIS — D638 Anemia in other chronic diseases classified elsewhere: Secondary | ICD-10-CM | POA: Diagnosis present

## 2018-10-24 DIAGNOSIS — E876 Hypokalemia: Secondary | ICD-10-CM | POA: Diagnosis present

## 2018-10-24 DIAGNOSIS — I480 Paroxysmal atrial fibrillation: Secondary | ICD-10-CM | POA: Diagnosis present

## 2018-10-24 DIAGNOSIS — N179 Acute kidney failure, unspecified: Secondary | ICD-10-CM | POA: Diagnosis present

## 2018-10-24 DIAGNOSIS — Z79899 Other long term (current) drug therapy: Secondary | ICD-10-CM

## 2018-10-24 DIAGNOSIS — R140 Abdominal distension (gaseous): Secondary | ICD-10-CM

## 2018-10-24 DIAGNOSIS — F1020 Alcohol dependence, uncomplicated: Secondary | ICD-10-CM | POA: Diagnosis present

## 2018-10-24 DIAGNOSIS — Z9114 Patient's other noncompliance with medication regimen: Secondary | ICD-10-CM

## 2018-10-24 DIAGNOSIS — R531 Weakness: Secondary | ICD-10-CM

## 2018-10-24 DIAGNOSIS — F341 Dysthymic disorder: Secondary | ICD-10-CM

## 2018-10-24 DIAGNOSIS — R188 Other ascites: Secondary | ICD-10-CM | POA: Diagnosis present

## 2018-10-24 DIAGNOSIS — E871 Hypo-osmolality and hyponatremia: Secondary | ICD-10-CM | POA: Diagnosis present

## 2018-10-24 DIAGNOSIS — Z9181 History of falling: Secondary | ICD-10-CM

## 2018-10-24 DIAGNOSIS — F1010 Alcohol abuse, uncomplicated: Secondary | ICD-10-CM | POA: Diagnosis present

## 2018-10-24 DIAGNOSIS — N390 Urinary tract infection, site not specified: Secondary | ICD-10-CM | POA: Diagnosis present

## 2018-10-24 DIAGNOSIS — E872 Acidosis: Secondary | ICD-10-CM | POA: Diagnosis present

## 2018-10-24 DIAGNOSIS — A419 Sepsis, unspecified organism: Secondary | ICD-10-CM | POA: Diagnosis present

## 2018-10-24 LAB — URINE DRUG SCREEN, QUALITATIVE (ARMC ONLY)
Amphetamines, Ur Screen: NOT DETECTED
Barbiturates, Ur Screen: NOT DETECTED
Benzodiazepine, Ur Scrn: NOT DETECTED
Cannabinoid 50 Ng, Ur ~~LOC~~: NOT DETECTED
Cocaine Metabolite,Ur ~~LOC~~: NOT DETECTED
MDMA (ECSTASY) UR SCREEN: NOT DETECTED
METHADONE SCREEN, URINE: NOT DETECTED
OPIATE, UR SCREEN: NOT DETECTED
Phencyclidine (PCP) Ur S: NOT DETECTED
Tricyclic, Ur Screen: NOT DETECTED

## 2018-10-24 LAB — URINALYSIS, COMPLETE (UACMP) WITH MICROSCOPIC
Bilirubin Urine: NEGATIVE
GLUCOSE, UA: NEGATIVE mg/dL
Ketones, ur: NEGATIVE mg/dL
NITRITE: NEGATIVE
Protein, ur: 100 mg/dL — AB
RBC / HPF: >50 RBC/hpf — ABNORMAL HIGH (ref 0–5)
Specific Gravity, Urine: 1.014 (ref 1.005–1.030)
Squamous Epithelial / LPF: >50 — ABNORMAL HIGH (ref 0–5)
WBC, UA: >50 WBC/hpf — ABNORMAL HIGH (ref 0–5)
pH: 5 (ref 5.0–8.0)

## 2018-10-24 LAB — COMPREHENSIVE METABOLIC PANEL
ALT: 18 U/L (ref 0–44)
AST: 53 U/L — ABNORMAL HIGH (ref 15–41)
Albumin: 2.1 g/dL — ABNORMAL LOW (ref 3.5–5.0)
Alkaline Phosphatase: 122 U/L (ref 38–126)
Anion gap: 17 — ABNORMAL HIGH (ref 5–15)
BUN: 29 mg/dL — AB (ref 8–23)
CHLORIDE: 82 mmol/L — AB (ref 98–111)
CO2: 30 mmol/L (ref 22–32)
CREATININE: 1.51 mg/dL — AB (ref 0.44–1.00)
Calcium: 7.6 mg/dL — ABNORMAL LOW (ref 8.9–10.3)
GFR calc Af Amer: 42 mL/min — ABNORMAL LOW (ref >60)
GFR, EST NON AFRICAN AMERICAN: 36 mL/min — AB (ref >60)
Glucose, Bld: 109 mg/dL — ABNORMAL HIGH (ref 70–99)
Potassium: 2.8 mmol/L — ABNORMAL LOW (ref 3.5–5.1)
Sodium: 129 mmol/L — ABNORMAL LOW (ref 135–145)
Total Bilirubin: 2.5 mg/dL — ABNORMAL HIGH (ref 0.3–1.2)
Total Protein: 6.4 g/dL — ABNORMAL LOW (ref 6.5–8.1)

## 2018-10-24 LAB — CBC WITH DIFFERENTIAL/PLATELET
Abs Immature Granulocytes: 0.06 10*3/uL (ref 0.00–0.07)
Basophils Absolute: 0 10*3/uL (ref 0.0–0.1)
Basophils Relative: 0 %
EOS ABS: 0 10*3/uL (ref 0.0–0.5)
EOS PCT: 0 %
HCT: 30.1 % — ABNORMAL LOW (ref 36.0–46.0)
HEMOGLOBIN: 10.6 g/dL — AB (ref 12.0–15.0)
Immature Granulocytes: 1 %
LYMPHS ABS: 1 10*3/uL (ref 0.7–4.0)
Lymphocytes Relative: 11 %
MCH: 34.3 pg — ABNORMAL HIGH (ref 26.0–34.0)
MCHC: 35.2 g/dL (ref 30.0–36.0)
MCV: 97.4 fL (ref 80.0–100.0)
MONO ABS: 0.7 10*3/uL (ref 0.1–1.0)
Monocytes Relative: 8 %
Neutro Abs: 7.5 10*3/uL (ref 1.7–7.7)
Neutrophils Relative %: 80 %
PLATELETS: 271 10*3/uL (ref 150–400)
RBC: 3.09 MIL/uL — ABNORMAL LOW (ref 3.87–5.11)
RDW: 12.5 % (ref 11.5–15.5)
WBC: 9.4 10*3/uL (ref 4.0–10.5)
nRBC: 0 % (ref 0.0–0.2)

## 2018-10-24 LAB — LACTIC ACID, PLASMA
LACTIC ACID, VENOUS: 1.8 mmol/L (ref 0.5–1.9)
Lactic Acid, Venous: 2.6 mmol/L (ref 0.5–1.9)

## 2018-10-24 LAB — TYPE AND SCREEN
ABO/RH(D): B POS
Antibody Screen: NEGATIVE

## 2018-10-24 LAB — TROPONIN I

## 2018-10-24 LAB — LIPASE, BLOOD: Lipase: 28 U/L (ref 11–51)

## 2018-10-24 LAB — ETHANOL: Alcohol, Ethyl (B): <10 mg/dL (ref <10)

## 2018-10-24 LAB — AMMONIA: Ammonia: 48 umol/L — ABNORMAL HIGH (ref 9–35)

## 2018-10-24 MED ORDER — DIGOXIN 0.25 MG/ML IJ SOLN
0.2500 mg | Freq: Once | INTRAMUSCULAR | Status: AC
  Administered 2018-10-25: 0.25 mg via INTRAVENOUS
  Filled 2018-10-24: qty 2

## 2018-10-24 MED ORDER — DILTIAZEM HCL-DEXTROSE 100-5 MG/100ML-% IV SOLN (PREMIX)
5.0000 mg/h | Freq: Once | INTRAVENOUS | Status: AC
  Administered 2018-10-24: 5 mg/h via INTRAVENOUS
  Filled 2018-10-24: qty 100

## 2018-10-24 MED ORDER — SODIUM CHLORIDE 0.9 % IV SOLN
1.0000 g | INTRAVENOUS | Status: DC
  Administered 2018-10-24: 1 g via INTRAVENOUS
  Filled 2018-10-24 (×2): qty 10

## 2018-10-24 MED ORDER — SODIUM CHLORIDE 0.9 % IV BOLUS
1000.0000 mL | Freq: Once | INTRAVENOUS | Status: AC
  Administered 2018-10-24: 1000 mL via INTRAVENOUS

## 2018-10-24 MED ORDER — NOREPINEPHRINE 4 MG/250ML-% IV SOLN
0.0000 ug/min | Freq: Once | INTRAVENOUS | Status: AC
  Administered 2018-10-24: 10 ug/min via INTRAVENOUS
  Filled 2018-10-24: qty 250

## 2018-10-24 MED ORDER — ONDANSETRON HCL 4 MG/2ML IJ SOLN
4.0000 mg | Freq: Once | INTRAMUSCULAR | Status: AC
  Administered 2018-10-24: 4 mg via INTRAVENOUS
  Filled 2018-10-24: qty 2

## 2018-10-24 NOTE — ED Triage Notes (Signed)
Pt presents from an extended stay facility via [facility] with c/o weakness. Pt states she been sick for about a year, and has progressively gotten worse. On oct 9th, pt states she "had an episode that caused her to not be able to get out of bed any mor"e. Pt states she uses the bathroom in her briefs since she is not able to get up. A-fib RVR on the monitor for ems. EMS gave pt 1L bolus normal saline in route. Pt has hx of a-fib. Pt is lethargic but alert and oriented x4. Pt presents with stool in brief but denies incontinence. Pt states she lives at home with husband.

## 2018-10-24 NOTE — ED Notes (Signed)
This RN spoke with Dr. Kerman Passey regarding continued hypotension, orders placed for another 1L bolus.

## 2018-10-24 NOTE — Consult Note (Signed)
CODE SEPSIS - PHARMACY COMMUNICATION  **Broad Spectrum Antibiotics should be administered within 1 hour of Sepsis diagnosis**  Time Code Sepsis Called/Page Received: 1653  Antibiotics Ordered: Ceftriaxone  Time of 1st antibiotic administration: 1717  Additional action taken by pharmacy: N/A  If necessary, Name of Provider/Nurse Contacted: [place] ,PharmD Clinical Pharmacist  10/24/2018  4:56 PM

## 2018-10-24 NOTE — ED Notes (Signed)
Date and time results received: 10/24/18 6:08 PM    Test: Lactic Critical Value: 2.6  Name of Provider Notified: Dr. Kerman Passey  Orders Received? Or Actions Taken?: Orders Received - See Orders for details

## 2018-10-24 NOTE — ED Provider Notes (Addendum)
[facility] Emergency Department Provider Note  Time seen: 4:45 PM  I have reviewed the triage vital signs and the nursing notes.   HISTORY  Chief Complaint No chief complaint on file.    HPI [name] is a 62 y.o. female with a past medical history of alcohol abuse, atrial fibrillation, depression, presents to the emergency department for generalized weakness.  According to EMS report they were called out to the patient's residence which is a extended-stay hotel for generalized weakness.  Patient found to be in bed, had defecated in her diaper, complaining of generalized weakness and fatigue.  Patient does admit to alcohol use this morning.  Patient states she remains in A. fib most of the time takes a blue pill for A. fib but does not know what it is called.  EMS state patient was found to be in A. fib with RVR with a rate around 115 initial blood pressure in the 16W systolic.  IV was started.  Upon arrival patient is awake and alert, she is oriented to person place and time however her speech is minimally slurred, her statements are very slow, very slow to respond but does follow commands, just takes her a prolonged amount of time.  Patient is extremely poor historian, is describing things that happened years ago, unable to provide an adequate recent history besides feeling weak today.  Denies any recent black or bloody stool.  States she has been nauseated with vomiting all morning.  Largely negative review of systems otherwise.   Past Medical History:  Diagnosis Date  . Cancer ([place])    skin  . Depression   . ETOH abuse     Patient Active Problem List   Diagnosis Date Noted  . A-fib ([place]) 11/26/2017  . Malnutrition of moderate degree 09/27/2016  . Acute pancreatitis 09/26/2016    Past Surgical History:  Procedure Laterality Date  . MOLE REMOVAL      Prior to Admission medications   Medication Sig Start Date End Date Taking? Authorizing Provider   aspirin EC 81 MG EC tablet Take 1 tablet (81 mg total) by mouth daily. 11/29/17   [name], MD  diltiazem (CARDIZEM CD) 120 MG 24 hr capsule Take 1 capsule (120 mg total) by mouth daily. 05/22/18   Vaughan Basta, MD  feeding supplement, ENSURE ENLIVE, (ENSURE ENLIVE) LIQD Take 237 mLs by mouth 2 (two) times daily between meals. 05/21/18   Vaughan Basta, MD  folic acid (FOLVITE) 1 MG tablet Take 1 tablet (1 mg total) by mouth daily. 05/22/18   Vaughan Basta, MD  Multiple Vitamin (MULTIVITAMIN WITH MINERALS) TABS tablet Take 1 tablet by mouth daily. 05/22/18   Vaughan Basta, MD  thiamine 100 MG tablet Take 1 tablet (100 mg total) by mouth daily. 05/22/18   Vaughan Basta, MD  traZODone (DESYREL) 100 MG tablet Take 100 mg by mouth at bedtime as needed. for sleep 10/07/17   [provider]  venlafaxine XR (EFFEXOR-XR) 75 MG 24 hr capsule Take 225 mg by mouth daily.     [provider]    No Known Allergies  Family History  Problem Relation Age of Onset  . Breast cancer Neg Hx     Social History Social History   Tobacco Use  . Smoking status: Current Every Day Smoker    Packs/day: 0.25    Types: Cigarettes  . Smokeless tobacco: Never Used  Substance Use Topics  . Alcohol use: Yes    Alcohol/week: 1.0  standard drinks    Types: 1 Shots of liquor per week  . Drug use: No    Review of Systems Constitutional: Negative for fever. Cardiovascular: Negative for chest pain. Respiratory: Negative for shortness of breath. Gastrointestinal: Mild upper abdominal pain from vomiting per patient.  Positive for nausea vomiting.  Negative for diarrhea.  Did defecate in her diaper per EMS. Genitourinary: Negative for urinary compaints Musculoskeletal: Negative for musculoskeletal complaints Skin: Negative for skin complaints  Neurological: Negative for headache All other ROS negative  ____________________________________________   PHYSICAL  EXAM:  Constitutional: Alert and oriented. Well appearing and in no distress. Eyes: Normal exam ENT   Head: Normocephalic and atraumatic.   Mouth/Throat: Mucous membranes are moist. Cardiovascular: Normal rate, regular rhythm. No murmur Respiratory: Normal respiratory effort without tachypnea nor retractions. Breath sounds are clear Gastrointestinal: Soft and nontender. No distention.  Musculoskeletal: Nontender with normal range of motion in all extremities. Neurologic:  Normal speech and language. No gross focal neurologic deficits Skin:  Skin is warm, dry and intact.  Psychiatric: Mood and affect are normal.   ____________________________________________    EKG   ____________________________________________    RADIOLOGY  Chest x-ray negative  ____________________________________________   INITIAL IMPRESSION / ASSESSMENT AND PLAN / ED COURSE  Pertinent labs & imaging results that were available during my care of the patient were reviewed by me and considered in my medical decision making (see chart for details).  Patient presents to the emergency department for generalized weakness, from her hotel where she resides.  Patient does state mild upper abdominal pain from vomiting, states she has been nauseated and vomiting all day but does admit to alcohol use today as well.  Patient had defecated in her diaper.  Patient is somewhat pale in appearance.  Rectal exam shows yellow/light brown stool which is guaiac negative.  Differential is quite broad at this time, remains in A. fib with RVR.  Differential would include A. fib with RVR causing generalized weakness, hypotension, dehydration, intra-abdominal pathology, metabolic abnormality renal insufficiency.  We will check labs, EKG, begin IV hydration while awaiting lab results.  We will treat nausea with Zofran.  Per EMS report there was report of bedbugs at the residence as well.  On urinary catheterization urine is  extremely viscous and brown in color very cloudy consistent with a urinary tract infection.  Given her atrial fibrillation with rapid ventricular response we will initiate sepsis protocols start broad-spectrum antibiotics while awaiting lab work.  Work-up shows an elevated lactic acid, creatinine baseline appears to be around 0.7 currently 1.5 consistent with acute renal insufficiency.  Patient's urinalysis consistent with urinary tract infection receiving IV antibiotics.  Heart rate is decreasing and blood pressure is increasing with continued IV fluids remains in atrial fibrillation which appears to be her baseline.  Ethanol negative, drug screen negative, ammonia slightly elevated.  Patient will be admitted to the hospital service for ongoing treatment.   She continues to have hypotension despite significant fluid resuscitation.  We will start the patient on norepinephrine.  Diltiazem drip was paused given the patient's hypotension.  We will dose 0.25 mg of IV digoxin.  We will titrate patient's blood pressure with norepinephrine.  If the patient can tolerate we will likely restart diltiazem if needed.  Patient will be admitted to the ICU for further treatment.  CRITICAL CARE Performed by: Harvest Dark   Total critical care time: 60 minutes  Critical care time was exclusive of separately billable procedures and treating other patients.  Critical care was necessary to treat or prevent imminent or life-threatening deterioration.  Critical care was time spent personally by me on the following activities: development of treatment plan with patient and/or surrogate as well as nursing, discussions with consultants, evaluation of patient's response to treatment, examination of patient, obtaining history from patient or surrogate, ordering and performing treatments and interventions, ordering and review of laboratory studies, ordering and review of radiographic studies, pulse oximetry and  re-evaluation of patient's condition.   ____________________________________________   FINAL CLINICAL IMPRESSION(S) / ED DIAGNOSES  Weakness Atrial for ablation with rapid ventricular response Sepsis Urinary tract infection   Harvest Dark, MD 10/24/18 [id number]    Harvest Dark, MD 10/24/18 2227

## 2018-10-24 NOTE — ED Notes (Signed)
MD aware of patients BP 78/57 with MAP of 65. Pt also rolled slightly onto her R side. No new orders, pt continues to get fluids at this time.

## 2018-10-24 NOTE — ED Notes (Signed)
Hospitalist to bedside at this time 

## 2018-10-25 ENCOUNTER — Inpatient Hospital Stay: Payer: Self-pay

## 2018-10-25 ENCOUNTER — Inpatient Hospital Stay (HOSPITAL_COMMUNITY)
Admit: 2018-10-25 | Discharge: 2018-10-25 | Disposition: A | Discharge status: Home / Self Care | Payer: Self-pay | Attending: Pulmonary Disease | Admitting: Pulmonary Disease

## 2018-10-25 DIAGNOSIS — F1010 Alcohol abuse, uncomplicated: Secondary | ICD-10-CM | POA: Diagnosis present

## 2018-10-25 DIAGNOSIS — A419 Sepsis, unspecified organism: Secondary | ICD-10-CM

## 2018-10-25 DIAGNOSIS — I4891 Unspecified atrial fibrillation: Secondary | ICD-10-CM

## 2018-10-25 LAB — PHOSPHORUS: PHOSPHORUS: 1.8 mg/dL — AB (ref 2.5–4.6)

## 2018-10-25 LAB — COMPREHENSIVE METABOLIC PANEL
ALBUMIN: 1.9 g/dL — AB (ref 3.5–5.0)
ALT: 17 U/L (ref 0–44)
AST: 46 U/L — AB (ref 15–41)
Alkaline Phosphatase: 110 U/L (ref 38–126)
Anion gap: 14 (ref 5–15)
BUN: 26 mg/dL — AB (ref 8–23)
CHLORIDE: 89 mmol/L — AB (ref 98–111)
CO2: 24 mmol/L (ref 22–32)
Calcium: 6.9 mg/dL — ABNORMAL LOW (ref 8.9–10.3)
Creatinine, Ser: 1.26 mg/dL — ABNORMAL HIGH (ref 0.44–1.00)
GFR calc Af Amer: 52 mL/min — ABNORMAL LOW (ref >60)
GFR calc non Af Amer: 45 mL/min — ABNORMAL LOW (ref >60)
GLUCOSE: 160 mg/dL — AB (ref 70–99)
SODIUM: 127 mmol/L — AB (ref 135–145)
Total Bilirubin: 1.8 mg/dL — ABNORMAL HIGH (ref 0.3–1.2)
Total Protein: 5.9 g/dL — ABNORMAL LOW (ref 6.5–8.1)

## 2018-10-25 LAB — CBC
HCT: 32.9 % — ABNORMAL LOW (ref 36.0–46.0)
HEMOGLOBIN: 11.3 g/dL — AB (ref 12.0–15.0)
MCH: 33.8 pg (ref 26.0–34.0)
MCHC: 34.3 g/dL (ref 30.0–36.0)
MCV: 98.5 fL (ref 80.0–100.0)
NRBC: 0 % (ref 0.0–0.2)
Platelets: 342 10*3/uL (ref 150–400)
RBC: 3.34 MIL/uL — AB (ref 3.87–5.11)
RDW: 12.5 % (ref 11.5–15.5)
WBC: 19.1 10*3/uL — ABNORMAL HIGH (ref 4.0–10.5)

## 2018-10-25 LAB — BASIC METABOLIC PANEL
Anion gap: 13 (ref 5–15)
BUN: 24 mg/dL — AB (ref 8–23)
CHLORIDE: 92 mmol/L — AB (ref 98–111)
CO2: 21 mmol/L — AB (ref 22–32)
CREATININE: 1.05 mg/dL — AB (ref 0.44–1.00)
Calcium: 6.9 mg/dL — ABNORMAL LOW (ref 8.9–10.3)
GFR calc Af Amer: >60 mL/min (ref >60)
GFR calc non Af Amer: 56 mL/min — ABNORMAL LOW (ref >60)
GLUCOSE: 119 mg/dL — AB (ref 70–99)
Potassium: 2.6 mmol/L — CL (ref 3.5–5.1)
Sodium: 126 mmol/L — ABNORMAL LOW (ref 135–145)

## 2018-10-25 LAB — GLUCOSE, CAPILLARY: GLUCOSE-CAPILLARY: 149 mg/dL — AB (ref 70–99)

## 2018-10-25 LAB — ECHOCARDIOGRAM COMPLETE
HEIGHTINCHES: 69 in
WEIGHTICAEL: 2400 [oz_av]

## 2018-10-25 LAB — ALBUMIN: Albumin: 1.9 g/dL — ABNORMAL LOW (ref 3.5–5.0)

## 2018-10-25 LAB — MAGNESIUM: Magnesium: 1.5 mg/dL — ABNORMAL LOW (ref 1.7–2.4)

## 2018-10-25 LAB — POTASSIUM: Potassium: 2.6 mmol/L — CL (ref 3.5–5.1)

## 2018-10-25 LAB — URINE CULTURE: Culture: >=100000 — AB

## 2018-10-25 LAB — PROCALCITONIN: PROCALCITONIN: 0.47 ng/mL

## 2018-10-25 LAB — LACTIC ACID, PLASMA: LACTIC ACID, VENOUS: 1.3 mmol/L (ref 0.5–1.9)

## 2018-10-25 LAB — MRSA PCR SCREENING: MRSA by PCR: NEGATIVE

## 2018-10-25 LAB — APTT: APTT: 36 s (ref 24–36)

## 2018-10-25 MED ORDER — VITAMIN B-1 100 MG PO TABS
100.0000 mg | ORAL_TABLET | Freq: Every day | ORAL | Status: DC
  Administered 2018-10-25 – 2018-10-27 (×3): 100 mg via ORAL
  Filled 2018-10-25 (×4): qty 1

## 2018-10-25 MED ORDER — SODIUM CHLORIDE 0.9 % IV SOLN
0.0000 ug/min | INTRAVENOUS | Status: DC
  Filled 2018-10-25: qty 4

## 2018-10-25 MED ORDER — POTASSIUM CHLORIDE IN NACL 40-0.9 MEQ/L-% IV SOLN
INTRAVENOUS | Status: DC
  Administered 2018-10-25 – 2018-10-26 (×3): 100 mL/h via INTRAVENOUS
  Filled 2018-10-25 (×5): qty 1000

## 2018-10-25 MED ORDER — SODIUM CHLORIDE 0.9% FLUSH: 10.0000 mL | INTRAVENOUS | Status: DC | PRN

## 2018-10-25 MED ORDER — LORAZEPAM 2 MG/ML IJ SOLN: 2.0000 mg | INTRAMUSCULAR | Status: DC | PRN

## 2018-10-25 MED ORDER — SODIUM CHLORIDE 0.9 % IV SOLN
2.0000 g | INTRAVENOUS | Status: DC
  Administered 2018-10-25 – 2018-10-27 (×3): 2 g via INTRAVENOUS
  Filled 2018-10-25: qty 2
  Filled 2018-10-25: qty 20
  Filled 2018-10-25 (×2): qty 2
  Filled 2018-10-25: qty 20

## 2018-10-25 MED ORDER — PHENYLEPHRINE HCL-NACL 10-0.9 MG/250ML-% IV SOLN
0.0000 ug/min | INTRAVENOUS | Status: DC
  Administered 2018-10-25: 80 ug/min via INTRAVENOUS
  Administered 2018-10-25: 20 ug/min via INTRAVENOUS
  Administered 2018-10-25: 70 ug/min via INTRAVENOUS
  Filled 2018-10-25 (×5): qty 250

## 2018-10-25 MED ORDER — HEPARIN BOLUS VIA INFUSION
1700.0000 [IU] | Freq: Once | INTRAVENOUS | Status: AC
  Administered 2018-10-25: 1700 [IU] via INTRAVENOUS
  Filled 2018-10-25: qty 1700

## 2018-10-25 MED ORDER — MAGNESIUM SULFATE 4 GM/100ML IV SOLN
4.0000 g | Freq: Once | INTRAVENOUS | Status: AC
  Administered 2018-10-25: 4 g via INTRAVENOUS
  Filled 2018-10-25: qty 100

## 2018-10-25 MED ORDER — HEPARIN (PORCINE) 25000 UT/250ML-% IV SOLN
950.0000 [IU]/h | INTRAVENOUS | Status: DC
  Administered 2018-10-25: 950 [IU]/h via INTRAVENOUS
  Filled 2018-10-25: qty 250

## 2018-10-25 MED ORDER — POTASSIUM CHLORIDE 10 MEQ/100ML IV SOLN
10.0000 meq | INTRAVENOUS | Status: AC
  Administered 2018-10-25 (×3): 10 meq via INTRAVENOUS
  Filled 2018-10-25 (×4): qty 100

## 2018-10-25 MED ORDER — AMIODARONE HCL IN DEXTROSE 360-4.14 MG/200ML-% IV SOLN
60.0000 mg/h | INTRAVENOUS | Status: AC
  Administered 2018-10-25: 60 mg/h via INTRAVENOUS
  Filled 2018-10-25 (×2): qty 200

## 2018-10-25 MED ORDER — AMIODARONE HCL IN DEXTROSE 360-4.14 MG/200ML-% IV SOLN
30.0000 mg/h | INTRAVENOUS | Status: DC
  Administered 2018-10-25 – 2018-10-26 (×3): 30 mg/h via INTRAVENOUS
  Filled 2018-10-25 (×2): qty 200

## 2018-10-25 MED ORDER — ACETAMINOPHEN 650 MG RE SUPP: 650.0000 mg | Freq: Four times a day (QID) | RECTAL | Status: DC | PRN

## 2018-10-25 MED ORDER — POTASSIUM CHLORIDE 20 MEQ PO PACK: 40.0000 meq | PACK | Freq: Once | ORAL | Status: DC

## 2018-10-25 MED ORDER — POTASSIUM CHLORIDE 20 MEQ/15ML (10%) PO SOLN
20.0000 meq | Freq: Once | ORAL | Status: AC
  Administered 2018-10-25: 20 meq via ORAL
  Filled 2018-10-25: qty 15

## 2018-10-25 MED ORDER — SODIUM CHLORIDE 0.9 % IV SOLN
INTRAVENOUS | Status: DC
  Administered 2018-10-25: 03:00:00 via INTRAVENOUS

## 2018-10-25 MED ORDER — ENOXAPARIN SODIUM 40 MG/0.4ML ~~LOC~~ SOLN
40.0000 mg | SUBCUTANEOUS | Status: DC
  Administered 2018-10-25: 40 mg via SUBCUTANEOUS
  Filled 2018-10-25: qty 0.4

## 2018-10-25 MED ORDER — NOREPINEPHRINE 4 MG/250ML-% IV SOLN
0.0000 ug/min | INTRAVENOUS | Status: DC
  Administered 2018-10-25: 10 ug/min via INTRAVENOUS

## 2018-10-25 MED ORDER — FOLIC ACID 1 MG PO TABS
1.0000 mg | ORAL_TABLET | Freq: Every day | ORAL | Status: DC
  Administered 2018-10-25 – 2018-10-26 (×2): 1 mg via ORAL
  Filled 2018-10-25 (×2): qty 1

## 2018-10-25 MED ORDER — ONDANSETRON HCL 4 MG/2ML IJ SOLN
4.0000 mg | Freq: Four times a day (QID) | INTRAMUSCULAR | Status: DC | PRN
  Filled 2018-10-25: qty 2

## 2018-10-25 MED ORDER — ACETAMINOPHEN 325 MG PO TABS: 650.0000 mg | ORAL_TABLET | Freq: Four times a day (QID) | ORAL | Status: DC | PRN

## 2018-10-25 MED ORDER — POTASSIUM PHOSPHATES 15 MMOLE/5ML IV SOLN
30.0000 mmol | Freq: Once | INTRAVENOUS | Status: AC
  Administered 2018-10-25: 30 mmol via INTRAVENOUS
  Filled 2018-10-25: qty 10

## 2018-10-25 MED ORDER — ADULT MULTIVITAMIN W/MINERALS CH
1.0000 | ORAL_TABLET | Freq: Every day | ORAL | Status: DC
  Administered 2018-10-25 – 2018-10-27 (×3): 1 via ORAL
  Filled 2018-10-25 (×4): qty 1

## 2018-10-25 MED ORDER — SODIUM CHLORIDE 0.9% FLUSH
10.0000 mL | Freq: Two times a day (BID) | INTRAVENOUS | Status: DC
  Administered 2018-10-25: 10 mL
  Administered 2018-10-26: 40 mL
  Administered 2018-10-26: 30 mL
  Administered 2018-10-27: 10 mL
  Administered 2018-10-27 – 2018-10-28 (×3): 30 mL

## 2018-10-25 MED ORDER — DILTIAZEM HCL-DEXTROSE 100-5 MG/100ML-% IV SOLN (PREMIX)
5.0000 mg/h | INTRAVENOUS | Status: DC
  Administered 2018-10-25: 10 mg/h via INTRAVENOUS

## 2018-10-25 MED ORDER — ENOXAPARIN SODIUM 40 MG/0.4ML ~~LOC~~ SOLN: 40.0000 mg | SUBCUTANEOUS | Status: DC

## 2018-10-25 MED ORDER — FAMOTIDINE 20 MG PO TABS
20.0000 mg | ORAL_TABLET | Freq: Two times a day (BID) | ORAL | Status: DC
  Administered 2018-10-25 – 2018-10-29 (×9): 20 mg via ORAL
  Filled 2018-10-25 (×9): qty 1

## 2018-10-25 MED ORDER — MAGNESIUM SULFATE 2 GM/50ML IV SOLN
2.0000 g | Freq: Once | INTRAVENOUS | Status: AC
  Administered 2018-10-25: 2 g via INTRAVENOUS
  Filled 2018-10-25: qty 50

## 2018-10-25 MED ORDER — ONDANSETRON HCL 4 MG PO TABS
4.0000 mg | ORAL_TABLET | Freq: Four times a day (QID) | ORAL | Status: DC | PRN
  Administered 2018-10-28: 4 mg via ORAL
  Filled 2018-10-25: qty 1

## 2018-10-25 NOTE — Consult Note (Signed)
PULMONARY / CRITICAL CARE MEDICINE   Name: [name] MRN: [id number] DOB: Sep 28, 1956    ADMISSION DATE:  10/24/2018 CONSULTATION DATE:  10/25/2018  REFERRING MD:  Dr. Jannifer Franklin  CHIEF COMPLAINT:  Generalized weakness  BRIEF DISCUSSION: 62 y.o. Female admitted with Septic shock secondary to UTI requiring vasopressors and A-fib w/ RVR.  HISTORY OF PRESENT ILLNESS:   [name] is a 62 y.o. Female with a PMH of ETOH abuse, A-fib, depression, and Cancer, who presents to [facility] ED on 10/24/18 with c/o generalized weakness.  EMS was called out to the her residence (which is an extended-stay hotel), of which pt was found in bed with a diaper on which she had defecated on herself.  She reported that she had drunk alcohol earlier in the morning.  She was noted by EMS to be in A-fib with RVR with HR approximately 115, and was noted to be hypotensive with SBP in the 70's.  Upon arrival to the ED she was awake and alert, but speech was minimally slurred and she was very slow to respond to commands (but she was able to follow commands).  Initial workup in the ED revealed Lactic acid 2.6, WBC 9.4, negative troponin, Sodium 129, Potassium 2.8, Creatinine 1.51, Anion gap 17, and ammonia 48.  UA is positive for UTI, and CXR is not concerning for any acute disease process.  Serum Ethyl alcohol level is less than 10, and Urine drug screen is negative.  She is being admitted to [facility] ICU for Septic shock secondary to UTI requiring vasopressors, and A-fib with RVR.  PCCM is consulted for further management.  PAST MEDICAL HISTORY :  She  has a past medical history of Cancer ([place]), Depression, and ETOH abuse.  PAST SURGICAL HISTORY: She  has a past surgical history that includes Mole removal.  No Known Allergies  No current facility-administered medications on file prior to encounter.    Current Outpatient Medications on File Prior to Encounter  Medication Sig  . diltiazem (CARDIZEM CD) 120 MG 24 hr capsule Take  1 capsule (120 mg total) by mouth daily.  . feeding supplement, ENSURE ENLIVE, (ENSURE ENLIVE) LIQD Take 237 mLs by mouth 2 (two) times daily between meals. (Patient not taking: Reported on 10/24/2018)  . folic acid (FOLVITE) 1 MG tablet Take 1 tablet (1 mg total) by mouth daily. (Patient not taking: Reported on 10/24/2018)  . Multiple Vitamin (MULTIVITAMIN WITH MINERALS) TABS tablet Take 1 tablet by mouth daily. (Patient not taking: Reported on 10/24/2018)  . thiamine 100 MG tablet Take 1 tablet (100 mg total) by mouth daily. (Patient not taking: Reported on 10/24/2018)    FAMILY HISTORY:  Her She indicated that the status of her neg hx is unknown.   SOCIAL HISTORY: She  reports that she has been smoking cigarettes. She has been smoking about 0.25 packs per day. She has never used smokeless tobacco. She reports that she drinks about 1.0 standard drinks of alcohol per week. She reports that she does not use drugs.  REVIEW OF SYSTEMS:   Positives in BOLD: Gen: Denies fever, chills, weight change, +fatigue, night sweats HEENT: Denies blurred vision, double vision, hearing loss, tinnitus, sinus congestion, rhinorrhea, sore throat, neck stiffness, dysphagia PULM: Denies shortness of breath, cough, sputum production, hemoptysis, wheezing CV: Denies chest pain, edema, orthopnea, paroxysmal nocturnal dyspnea, palpitations GI: Denies abdominal pain, nausea, vomiting, diarrhea, hematochezia, melena, constipation, change in bowel habits GU: Denies dysuria, hematuria, polyuria, oliguria, urethral discharge Endocrine: Denies hot or cold  intolerance, polyuria, polyphagia or appetite change Derm: Denies rash, dry skin, scaling or peeling skin change Heme: Denies easy bruising, bleeding, bleeding gums Neuro: Denies headache, numbness, weakness, slurred speech, loss of memory or consciousness   SUBJECTIVE:  Pt reports she is tired and wants to go to sleep Denies chest pain, cough, shortness of breath,  edema Denies fever, chills On room air Levophed infusing at 10 mcg  VITAL SIGNS: BP 92/60   Pulse (!) 119   Temp (!) 97.5 F (36.4 C) (Oral)   Resp 18   Ht 5\' 9"  (1.753 m)   Wt 68 kg   SpO2 96%   BMI 22.15 kg/m   HEMODYNAMICS:    VENTILATOR SETTINGS:    INTAKE / OUTPUT: I/O last 3 completed shifts: In: 2099.9 [IV Piggyback:2099.9] Out: -   PHYSICAL EXAMINATION: General:  Acutely ill appearing female, laying in bed, on room air, in NAD Neuro:  Awake, A&O, follows commands, no focal deficits HEENT:  Atraumatic, normocephalic, neck supple, no JVD Cardiovascular:  Tachycardia, irregularly irregular rhythm, no M/R/G, 2+ radial pulses bilaterally, 1+ pedal pulses bilaterally Lungs:  Clear to auscultation bilaterally, no wheezing, even, nonlabored, normal effort Abdomen:  Soft, nontender, nondistended, BS+ x4 Musculoskeletal:  No deformities, normal bulk and tone, no edema Skin:  Warm/dry.  No obvious rashes, lesions, or ulcerations  LABS:  BMET Recent Labs  Lab 10/24/18 1700  NA 129*  K 2.8*  CL 82*  CO2 30  BUN 29*  CREATININE 1.51*  GLUCOSE 109*    Electrolytes Recent Labs  Lab 10/24/18 1700  CALCIUM 7.6*    CBC Recent Labs  Lab 10/24/18 1700  WBC 9.4  HGB 10.6*  HCT 30.1*  PLT 271    Coag's No results for input(s): APTT, INR in the last 168 hours.  Sepsis Markers Recent Labs  Lab 10/24/18 1700 10/24/18 1900  LATICACIDVEN 2.6* 1.8    ABG No results for input(s): PHART, PCO2ART, PO2ART in the last 168 hours.  Liver Enzymes Recent Labs  Lab 10/24/18 1700  AST 53*  ALT 18  ALKPHOS 122  BILITOT 2.5*  ALBUMIN 2.1*    Cardiac Enzymes Recent Labs  Lab 10/24/18 1700  TROPONINI <0.03    Glucose No results for input(s): GLUCAP in the last 168 hours.  Imaging Dg Chest Portable 1 View  Result Date: 10/24/2018 CLINICAL DATA:  Weakness EXAM: PORTABLE CHEST 1 VIEW COMPARISON:  05/19/2018 FINDINGS: The heart size and mediastinal  contours are within normal limits. Both lungs are clear. The visualized skeletal structures are unremarkable. IMPRESSION: No active disease. Electronically Signed   By: Donavan Foil M.D.   On: 10/24/2018 17:23    STUDIES:   CULTURES: Blood x2 10/24/18>> Urine 10/24/18>>  ANTIBIOTICS: Ceftriaxone 10/24/18>>  SIGNIFICANT EVENTS: 10/25/18>> Admission to [facility] ICU  LINES/TUBES:   ASSESSMENT / PLAN:  PULMONARY A: No acute issues -CXR negative for any acute process P:   -Supplemental O2 as needed to maintain O2 sats >92% -Follow intermittent CXR and ABG  CARDIOVASCULAR A:  Septic shock A-fib w/ RVR P:  -Cardiac monitoring -Maintain MAP >65 -Received 3L NS bolus in ED 11/5 -NS @ 100 ml/hr -Levophed as needed to maintain MAP goal; Discussed with Dr. Emmit Alexanders of [facility], will switch to Neo-Synephrine given A-fib w/ RVR + no central venous access  -Cardizem drip, Discussed with Dr. Emmit Alexanders of [facility], will switch to Amiodarone drip given Hypotension requiring vasopressors -Cardiology consulted, appreciate input  RENAL A:   AKI Hyponatremia Hypokalemia Anion  gap metabolic acidosis in setting of Lactic Acidosis P:   -Monitor I&O's / urinary output -Follow BMP -Ensure adequate renal perfusion -Avoid nephrotoxic agents as able -Replace electrolytes as indicated -Obtain renal ultrasound -NS @ 100 ml/hr -Trend Lactic Acid -Currently receiving 40 mEq K IV replacement, follow up serum K after replacement completed -Pt receiving Magnesium replacement  GASTROINTESTINAL A:   Hyperammonemia  -Ammonia 48 Mildly elevated AST P:   -NPO for now -Trend ammonia -Trend LFT's  HEMATOLOGIC A:   Anemia without signs of active bleeding P:  -Monitor for S/Sx of bleeding -Trend CBC -Lovenox SQ for VTE Prophylaxis  -Transfuse for Hgb <7  INFECTIOUS A:   Sepsis secondary to UTI P:   -Monitor fever curve -Trend WBC's and Procalcitonin -Follow cultures as above -Continue  Rocephin  ENDOCRINE A:   Mild Hyperglycemia   P:   -CBG's -Follow ICU Hyper/hypoglycemia protocol  NEUROLOGIC A:   Acute metabolic encephalopathy -Serum Ethyl alcohol <10 -Urine drug screen negative Hx: ETOH abuse, Depression P:   -Provide supportive care -Avoid sedating meds as able -Lights on during the day -Promote normal wake/sleep cycle -CIWA protocol     FAMILY  - Updates: Updated pt at bedside 10/25/18.  Pt is a Full Code.  - Inter-disciplinary family meet or Palliative Care meeting due by:  11/01/18    Darel Hong, AGACNP-BC [facility] Pager: [phone] Cell: [phone]  10/25/2018, 12:51 AM

## 2018-10-25 NOTE — Consult Note (Signed)
[facility] Consultation Note  Patient ID: [name], MRN: [id number], DOB/AGE: [birth year] 63 y.o. Admit date: 10/24/2018   Date of Consult: 10/25/2018 Primary Physician: [place] Primary Cardiologist: None  Chief Complaint:  Chief Complaint  Patient presents with  . Weakness   Reason for Consult: Atrial fibrillation  HPI: 62 y.o. female with known apparent alcohol use who has had significant issues in the past of a atrial fibrillation paroxysmal in nature.  Patient has been on diltiazem but no anticoagulation.  The patient has not had any other cardiovascular symptoms or issues.  She was having some neck significant weakness and fatigue recently and appears to have had some urinary tract infection and other issues.  When seen in the emergency room she additionally had atrial fibrillation with rapid ventricular rate most consistent with not being able to take her medications.  Since then she has been placed on amiodarone drip with reasonable heart rate control at this time and no symptoms of myocardial infarction or congestive heart failure.  Currently her heart rate is relatively well controlled.  She is been started on antibiotics for urinary tract infection as in the improved  Past Medical History:  Diagnosis Date  . Cancer ([place])    skin  . Depression   . ETOH abuse       Surgical History:  Past Surgical History:  Procedure Laterality Date  . MOLE REMOVAL       Home Meds: Prior to Admission medications   Medication Sig Start Date End Date Taking? Authorizing Provider  diltiazem (CARDIZEM CD) 120 MG 24 hr capsule Take 1 capsule (120 mg total) by mouth daily. 05/22/18  Yes Vaughan Basta, MD  feeding supplement, ENSURE ENLIVE, (ENSURE ENLIVE) LIQD Take 237 mLs by mouth 2 (two) times daily between meals. Patient not taking: Reported on 10/24/2018 05/21/18   Vaughan Basta, MD  folic acid (FOLVITE) 1 MG tablet Take 1 tablet (1 mg  total) by mouth daily. Patient not taking: Reported on 10/24/2018 05/22/18   Vaughan Basta, MD  Multiple Vitamin (MULTIVITAMIN WITH MINERALS) TABS tablet Take 1 tablet by mouth daily. Patient not taking: Reported on 10/24/2018 05/22/18   Vaughan Basta, MD  thiamine 100 MG tablet Take 1 tablet (100 mg total) by mouth daily. Patient not taking: Reported on 10/24/2018 05/22/18   Vaughan Basta, MD    Inpatient Medications:  . enoxaparin (LOVENOX) injection  40 mg Subcutaneous Q24H   . sodium chloride 100 mL/hr at 10/25/18 0518  . amiodarone 60 mg/hr (10/25/18 0518)   Followed by  . amiodarone    . cefTRIAXone (ROCEPHIN)  IV Stopped (10/24/18 1748)  . phenylephrine (NEO-SYNEPHRINE) Adult infusion 70 mcg/min (10/25/18 0726)    Allergies: No Known Allergies  Social History   Socioeconomic History  . Marital status: Married    Spouse name: Not on file  . Number of children: Not on file  . Years of education: Not on file  . Highest education level: Not on file  Occupational History  . Not on file  Social Needs  . Financial resource strain: Not on file  . Food insecurity:    Worry: Not on file    Inability: Not on file  . Transportation needs:    Medical: Not on file    Non-medical: Not on file  Tobacco Use  . Smoking status: Current Every Day Smoker    Packs/day: 0.25    Types: Cigarettes  . Smokeless tobacco: Never Used  Substance and  Sexual Activity  . Alcohol use: Yes    Alcohol/week: 1.0 standard drinks    Types: 1 Shots of liquor per week  . Drug use: No  . Sexual activity: Never  Lifestyle  . Physical activity:    Days per week: Not on file    Minutes per session: Not on file  . Stress: Not on file  Relationships  . Social connections:    Talks on phone: Not on file    Gets together: Not on file    Attends religious service: Not on file    Active member of club or organization: Not on file    Attends meetings of clubs or organizations: Not  on file    Relationship status: Not on file  . Intimate partner violence:    Fear of current or ex partner: Not on file    Emotionally abused: Not on file    Physically abused: Not on file    Forced sexual activity: Not on file  Other Topics Concern  . Not on file  Social History Narrative  . Not on file     Family History  Problem Relation Age of Onset  . Breast cancer Neg Hx      Review of Systems Positive for palpitations weakness Negative for: General:  chills, fever, night sweats or weight changes.  Cardiovascular: PND orthopnea syncope dizziness  Dermatological skin lesions rashes Respiratory: Cough congestion Urologic: Frequent urination urination at night and hematuria Abdominal: negative for nausea, vomiting, diarrhea, bright red blood per rectum, melena, or hematemesis Neurologic: negative for visual changes, and/or hearing changes  All other systems reviewed and are otherwise negative except as noted above.  Labs: Recent Labs    10/24/18 1700  TROPONINI <0.03   Lab Results  Component Value Date   WBC 19.1 (H) 10/25/2018   HGB 11.3 (L) 10/25/2018   HCT 32.9 (L) 10/25/2018   MCV 98.5 10/25/2018   PLT 342 10/25/2018    Recent Labs  Lab 10/25/18 0519  NA 127*  K <2.0*  CL 89*  CO2 24  BUN 26*  CREATININE 1.26*  CALCIUM 6.9*  PROT 5.9*  BILITOT 1.8*  ALKPHOS 110  ALT 17  AST 46*  GLUCOSE 160*   No results found for: CHOL, HDL, LDLCALC, TRIG No results found for: DDIMER  Radiology/Studies:  Dg Chest Portable 1 View  Result Date: 10/24/2018 CLINICAL DATA:  Weakness EXAM: PORTABLE CHEST 1 VIEW COMPARISON:  05/19/2018 FINDINGS: The heart size and mediastinal contours are within normal limits. Both lungs are clear. The visualized skeletal structures are unremarkable. IMPRESSION: No active disease. Electronically Signed   By: Donavan Foil M.D.   On: 10/24/2018 17:23   [place] Ekg Site Rite  Result Date: 10/25/2018 If Site Rite image not attached,  placement could not be confirmed due to current cardiac rhythm.   EKG: Atrial fibrillation rapid ventricular rate  Weights: Filed Weights   10/24/18 1656  Weight: 68 kg     Physical Exam: Blood pressure 90/74, pulse 87, temperature 98.9 F (37.2 C), temperature source Oral, resp. rate 19, height 5\' 9"  (1.753 m), weight 68 kg, SpO2 92 %. Body mass index is 22.15 kg/m. General: Well developed, well nourished, in no acute distress. Head eyes ears nose throat: Normocephalic, atraumatic, sclera non-icteric, no xanthomas, nares are without discharge. No apparent thyromegaly and/or mass  Lungs: Normal respiratory effort.  no wheezes, no rales, no rhonchi.  Heart: Irregular with normal S1 S2. no murmur gallop, no rub,  PMI is normal size and placement, carotid upstroke normal without bruit, jugular venous pressure is normal Abdomen: Soft, non-tender, non-distended with normoactive bowel sounds. No hepatomegaly. No rebound/guarding. No obvious abdominal masses. Abdominal aorta is normal size without bruit Extremities: No edema. no cyanosis, no clubbing, no ulcers  Peripheral : 2+ bilateral upper extremity pulses, 2+ bilateral femoral pulses, 2+ bilateral dorsal pedal pulse Neuro: Alert and oriented. No facial asymmetry. No focal deficit. Moves all extremities spontaneously. Musculoskeletal: Normal muscle tone without kyphosis Psych:  Responds to questions appropriately with a normal affect.    Assessment: 62 year old female with atrial fibrillation rapid ventricular rate without evidence of myocardial infarction and or congestive heart failure  Plan: 1.  Continue amiodarone drip for further treatment of atrial fibrillation with rapid ventricular rate and possible spontaneous conversion to normal rhythm 2.  Anticoagulation temporarily with heparin if able to reduce risk of stroke while converting to normal sinus rhythm 3.  Continue supportive care of treatment of urinary tract infection 4.   No further cardiac diagnostics necessary at this time  Signed, Corey Skains M.D. [facility] 10/25/2018, 8:13 AM

## 2018-10-25 NOTE — Progress Notes (Signed)
[facility] Progress Note Patient Name: [name] DOB: 02/21/1956 MRN: [id number]   Date of Service  10/25/2018  HPI/Events of Note  62 y.o. Female admitted with Septic shock secondary to UTI requiring vasopressors and A-fib w/ RVR. PCCM asked to assume care. VSS.  eICU Interventions  No new orders.     Intervention Category Evaluation Type: New Patient Evaluation  Lysle Dingwall 10/25/2018, 2:59 AM

## 2018-10-25 NOTE — H&P (Addendum)
[facility] [place] NAME: [name]    MR#:  [id number]  DATE OF BIRTH:  1956/11/02  DATE OF ADMISSION:  10/24/2018  PRIMARY CARE PHYSICIAN: [place]   REQUESTING/REFERRING PHYSICIAN: Harvest Dark, MD  CHIEF COMPLAINT:   Chief Complaint  Patient presents with  . Weakness    HISTORY OF PRESENT ILLNESS:  [name]  is a 62 y.o. female who presents with chief complaint as above.  Patient describes "not feeling well for years", and since 09/27/18 being unable to get out of bed due to bilateral leg weakness and dizziness with position change.  Patient reports a history of falls that thinks "are due to my A-fib".  Patient states she can "sometimes" feel her heart racing but has been taking her medication daily.  Patient reports some episodes of nausea with vomiting today at home, denies seeing any blood in emesis.  Patient states that she has been incontinent "for a while" and wears briefs at home.  Patient denies fever/chills/dsyuria.  Patient stated she has not noticed symptoms when having past urinary tract infections.  Patient's urinalysis positive with an elevated lactic of 2.6, hypotension and A-fib RVR and worked up as a Code Sepsis by Emergency Room staff.  Patient remains hypotensive post 3 L NS bolus, requiring vasopressors and A-fib RVR requiring diltiazem gtt for rate control.  Hospitalist called for admission.  PAST MEDICAL HISTORY:   Past Medical History:  Diagnosis Date  . Cancer ([place])    skin  . Depression   . ETOH abuse     PAST SURGICAL HISTORY:   Past Surgical History:  Procedure Laterality Date  . MOLE REMOVAL      SOCIAL HISTORY:   Social History   Tobacco Use  . Smoking status: Current Every Day Smoker    Packs/day: 0.25    Types: Cigarettes  . Smokeless tobacco: Never Used  Substance Use Topics  . Alcohol use: Yes    Alcohol/week: 1.0 standard drinks    Types: 1  Shots of liquor per week    FAMILY HISTORY:   Family History  Problem Relation Age of Onset  . Breast cancer Neg Hx     DRUG ALLERGIES:  No Known Allergies  MEDICATIONS AT HOME:   Prior to Admission medications   Medication Sig Start Date End Date Taking? Authorizing Provider  diltiazem (CARDIZEM CD) 120 MG 24 hr capsule Take 1 capsule (120 mg total) by mouth daily. 05/22/18  Yes Vaughan Basta, MD  feeding supplement, ENSURE ENLIVE, (ENSURE ENLIVE) LIQD Take 237 mLs by mouth 2 (two) times daily between meals. Patient not taking: Reported on 10/24/2018 05/21/18   Vaughan Basta, MD  folic acid (FOLVITE) 1 MG tablet Take 1 tablet (1 mg total) by mouth daily. Patient not taking: Reported on 10/24/2018 05/22/18   Vaughan Basta, MD  Multiple Vitamin (MULTIVITAMIN WITH MINERALS) TABS tablet Take 1 tablet by mouth daily. Patient not taking: Reported on 10/24/2018 05/22/18   Vaughan Basta, MD  thiamine 100 MG tablet Take 1 tablet (100 mg total) by mouth daily. Patient not taking: Reported on 10/24/2018 05/22/18   Vaughan Basta, MD    REVIEW OF SYSTEMS:  Review of Systems  Constitutional: Positive for malaise/fatigue. Negative for chills, fever and weight loss.  HENT: Negative for ear pain, hearing loss and tinnitus.   Eyes: Negative for blurred vision, double vision, pain and redness.  Respiratory: Negative for cough, hemoptysis, shortness of breath  and wheezing.   Cardiovascular: Negative for chest pain, palpitations, orthopnea and leg swelling.  Gastrointestinal: Positive for nausea and vomiting. Negative for abdominal pain, constipation and diarrhea.  Genitourinary: Negative for dysuria, frequency and hematuria.  Musculoskeletal: Negative for back pain, joint pain and neck pain.  Skin:       No acne, rash, or lesions  Neurological: Positive for weakness. Negative for dizziness, tremors and focal weakness.  Endo/Heme/Allergies: Negative for polydipsia.  Does not bruise/bleed easily.  Psychiatric/Behavioral: Negative for depression. The patient is not nervous/anxious and does not have insomnia.      VITAL SIGNS:   Vitals:   10/24/18 2300 10/24/18 2315 10/24/18 2345 10/25/18 0000  BP: (!) 86/61 (!) 115/98 (!) 88/50 (!) 79/58  Pulse: (!) 25  (!) 143 (!) 36  Resp: 17 16 20 17   Temp:      TempSrc:      SpO2: 96%  95% 96%  Weight:      Height:       Wt Readings from Last 3 Encounters:  10/24/18 68 kg  08/26/18 65.8 kg  05/20/18 65 kg    PHYSICAL EXAMINATION:  Physical Exam  Vitals reviewed. Constitutional: She is oriented to person, place, and time. She appears well-developed. No distress.  HENT:  Head: Normocephalic and atraumatic.  Mouth/Throat: Oropharynx is clear and moist.  Eyes: Pupils are equal, round, and reactive to light. Conjunctivae and EOM are normal. No scleral icterus.  Neck: Normal range of motion. Neck supple. No JVD present. No thyromegaly present.  Cardiovascular: Intact distal pulses. An irregular rhythm present. Exam reveals no gallop and no friction rub.  No murmur heard. Afib RVR  Respiratory: Effort normal and breath sounds normal. No respiratory distress. She has no wheezes. She has no rales.  GI: Soft. Bowel sounds are normal. She exhibits no distension. There is no tenderness.  Genitourinary:  Genitourinary Comments: deferred  Musculoskeletal: Normal range of motion. She exhibits no edema.  No arthritis, no gout  Lymphadenopathy:    She has no cervical adenopathy.  Neurological: She is alert and oriented to person, place, and time. No cranial nerve deficit.  No dysarthria, no aphasia  Skin: Skin is warm and dry. No rash noted. No erythema.  Psychiatric: She has a normal mood and affect. Her behavior is normal. Judgment and thought content normal.    LABORATORY PANEL:   CBC Recent Labs  Lab 10/24/18 1700  WBC 9.4  HGB 10.6*  HCT 30.1*  PLT 271    ------------------------------------------------------------------------------------------------------------------  Chemistries  Recent Labs  Lab 10/24/18 1700  NA 129*  K 2.8*  CL 82*  CO2 30  GLUCOSE 109*  BUN 29*  CREATININE 1.51*  CALCIUM 7.6*  AST 53*  ALT 18  ALKPHOS 122  BILITOT 2.5*   ------------------------------------------------------------------------------------------------------------------  Cardiac Enzymes Recent Labs  Lab 10/24/18 1700  TROPONINI <0.03   ------------------------------------------------------------------------------------------------------------------  RADIOLOGY:  Dg Chest Portable 1 View  Result Date: 10/24/2018 CLINICAL DATA:  Weakness EXAM: PORTABLE CHEST 1 VIEW COMPARISON:  05/19/2018 FINDINGS: The heart size and mediastinal contours are within normal limits. Both lungs are clear. The visualized skeletal structures are unremarkable. IMPRESSION: No active disease. Electronically Signed   By: Donavan Foil M.D.   On: 10/24/2018 17:23    EKG:   Orders placed or performed during the hospital encounter of 10/24/18  . ED EKG  . ED EKG  . EKG 12-Lead  . EKG 12-Lead  . EKG 12-Lead  . EKG 12-Lead  IMPRESSION AND PLAN:  Principal Problem:   Sepsis Shock(HCC):  Patient received fluid resuscitation, with corresponding drop in Lactic, and initial dose of Ceftriaxone in ED.  Patient continued on Ceftriaxone with pharmacy consult, with urine culture pending and IVF maintainence.  Patient requiring vasopressors and admitted to the ICU with the CCM team consulted.  Active Problems:   UTI - abx and treatment as above, culture sent   A-fib [facility]):  Patient in A-fib RVR requiring rate control, Diltiazem drip running, Cardiology consult placed to review patient's home medications.      ETOH abuse:  Patient states last drink was this morning, but would not confirm how much alcohol she drinks regularly.  CIWA scale ordered for  monitoring.   Chart review performed and case discussed with ED provider. Labs, imaging and/or ECG reviewed by provider and discussed with patient/family. Management plans discussed with the patient and/or family.  DVT PROPHYLAXIS: SubQ lovenox   GI PROPHYLAXIS:  None  ADMISSION STATUS: Inpatient    CODE STATUS: FULL Code Status History    Date Active Date Inactive Code Status Order ID Comments User Context   05/19/2018 1958 05/21/2018 1712 Full Code [id number]  Fritzi Mandes, MD Inpatient   11/27/2017 0109 11/29/2017 1844 Full Code [id number]  Gorden Harms, MD Inpatient   09/26/2016 1313 09/27/2016 1802 Full Code [id number]  Dustin Flock, MD ED      TOTAL CRITICAL CARE TIME TAKING CARE OF THIS PATIENT: 50 minutes.   [name] [place] 10/25/2018, 12:38 AM  [name]  Office  [phone]  CC: Primary care physician; [place]  Note:  This document was prepared using Systems analyst and may include unintentional dictation errors.

## 2018-10-25 NOTE — Clinical Social Work Note (Signed)
CSW Received consult that patient may need SNF placement, awaiting PT recommendations.  Jones Broom. Norval Morton, MSW, [place]  10/25/2018 4:41 PM

## 2018-10-25 NOTE — Consult Note (Signed)
ANTICOAGULATION CONSULT NOTE - Initial Consult  Pharmacy Consult for Heparin Indication: atrial fibrillation  No Known Allergies  Patient Measurements: Height: 5\' 9"  (175.3 cm) Weight: 150 lb (68 kg) IBW/kg (Calculated) : 66.2 Heparin Dosing Weight: 68 kg  Vital Signs: BP: 90/74 (11/06 0600) Pulse Rate: 87 (11/06 0600)  Labs: Recent Labs    10/24/18 1700 10/25/18 0519 10/25/18 1041  HGB 10.6* 11.3*  --   HCT 30.1* 32.9*  --   PLT 271 342  --   CREATININE 1.51* 1.26* 1.05*  TROPONINI <0.03  --   --     Estimated Creatinine Clearance: 58.1 mL/min (A) (by C-G formula based on SCr of 1.05 mg/dL (H)).   Medical History: Past Medical History:  Diagnosis Date  . Cancer ([place])    skin  . Depression   . ETOH abuse     Medications:  Scheduled:  . [START ON 10/26/2018] enoxaparin (LOVENOX) injection  40 mg Subcutaneous Q24H  . famotidine  20 mg Oral BID  . folic acid  1 mg Oral Daily  . multivitamin with minerals  1 tablet Oral Daily  . sodium chloride flush  10-40 mL Intracatheter Q12H  . thiamine  100 mg Oral Daily    Assessment: Patient not on any PTA anticoagulants. Patient received Lovenox 40 mg SQ @ 8768 this AM. CHADSVASC ~1, PMH only consistent with afib, alcohol use, and skin cancer.  Goal of Therapy:  Heparin level 0.3-0.7 units/ml Monitor platelets by anticoagulation protocol: Yes   Plan:  Ordered heparin bolus of 1700 units, followed by 950 units/hr. Will order aPTT for 6 hours after initiation ~0000. Will order HL with AM labs.  Paticia Stack, PharmD Pharmacy Resident  10/25/2018 4:24 PM

## 2018-10-25 NOTE — Consult Note (Addendum)
[facility] NOTE  Pharmacy Consult for Amiodarone DDI Indication: atrial fibrillation   No Known Allergies   Medications:  Scheduled:  . [START ON 10/26/2018] enoxaparin (LOVENOX) injection  40 mg Subcutaneous Q24H  . famotidine  20 mg Oral BID  . folic acid  1 mg Oral Daily  . multivitamin with minerals  1 tablet Oral Daily  . thiamine  100 mg Oral Daily    Assessment: Level D interactions with:  Digoxin: Amiodarone may increase the effects of digoxin. Patient received one IV dose.  Ondansetron: Additional potential for QTc prolongation. EKG on 11/5 QTc 467. Received one dose IV 11/5, also prn orders for Ondansetron. Will monitor.  Pharmacy will continue to monitor.  Paticia Stack, PharmD Pharmacy Resident  10/25/2018 12:25 PM

## 2018-10-25 NOTE — Care Management Note (Signed)
Case Management Note  Patient Details  Name: [name] MRN: [id number] Date of Birth: 1956/03/13  Subjective/Objective:       Patient has a past medical history of alcohol abuse, atrial fibrillation, depression, presents to the emergency department for generalized weakness.  Patient admitted for Afib with RVR and currently in the ICU.  RNCM in to see patient and introduce self.  Patient states that she has not had a break since 3 am and requests that RNCM comes back at a later time.  Instructed patient that RNCM will return later today or early tomorrow morning, patient states that will be fine.    Doran Clay RN, BSN [phone]               Action/Plan:   Expected Discharge Date:                  Expected Discharge Plan:     In-House Referral:     Discharge planning Services  CM Consult  Post Acute Care Choice:    Choice offered to:     DME Arranged:    DME Agency:     HH Arranged:    HH Agency:     Status of Service:  In process, will continue to follow  If discussed at Long Length of Stay Meetings, dates discussed:    Additional Comments:  Shelbie Hutching, RN 10/25/2018, 11:44 AM

## 2018-10-25 NOTE — Progress Notes (Signed)
Peripherally Inserted Central Catheter/Midline Placement  The IV Nurse has discussed with the patient and/or persons authorized to consent for the patient, the purpose of this procedure and the potential benefits and risks involved with this procedure.  The benefits include less needle sticks, lab draws from the catheter, and the patient may be discharged home with the catheter. Risks include, but not limited to, infection, bleeding, blood clot (thrombus formation), and puncture of an artery; nerve damage and irregular heartbeat and possibility to perform a PICC exchange if needed/ordered by physician.  Alternatives to this procedure were also discussed.  Bard Power PICC patient education guide, fact sheet on infection prevention and patient information card has been provided to patient /or left at bedside.    PICC/Midline Placement Documentation        Darlyn Read 10/25/2018, 12:34 PM

## 2018-10-25 NOTE — Progress Notes (Signed)
*  PRELIMINARY RESULTS* Echocardiogram 2D Echocardiogram has been performed.  Sherrie Sport 10/25/2018, 10:55 AM

## 2018-10-25 NOTE — Consult Note (Addendum)
Pharmacy Electrolyte Monitoring Consult:  Pharmacy consulted to assist in monitoring and replacing electrolytes in this 62 y.o. female admitted on 10/24/2018 with Weakness   Labs:  Sodium (mmol/L)  Date Value  10/25/2018 126 (L)   Potassium (mmol/L)  Date Value  10/25/2018 2.6 (LL)  10/25/2018 2.6 (LL)   Magnesium (mg/dL)  Date Value  10/25/2018 1.5 (L)   Phosphorus (mg/dL)  Date Value  10/25/2018 1.8 (L)   Calcium (mg/dL)  Date Value  10/25/2018 6.9 (L)   Albumin (g/dL)  Date Value  10/25/2018 1.9 (L)    Assessment/Plan: Goals: Potassium ~4.0, magnesium ~2.0  Patient currently receiving NS with KCl 40 mEq @ 100 mL/hr. Also ordered is KCl 20 mEq PO x 1 and Potassium Phosphate 30 mmol x 1. Patient received Mg sulfate 2 g IV, and Mg sulfate 4 g IV x 1 has also been ordered.  Will recheck electrolytes at 2000. Electrolytes have been ordered with AM labs.  Pharmacy will continue to monitor.  Paticia Stack, PharmD Pharmacy Resident  10/25/2018 11:58 AM

## 2018-10-26 ENCOUNTER — Inpatient Hospital Stay: Payer: Self-pay

## 2018-10-26 DIAGNOSIS — F341 Dysthymic disorder: Secondary | ICD-10-CM

## 2018-10-26 LAB — BASIC METABOLIC PANEL
ANION GAP: 10 (ref 5–15)
Anion gap: 8 (ref 5–15)
BUN: 18 mg/dL (ref 8–23)
BUN: 21 mg/dL (ref 8–23)
CALCIUM: 6.8 mg/dL — AB (ref 8.9–10.3)
CHLORIDE: 94 mmol/L — AB (ref 98–111)
CO2: 23 mmol/L (ref 22–32)
CO2: 23 mmol/L (ref 22–32)
Calcium: 6.7 mg/dL — ABNORMAL LOW (ref 8.9–10.3)
Chloride: 97 mmol/L — ABNORMAL LOW (ref 98–111)
Creatinine, Ser: 0.88 mg/dL (ref 0.44–1.00)
Creatinine, Ser: 0.96 mg/dL (ref 0.44–1.00)
GFR calc Af Amer: >60 mL/min (ref >60)
GLUCOSE: 103 mg/dL — AB (ref 70–99)
Glucose, Bld: 121 mg/dL — ABNORMAL HIGH (ref 70–99)
POTASSIUM: 3 mmol/L — AB (ref 3.5–5.1)
Potassium: 4.1 mmol/L (ref 3.5–5.1)
SODIUM: 127 mmol/L — AB (ref 135–145)
Sodium: 128 mmol/L — ABNORMAL LOW (ref 135–145)

## 2018-10-26 LAB — CBC
HCT: 30.5 % — ABNORMAL LOW (ref 36.0–46.0)
Hemoglobin: 10.6 g/dL — ABNORMAL LOW (ref 12.0–15.0)
MCH: 34 pg (ref 26.0–34.0)
MCHC: 34.8 g/dL (ref 30.0–36.0)
MCV: 97.8 fL (ref 80.0–100.0)
PLATELETS: 246 10*3/uL (ref 150–400)
RBC: 3.12 MIL/uL — ABNORMAL LOW (ref 3.87–5.11)
RDW: 12.8 % (ref 11.5–15.5)
WBC: 9.7 10*3/uL (ref 4.0–10.5)
nRBC: 0 % (ref 0.0–0.2)

## 2018-10-26 LAB — BODY FLUID CELL COUNT WITH DIFFERENTIAL
EOS FL: 0 %
Lymphs, Fluid: 34 %
MONOCYTE-MACROPHAGE-SEROUS FLUID: 25 %
Neutrophil Count, Fluid: 40 %
Other Cells, Fluid: 1 %
WBC FLUID: 117 uL

## 2018-10-26 LAB — PHOSPHORUS
PHOSPHORUS: 2.3 mg/dL — AB (ref 2.5–4.6)
Phosphorus: 2.9 mg/dL (ref 2.5–4.6)

## 2018-10-26 LAB — PROTEIN, PLEURAL OR PERITONEAL FLUID

## 2018-10-26 LAB — ALBUMIN, PLEURAL OR PERITONEAL FLUID: Albumin, Fluid: <1 g/dL

## 2018-10-26 LAB — APTT
APTT: 32 s (ref 24–36)
aPTT: >160 seconds (ref 24–36)

## 2018-10-26 LAB — AMYLASE, PLEURAL OR PERITONEAL FLUID: AMYLASE FL: 10 U/L

## 2018-10-26 LAB — PROCALCITONIN: PROCALCITONIN: 0.39 ng/mL

## 2018-10-26 LAB — HEPARIN LEVEL (UNFRACTIONATED): Heparin Unfractionated: <0.1 IU/mL — ABNORMAL LOW (ref 0.30–0.70)

## 2018-10-26 LAB — MAGNESIUM
MAGNESIUM: 2.6 mg/dL — AB (ref 1.7–2.4)
MAGNESIUM: 2.2 mg/dL (ref 1.7–2.4)

## 2018-10-26 MED ORDER — LORAZEPAM 2 MG/ML IJ SOLN: 1.0000 mg | Freq: Four times a day (QID) | INTRAMUSCULAR | Status: AC | PRN

## 2018-10-26 MED ORDER — MIDODRINE HCL 5 MG PO TABS
5.0000 mg | ORAL_TABLET | Freq: Three times a day (TID) | ORAL | Status: DC
  Administered 2018-10-26 – 2018-10-29 (×6): 5 mg via ORAL
  Filled 2018-10-26 (×6): qty 1

## 2018-10-26 MED ORDER — VITAMIN B-1 100 MG PO TABS: 100.0000 mg | ORAL_TABLET | Freq: Every day | ORAL | Status: DC

## 2018-10-26 MED ORDER — POTASSIUM CHLORIDE IN NACL 40-0.9 MEQ/L-% IV SOLN
INTRAVENOUS | Status: DC
  Filled 2018-10-26 (×2): qty 1000

## 2018-10-26 MED ORDER — VENLAFAXINE HCL 37.5 MG PO TABS
37.5000 mg | ORAL_TABLET | Freq: Two times a day (BID) | ORAL | Status: DC
  Administered 2018-10-26 – 2018-10-29 (×6): 37.5 mg via ORAL
  Filled 2018-10-26 (×7): qty 1

## 2018-10-26 MED ORDER — POTASSIUM CHLORIDE 10 MEQ/50ML IV SOLN
10.0000 meq | INTRAVENOUS | Status: AC
  Administered 2018-10-26 (×4): 10 meq via INTRAVENOUS
  Filled 2018-10-26 (×4): qty 50

## 2018-10-26 MED ORDER — APIXABAN 5 MG PO TABS
5.0000 mg | ORAL_TABLET | Freq: Two times a day (BID) | ORAL | Status: DC
  Administered 2018-10-26 – 2018-10-29 (×7): 5 mg via ORAL
  Filled 2018-10-26 (×7): qty 1

## 2018-10-26 MED ORDER — AMIODARONE HCL 200 MG PO TABS
200.0000 mg | ORAL_TABLET | Freq: Every day | ORAL | Status: DC
  Administered 2018-10-26 – 2018-10-29 (×4): 200 mg via ORAL
  Filled 2018-10-26 (×4): qty 1

## 2018-10-26 MED ORDER — ADULT MULTIVITAMIN W/MINERALS CH: 1.0000 | ORAL_TABLET | Freq: Every day | ORAL | Status: DC

## 2018-10-26 MED ORDER — THIAMINE HCL 100 MG/ML IJ SOLN: 100.0000 mg | Freq: Every day | INTRAMUSCULAR | Status: DC

## 2018-10-26 MED ORDER — SODIUM CHLORIDE 0.9 % IV SOLN
INTRAVENOUS | Status: DC
  Administered 2018-10-26: 100 mL/h via INTRAVENOUS

## 2018-10-26 MED ORDER — FOLIC ACID 1 MG PO TABS
1.0000 mg | ORAL_TABLET | Freq: Every day | ORAL | Status: DC
  Administered 2018-10-27 – 2018-10-28 (×2): 1 mg via ORAL
  Filled 2018-10-26 (×2): qty 1

## 2018-10-26 MED ORDER — HEPARIN (PORCINE) 25000 UT/250ML-% IV SOLN: 800.0000 [IU]/h | INTRAVENOUS | Status: DC

## 2018-10-26 MED ORDER — MIDODRINE HCL 5 MG PO TABS: 5.0000 mg | ORAL_TABLET | Freq: Three times a day (TID) | ORAL | Status: DC

## 2018-10-26 MED ORDER — LORAZEPAM 1 MG PO TABS: 1.0000 mg | ORAL_TABLET | Freq: Four times a day (QID) | ORAL | Status: AC | PRN

## 2018-10-26 NOTE — Consult Note (Addendum)
ANTICOAGULATION CONSULT NOTE - Initial Consult  Pharmacy Consult for Heparin Indication: atrial fibrillation  No Known Allergies  Patient Measurements: Height: 5\' 9"  (175.3 cm) Weight: 150 lb (68 kg) IBW/kg (Calculated) : 66.2 Heparin Dosing Weight: 68 kg  Vital Signs: Temp: 98.6 F (37 C) (11/07 0400) Temp Source: Oral (11/07 0400) BP: 94/69 (11/07 1000) Pulse Rate: 82 (11/07 1000)  Labs: Recent Labs    10/24/18 1700 10/25/18 0519 10/25/18 1041 10/25/18 1638 10/25/18 2345 10/26/18 0621 10/26/18 0916  HGB 10.6* 11.3*  --   --   --  10.6*  --   HCT 30.1* 32.9*  --   --   --  30.5*  --   PLT 271 342  --   --   --  246  --   APTT  --   --   --  36 >160*  --  32  HEPARINUNFRC  --   --   --   --   --   --  <0.10*  CREATININE 1.51* 1.26* 1.05*  --  0.88 0.96  --   TROPONINI <0.03  --   --   --   --   --   --     Estimated Creatinine Clearance: 63.5 mL/min (by C-G formula based on SCr of 0.96 mg/dL).   Medical History: Past Medical History:  Diagnosis Date  . Cancer ([place])    skin  . Depression   . ETOH abuse     Medications:  Scheduled:  . famotidine  20 mg Oral BID  . folic acid  1 mg Oral Daily  . multivitamin with minerals  1 tablet Oral Daily  . sodium chloride flush  10-40 mL Intracatheter Q12H  . thiamine  100 mg Oral Daily    Assessment: Patient not on any PTA anticoagulants. Patient received Lovenox 40 mg SQ @ 3143 this AM. CHADSVASC ~1, PMH only consistent with afib, alcohol use, and skin cancer. Patient was on heparin, off since ~0300.  Goal of Therapy:  Heparin level 0.3-0.7 units/ml Monitor platelets by anticoagulation protocol: Yes   Plan:  Will initiate apixaban 5 mg BID for transition of care outpatient per CCM discussion. Will counsel patient.  Paticia Stack, PharmD Pharmacy Resident  10/26/2018 11:02 AM

## 2018-10-26 NOTE — Progress Notes (Addendum)
  Provided medication education regarding new apixaban (Eliquis) prescription. The patient, pharmacy resident Cyndee Brightly, PharmD, and me were in the room during the encounter.  Educated Ms. Grover about her indication for apixaban. She is taking apixaban for primary prevention of stroke related to A-fib. She is going to take 1 apixban 5 mg tablet twice daily, spacing out the two doses by 12 hours. Counseled on missed dose instructions and emphasized to her that she should not double up doses for missed dose. Counseled on increased bleeding risk of apixaban and signs of bleeding to look out for such as blood in stool and bruises that do not go away. Emphasized to Ms. Thera Flake that she need to seek medical attention if she noticed signs of uncontrolled bleeding or she falls and hit her head. Counseled on OTC products and advice Ms. Grover to take Tylenol for pain instead of NSAIDs.   Ms. Thera Flake expressed financial concerns during the encounter. Consulted care management.   Suzzanne Cloud  PharmD Candidate, Pharmacy Student

## 2018-10-26 NOTE — Consult Note (Signed)
[facility] NOTE  Pharmacy Consult for Amiodarone DDI Indication: atrial fibrillation   No Known Allergies   Medications:  Scheduled:  . apixaban  5 mg Oral BID  . famotidine  20 mg Oral BID  . folic acid  1 mg Oral Daily  . multivitamin with minerals  1 tablet Oral Daily  . sodium chloride flush  10-40 mL Intracatheter Q12H  . thiamine  100 mg Oral Daily    Assessment: Level D interactions with:  Ondansetron: Additional potential for QTc prolongation. EKG on 11/5 QTc 467. Received one dose IV 11/5, also prn orders for Ondansetron. Will monitor.  Pharmacy will continue to monitor.  Paticia Stack, PharmD Pharmacy Resident  10/26/2018 11:19 AM

## 2018-10-26 NOTE — Care Management Note (Signed)
Case Management Note  Patient Details  Name: [name] MRN: [id number] Date of Birth: 12/03/1956  Subjective/Objective:    Patient admitted to the hospital for sepsis.  Patient was brought to the ED by EMS for generalized weakness.  Patient reports that she lives in [place] in [place].  She reports that all utilities are included and she and her husband have been living here for 3 years, they both collect social security.  Patient reports that they have a car and she drives when she can.  Patient reports that for the past several months she has not been able to get out of bed.  RNCM questioned what was the precipitating factor and the patient reports that she just couldn't move- she reports that she would try to get up and her afib would start and she would have to sit down.  She got to the point where she was afraid to move.  Patient also reports that she has fallen 5 times and EMS has had to come and pick her up off the floor.  Patient reports that her husband is in poor health with liver and kidney disease.  She states that they have a walker and 2 canes, a bedside commode, and a shower chair.  She reports they also have a wheelchair.  Patient receives primary care services from [facility], and reports that she can get her medications very cheap here.  RNCM consulted for medication assistance.  Patient will be discharged on eliquis for A fib- MM application given.  MM contacted and Eliquis 5mg  is in stock-  Patient is floor status.  PT consult is in.  RNCM will cont to follow for discharge needs.    Doran Clay RN BSN [phone]             Action/Plan:   Expected Discharge Date:                  Expected Discharge Plan:     In-House Referral:  Clinical Social Work  Discharge planning Services  CM Consult  Post Acute Care Choice:    Choice offered to:     DME Arranged:    DME Agency:     HH Arranged:    [place] Agency:     Status of Service:  In process,  will continue to follow  If discussed at Long Length of Stay Meetings, dates discussed:    Additional Comments:  Shelbie Hutching, RN 10/26/2018, 2:51 PM

## 2018-10-26 NOTE — Progress Notes (Signed)
APTT >160 seconds, order from pharmacy for 1 hour. Gtt held. Pharmacy contacted and made aware of order to stop heparin gtt at 4 am for procedure. So will just hold heparin for now and re-dose and start after paracentesis.

## 2018-10-26 NOTE — Progress Notes (Addendum)
Talked to Dr. Posey Pronto about patient's BP of 83/64 tonight, patient BP has been running soft today. Patient asymptomatic. Patient had paracentesis today and removed fluids. Order place for Midodrine. RN will continue to monitor.

## 2018-10-26 NOTE — Consult Note (Signed)
ANTICOAGULATION CONSULT NOTE - Initial Consult  Pharmacy Consult for Heparin Indication: atrial fibrillation  No Known Allergies  Patient Measurements: Height: 5\' 9"  (175.3 cm) Weight: 150 lb (68 kg) IBW/kg (Calculated) : 66.2 Heparin Dosing Weight: 68 kg  Vital Signs: Temp: 97.6 F (36.4 C) (11/06 2000) Temp Source: Oral (11/06 2000) BP: 89/75 (11/06 2300) Pulse Rate: 102 (11/06 2300)  Labs: Recent Labs    10/24/18 1700 10/25/18 0519 10/25/18 1041 10/25/18 1638 10/25/18 2345  HGB 10.6* 11.3*  --   --   --   HCT 30.1* 32.9*  --   --   --   PLT 271 342  --   --   --   APTT  --   --   --  36 >160*  CREATININE 1.51* 1.26* 1.05*  --  0.88  TROPONINI <0.03  --   --   --   --     Estimated Creatinine Clearance: 69.3 mL/min (by C-G formula based on SCr of 0.88 mg/dL).   Medical History: Past Medical History:  Diagnosis Date  . Cancer ([place])    skin  . Depression   . ETOH abuse     Medications:  Scheduled:  . famotidine  20 mg Oral BID  . folic acid  1 mg Oral Daily  . multivitamin with minerals  1 tablet Oral Daily  . sodium chloride flush  10-40 mL Intracatheter Q12H  . thiamine  100 mg Oral Daily    Assessment: Patient not on any PTA anticoagulants. Patient received Lovenox 40 mg SQ @ 4008 this AM. CHADSVASC ~1, PMH only consistent with afib, alcohol use, and skin cancer.  Goal of Therapy:  Heparin level 0.3-0.7 units/ml Monitor platelets by anticoagulation protocol: Yes   Plan:  11/06 @ 2346 aPTT > 160 seconds, per RN patient not bleeding, instructed RN to hold drip for 1 hour. Will plan to restart at a lower rate of 800 units/hr and will recheck aPTT w/ rescheduled HL @ 0900. CBC f/u w/ am labs.  Tobie Lords, PharmD, BCPS Clinical Pharmacist 10/26/2018

## 2018-10-26 NOTE — Procedures (Signed)
Pre Procedural Dx: Symptomatic Ascites Post Procedural Dx: Same  Successful US guided paracentesis yielding 3.6 L of serous ascitic fluid.  EBL: None Complications: None immediate  Jay Corwyn Vora, MD Pager #: [phone]   

## 2018-10-26 NOTE — Progress Notes (Signed)
PT Cancellation Note  Patient Details Name: Solaris Kram MRN: [id number] DOB: October 20, 1956   Cancelled Treatment:    Reason Eval/Treat Not Completed: Patient at procedure or test/unavailable(Consult received and chart reviewed.  Patient off unit for thoracentesis.  Will re-attempt at later time/date as medically appropriate and available.)   Mya Suell H. Owens Shark, PT, DPT, NCS 10/26/18, 3:17 PM [phone]

## 2018-10-26 NOTE — Progress Notes (Signed)
Follow up - Critical Care Medicine Note  Patient Details:    [name] is an 62 y.o. female. with a PMH of ETOH abuse, A-fib, depression, and Cancer, who presents to [facility] ED on 10/24/18 with c/o generalized weakness. EMS was called out to the her residence (which is an extended-stay hotel), of whichpt was found in bed with adiaper on which she had defecated on herself. She reported that she had drunk alcohol earlier in the morning. She was noted by EMSto be in A-fib with RVR with HR approximately 115, and was noted to be hypotensive with SBP in the 70's. Upon arrival to the ED she was awake and alert, but speech was minimally slurred and she was very slow to respond to commands (butshe was able to follow commands). Initial workup in the ED revealed Lactic acid 2.6, WBC 9.4, negative troponin, Sodium 129, Potassium 2.8, Creatinine 1.51, Anion gap 17, and ammonia 48. UA is positive for UTI, and CXR is not concerning for any acutedisease process. Serum Ethyl alcohol level is less than 10, and Urine drug screen is negative. She is being admitted to [facility] ICU for Septic shock secondary to UTI requiring vasopressors, and A-fib with RVR.    Lines, Airways, Drains: PICC Triple Lumen 10/25/18 PICC Right Brachial 39 cm 0 cm (Active)  Indication for Insertion or Continuance of Line Vasoactive infusions 10/25/2018  7:46 PM  Exposed Catheter (cm) 0 cm 10/25/2018 12:00 PM  Site Assessment Clean;Dry;Intact 10/25/2018  7:46 PM  Lumen #1 Status Flushed;Blood return noted;Infusing 10/25/2018  7:46 PM  Lumen #2 Status Flushed;Blood return noted;Infusing 10/25/2018  7:46 PM  Lumen #3 Status Flushed;Saline locked;Blood return noted 10/25/2018  7:46 PM  Dressing Type Transparent;Securing device 10/25/2018  7:46 PM  Dressing Status Clean;Dry;Intact;Antimicrobial disc in place 10/25/2018  7:46 PM  Line Care Other (Comment);Connections checked and tightened 10/25/2018  7:46 PM  Dressing Intervention Other (Comment)  10/25/2018  7:46 PM  Dressing Change Due 11/01/18 10/25/2018  7:46 PM    Anti-infectives:  Anti-infectives (From admission, onward)   Start     Dose/Rate Route Frequency Ordered Stop   10/25/18 1000  cefTRIAXone (ROCEPHIN) 2 g in sodium chloride 0.9 % 100 mL IVPB     2 g 200 mL/hr over 30 Minutes Intravenous Every 24 hours 10/25/18 0828     10/24/18 1700  cefTRIAXone (ROCEPHIN) 1 g in sodium chloride 0.9 % 100 mL IVPB  Status:  Discontinued     1 g 200 mL/hr over 30 Minutes Intravenous Every 24 hours 10/24/18 1653 10/25/18 0828      Microbiology: Results for orders placed or performed during the hospital encounter of 10/24/18  Urine culture     Status: Abnormal   Collection Time: 10/24/18  5:00 PM  Result Value Ref Range Status   Specimen Description   Final    URINE, RANDOM Performed at [facility], [address] [place], [place] [address]    Special Requests   Final    NONE Performed at [facility], [address] [place], [place] [address]    Culture (A)  Final    >=100,000 COLONIES/mL LACTOBACILLUS SPECIES Standardized susceptibility testing for this organism is not available. Performed at [facility], [place] [address] [place], [place] [address]    Report Status 10/25/2018 FINAL  Final  Blood Culture (routine x 2)     Status: None (Preliminary result)   Collection Time: 10/24/18  5:01 PM  Result Value Ref Range Status  Specimen Description BLOOD BLOOD RIGHT WRIST  Final   Special Requests   Final    BOTTLES DRAWN AEROBIC AND ANAEROBIC Blood Culture adequate volume   Culture   Final    NO GROWTH < 24 HOURS Performed at [facility], [address] [place], [place] [address]    Report Status PENDING  Incomplete  Blood Culture (routine x 2)     Status: None (Preliminary result)   Collection Time: 10/24/18  5:01 PM  Result Value Ref Range Status   Specimen Description BLOOD RIGHT ANTECUBITAL  Final   Special Requests   Final     BOTTLES DRAWN AEROBIC AND ANAEROBIC Blood Culture adequate volume   Culture   Final    NO GROWTH < 24 HOURS Performed at [facility], [address], [place], [place] [address]    Report Status PENDING  Incomplete  MRSA PCR Screening     Status: None   Collection Time: 10/25/18  3:01 AM  Result Value Ref Range Status   MRSA by PCR NEGATIVE NEGATIVE Final    Comment:        The GeneXpert MRSA Assay (FDA approved for NASAL specimens only), is one component of a comprehensive MRSA colonization surveillance program. It is not intended to diagnose MRSA infection nor to guide or monitor treatment for MRSA infections. Performed at [facility], [address], [place], [place] [address]    Studies: Dg Abd 1 View  Result Date: 10/25/2018 CLINICAL DATA:  Abdominal distension. EXAM: ABDOMEN - 1 VIEW COMPARISON:  Abdominal radiograph November 27, 2017 FINDINGS: The bowel gas pattern is normal. No radio-opaque calculi or other significant radiographic abnormality are seen. Phleboliths project in the pelvis. Osteopenia. IMPRESSION: Normal bowel gas pattern. Electronically Signed   By: Elon Alas M.D.   On: 10/25/2018 16:30   US Renal  Result Date: 10/25/2018 CLINICAL DATA:  Acute renal failure. EXAM: RENAL / URINARY TRACT ULTRASOUND COMPLETE COMPARISON:  CT abdomen pelvis dated September 26, 2016. FINDINGS: Right Kidney: Renal measurements: 10.0 x 5.9 x 5.4 cm = volume: 146 mL . Echogenicity within normal limits. No mass or hydronephrosis visualized. Two simple cysts are identified at the upper pole, the largest measuring 1.9 cm. Left Kidney: Renal measurements: 10.8 x 4.5 x 5.9 cm = volume: 150 mL. Echogenicity within normal limits. No mass or hydronephrosis visualized. Bladder: Mobile, layering debris within the bladder. Incidental note is made of moderate ascites. IMPRESSION: 1. No acute abnormality. 2. Layering debris within the bladder.  Correlate with urinalysis. 3.  Ascites. Electronically Signed   By: Titus Dubin M.D.   On: 10/25/2018 10:25   Dg Chest Portable 1 View  Result Date: 10/24/2018 CLINICAL DATA:  Weakness EXAM: PORTABLE CHEST 1 VIEW COMPARISON:  05/19/2018 FINDINGS: The heart size and mediastinal contours are within normal limits. Both lungs are clear. The visualized skeletal structures are unremarkable. IMPRESSION: No active disease. Electronically Signed   By: Donavan Foil M.D.   On: 10/24/2018 17:23   [place] Ekg Site Rite  Result Date: 10/25/2018 If Site Rite image not attached, placement could not be confirmed due to current cardiac rhythm.   Consults: Treatment Team:  Corey Skains, MD   Subjective:    Overnight Issues: Patient has done well over the past 24 hours.  Has been weaned off of pressors.  Her atrial fibrillation is now rate controlled we will convert to oral amiodarone and change heparin to DOAC  Objective:  Vital signs for  last 24 hours: Temp:  [97.6 F (36.4 C)-98.9 F (37.2 C)] 98.6 F (37 C) (11/07 0400) Pulse Rate:  [81-113] 86 (11/07 0600) Resp:  [15-25] 19 (11/07 0600) BP: (76-101)/(53-78) 91/71 (11/07 0600) SpO2:  [91 %-98 %] 97 % (11/07 0600)  Hemodynamic parameters for last 24 hours:    Intake/Output from previous day: 11/06 0701 - 11/07 0700 In: 6286.4 [P.O.:1120; I.V.:4147.9; IV Piggyback:1018.5] Out: -   Intake/Output this shift: No intake/output data recorded.  Vent settings for last 24 hours:    Physical Exam:  HEENT: Patient is awake, alert no acute distress, trachea midline, no oral lesions appreciated Cardiovascular: Irregularly irregular rhythm with controlled ventricular response Pulmonary: Clear to auscultation Abdominal: Positive bowel sounds, soft exam Extremities: No clubbing, cyanosis or edema noted PICC line in place Neurologic: Patient moves all extremities without any deficits  Assessment/Plan:   Septic shock.  Patient is empirically on Rocephin for most likely  urosepsis.  Has been weaned off of pressors and is clinically improved  Atrial fibrillation.  Controlled on IV amiodarone will switch to p.o. on anticoagulation systemic  Ascites.  Patient with ascites on renal ultrasound yesterday, obtaining diagnostic paracentesis  Hyponatremia. slowly increase  Anemia no evidence of active bleeding  Patient  is stable for floor transfer to monitored bed Khadir Roam 10/26/2018  *Care during the described time interval was provided by me and/or other providers on the critical care team.  I have reviewed this patient's available data, including medical history, events of note, physical examination and test results as part of my evaluation.

## 2018-10-26 NOTE — Consult Note (Signed)
Pharmacy Electrolyte Monitoring Consult:  Pharmacy consulted to assist in monitoring and replacing electrolytes in this 62 y.o. female admitted on 10/24/2018 with Weakness   Labs:  Sodium (mmol/L)  Date Value  10/25/2018 127 (L)   Potassium (mmol/L)  Date Value  10/25/2018 3.0 (L)   Magnesium (mg/dL)  Date Value  10/25/2018 2.6 (H)   Phosphorus (mg/dL)  Date Value  10/25/2018 2.9   Calcium (mg/dL)  Date Value  10/25/2018 6.7 (L)   Albumin (g/dL)  Date Value  10/25/2018 1.9 (L)    Assessment/Plan: 11/06 2200 K 3.0 -- will replace via KCI 10 mEq IV x 4 and still receiving NS+ 40 mEq @ 100 ml/hr will reassess after am labs.  Pharmacy will continue to monitor.  Tobie Lords, PharmD, BCPS Clinical Pharmacist 10/26/2018

## 2018-10-26 NOTE — Progress Notes (Signed)
[facility] Encounter Note  Patient: [name] / Admit Date: 10/24/2018 / Date of Encounter: 10/26/2018, 12:53 PM   Subjective: Patient is significantly improved from admission.  Less discomfort nausea and other concerns.  Patient has had spontaneous conversion of atrial fibrillation to normal sinus rhythm.  No evidence of congestive heart failure or myocardial infarction  Review of Systems: Positive for: Shortness of breath Negative for: Vision change, hearing change, syncope, dizziness, nausea, vomiting,diarrhea, bloody stool, stomach pain, cough, congestion, diaphoresis, urinary frequency, urinary pain,skin lesions, skin rashes Others previously listed  Objective: Telemetry: Normal sinus rhythm Physical Exam: Blood pressure 101/75, pulse 83, temperature 97.7 F (36.5 C), temperature source Oral, resp. rate 19, height 5\' 9"  (1.753 m), weight 68 kg, SpO2 98 %. Body mass index is 22.15 kg/m. General: Well developed, well nourished, in no acute distress. Head: Normocephalic, atraumatic, sclera non-icteric, no xanthomas, nares are without discharge. Neck: No apparent masses Lungs: Normal respirations with diffuse wheezes, no rhonchi, no rales , no crackles   Heart: Regular rate and rhythm, normal S1 S2, no murmur, no rub, no gallop, PMI is normal size and placement, carotid upstroke normal without bruit, jugular venous pressure normal Abdomen: Soft, non-tender, non-distended with normoactive bowel sounds. No hepatosplenomegaly. Abdominal aorta is normal size without bruit Extremities: No edema, no clubbing, no cyanosis, no ulcers,  Peripheral: 2+ radial, 2+ femoral, 2+ dorsal pedal pulses Neuro: Alert and oriented. Moves all extremities spontaneously. Psych:  Responds to questions appropriately with a normal affect.   Intake/Output Summary (Last 24 hours) at 10/26/2018 1253 Last data filed at 10/26/2018 1019 Gross per 24 hour  Intake 6286.36 ml  Output -  Net  6286.36 ml    Inpatient Medications:  . apixaban  5 mg Oral BID  . famotidine  20 mg Oral BID  . folic acid  1 mg Oral Daily  . multivitamin with minerals  1 tablet Oral Daily  . sodium chloride flush  10-40 mL Intracatheter Q12H  . thiamine  100 mg Oral Daily   Infusions:  . 0.9 % NaCl with KCl 40 mEq / L 100 mL/hr (10/26/18 0755)  . amiodarone 30 mg/hr (10/26/18 0630)  . cefTRIAXone (ROCEPHIN)  IV 2 g (10/26/18 0906)    Labs: Recent Labs    10/25/18 2345 10/26/18 0621  NA 127* 128*  K 3.0* 4.1  CL 94* 97*  CO2 23 23  GLUCOSE 121* 103*  BUN 21 18  CREATININE 0.88 0.96  CALCIUM 6.7* 6.8*  MG 2.6* 2.2  PHOS 2.9 2.3*   Recent Labs    10/24/18 1700 10/25/18 0519 10/25/18 1041  AST 53* 46*  --   ALT 18 17  --   ALKPHOS 122 110  --   BILITOT 2.5* 1.8*  --   PROT 6.4* 5.9*  --   ALBUMIN 2.1* 1.9* 1.9*   Recent Labs    10/24/18 1700 10/25/18 0519 10/26/18 0621  WBC 9.4 19.1* 9.7  NEUTROABS 7.5  --   --   HGB 10.6* 11.3* 10.6*  HCT 30.1* 32.9* 30.5*  MCV 97.4 98.5 97.8  PLT 271 342 246   Recent Labs    10/24/18 1700  TROPONINI <0.03   Invalid input(s): POCBNP No results for input(s): HGBA1C in the last 72 hours.   Weights: Filed Weights   10/24/18 1656  Weight: 68 kg     Radiology/Studies:  Dg Abd 1 View  Result Date: 10/25/2018 CLINICAL DATA:  Abdominal distension. EXAM: ABDOMEN - 1  VIEW COMPARISON:  Abdominal radiograph November 27, 2017 FINDINGS: The bowel gas pattern is normal. No radio-opaque calculi or other significant radiographic abnormality are seen. Phleboliths project in the pelvis. Osteopenia. IMPRESSION: Normal bowel gas pattern. Electronically Signed   By: Elon Alas M.D.   On: 10/25/2018 16:30   US Renal  Result Date: 10/25/2018 CLINICAL DATA:  Acute renal failure. EXAM: RENAL / URINARY TRACT ULTRASOUND COMPLETE COMPARISON:  CT abdomen pelvis dated September 26, 2016. FINDINGS: Right Kidney: Renal measurements: 10.0 x 5.9 x 5.4  cm = volume: 146 mL . Echogenicity within normal limits. No mass or hydronephrosis visualized. Two simple cysts are identified at the upper pole, the largest measuring 1.9 cm. Left Kidney: Renal measurements: 10.8 x 4.5 x 5.9 cm = volume: 150 mL. Echogenicity within normal limits. No mass or hydronephrosis visualized. Bladder: Mobile, layering debris within the bladder. Incidental note is made of moderate ascites. IMPRESSION: 1. No acute abnormality. 2. Layering debris within the bladder.  Correlate with urinalysis. 3. Ascites. Electronically Signed   By: Titus Dubin M.D.   On: 10/25/2018 10:25   Dg Chest Portable 1 View  Result Date: 10/24/2018 CLINICAL DATA:  Weakness EXAM: PORTABLE CHEST 1 VIEW COMPARISON:  05/19/2018 FINDINGS: The heart size and mediastinal contours are within normal limits. Both lungs are clear. The visualized skeletal structures are unremarkable. IMPRESSION: No active disease. Electronically Signed   By: Donavan Foil M.D.   On: 10/24/2018 17:23   [place] Ekg Site Rite  Result Date: 10/25/2018 If Site Rite image not attached, placement could not be confirmed due to current cardiac rhythm.    Assessment and Recommendation  62 y.o. female with paroxysmal nonvalvular atrial fibrillation for which the patient has not been compliant with medication management and now has had an exacerbation of atrial fibrillation with rapid ventricular rate secondary to current illness without evidence of congestive heart failure or myocardial infarction 1.  Continue amiodarone although change to oral medication management at 200 mg each day 2.  Anticoagulation for further risk reduction in stroke with atrial fibrillation for 1 month after evidence of atrial fibrillation 3.  Hypertension control as necessary 4.  No further cardiac diagnostics necessary at this time 5.  Call if further questions  Signed, Serafina Royals M.D. FACC

## 2018-10-26 NOTE — Progress Notes (Signed)
Patient told this RN that she is refusing to have her paracentesis done this day. She stated she may do it at another time, but not today. Radiology notified.

## 2018-10-26 NOTE — Consult Note (Signed)
[facility] Consult   Reason for Consult: Consult for this 62 year old woman with a history of alcohol abuse and depression Referring Physician: Marthann Schiller Patient Identification: [name] MRN:  [id number] Principal Diagnosis: Sepsis [facility]) Diagnosis:   Patient Active Problem List   Diagnosis Date Noted  . Dysthymia [F34.1] 10/26/2018  . Sepsis ([place]) [A41.9] 10/25/2018  . ETOH abuse [F10.10] 10/25/2018  . A-fib ([place]) [I48.91] 11/26/2017  . Malnutrition of moderate degree [E44.0] 09/27/2016  . Acute pancreatitis [K85.90] 09/26/2016    Total Time spent with patient: 1 hour  Subjective:   [name] is a 62 y.o. female patient admitted with "I knew I was in bad shape".  HPI: Patient seen and chart reviewed.  62 year old woman who was brought into the hospital after EMS was called to her hotel room and found her unable to get out of bed lying in stool.  Patient was septic with a urinary tract infection and was hospitalized in the ICU.  She is recovering from the sepsis but continues to be very weak.  Consultation for depression.  Patient says that her mood recently had been down and depressed.  Tired all the time.  Poor concentration.  Denied however ever having any suicidal thoughts denied any hallucinations.  Patient relates all of this to her recent heavy drinking.  Admits that for several weeks she has been drinking probably almost a liter of liquor a day.  She has a long-standing history of alcohol abuse but has had periods of brief sobriety and lesser intake but recently she and her husband seem to have been on a bit of a binge.  She stopped taking her antidepressant medicine which was Effexor several weeks ago.  Social history: Patient is married she and her husband, who is older than her, live in an extended care motel.  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  She has no other family she stays in touch with.  She says her husband drinks even more than she does.  They managed  to survive by making agreements with other people to let them use their car in exchange for the people buying them groceries.  Medical history: Patient has evidence of cirrhosis.  Had paracentesis today.  Chronic atrial fibrillation malnutrition pancreatitis  Substance abuse history: Long-standing alcohol abuse going back decades.  She says that there have been times in the past when she was in programs promoting sobriety when she was able to stay sober for up to years of the time.  In the last year there have been brief periods of complete sobriety interspersed with more heavy periods of drinking.  No history of any other drug abuse.  Past Psychiatric History: Long-standing mild depression.  No history of suicide attempts.  Very distant history decades ago of one psychiatric hospitalization.  No history of psychosis or mania.  Was taking Effexor regularly until she ran out of her medicine.  Risk to Self:   Risk to Others:   Prior Inpatient Therapy:   Prior Outpatient Therapy:    Past Medical History:  Past Medical History:  Diagnosis Date  . Cancer ([place])    skin  . Depression   . ETOH abuse     Past Surgical History:  Procedure Laterality Date  . MOLE REMOVAL     Family History:  Family History  Problem Relation Age of Onset  . Breast cancer Neg Hx    Family Psychiatric  History: None known Social History:  Social History   Substance and Sexual  Activity  Alcohol Use Yes  . Alcohol/week: 1.0 standard drinks  . Types: 1 Shots of liquor per week     Social History   Substance and Sexual Activity  Drug Use No    Social History   Socioeconomic History  . Marital status: Married    Spouse name: Not on file  . Number of children: Not on file  . Years of education: Not on file  . Highest education level: Not on file  Occupational History  . Not on file  Social Needs  . Financial resource strain: Not on file  . Food insecurity:    Worry: Not on file    Inability: Not  on file  . Transportation needs:    Medical: Not on file    Non-medical: Not on file  Tobacco Use  . Smoking status: Current Every Day Smoker    Packs/day: 0.25    Types: Cigarettes  . Smokeless tobacco: Never Used  Substance and Sexual Activity  . Alcohol use: Yes    Alcohol/week: 1.0 standard drinks    Types: 1 Shots of liquor per week  . Drug use: No  . Sexual activity: Never  Lifestyle  . Physical activity:    Days per week: Not on file    Minutes per session: Not on file  . Stress: Not on file  Relationships  . Social connections:    Talks on phone: Not on file    Gets together: Not on file    Attends religious service: Not on file    Active member of club or organization: Not on file    Attends meetings of clubs or organizations: Not on file    Relationship status: Not on file  Other Topics Concern  . Not on file  Social History Narrative  . Not on file   Additional Social History:    Allergies:  No Known Allergies  Labs:  Results for orders placed or performed during the hospital encounter of 10/24/18 (from the past 48 hour(s))  Type and screen Ordered by PROVIDER DEFAULT     Status: None   Collection Time: 10/24/18  5:56 PM  Result Value Ref Range   ABO/RH(D) B POS    Antibody Screen NEG    Sample Expiration      10/27/2018 Performed at [facility], [address], [place], [place] [address]   Lactic acid, plasma     Status: None   Collection Time: 10/24/18  7:00 PM  Result Value Ref Range   Lactic Acid, Venous 1.8 0.5 - 1.9 mmol/L    Comment: Performed at [facility], [address] [place], [place] [address]  Glucose, capillary     Status: Abnormal   Collection Time: 10/25/18  2:53 AM  Result Value Ref Range   Glucose-Capillary 149 (H) 70 - 99 mg/dL  MRSA PCR Screening     Status: None   Collection Time: 10/25/18  3:01 AM  Result Value Ref Range   MRSA by PCR NEGATIVE NEGATIVE    Comment:        The GeneXpert MRSA Assay  (FDA approved for NASAL specimens only), is one component of a comprehensive MRSA colonization surveillance program. It is not intended to diagnose MRSA infection nor to guide or monitor treatment for MRSA infections. Performed at [facility], [address], [place], [place] [address]   Procalcitonin - Baseline     Status: None   Collection Time: 10/25/18  5:19 AM  Result Value Ref Range   Procalcitonin 0.47 ng/mL    Comment:        Interpretation: PCT (Procalcitonin) <= 0.5 ng/mL: Systemic infection (sepsis) is not likely. Local bacterial infection is possible. (NOTE)       Sepsis PCT Algorithm           Lower Respiratory Tract                                      Infection PCT Algorithm    ----------------------------     ----------------------------         PCT < 0.25 ng/mL                PCT < 0.10 ng/mL         Strongly encourage             Strongly discourage   discontinuation of antibiotics    initiation of antibiotics    ----------------------------     -----------------------------       PCT 0.25 - 0.50 ng/mL            PCT 0.10 - 0.25 ng/mL               OR       >80% decrease in PCT            Discourage initiation of                                            antibiotics      Encourage discontinuation           of antibiotics    ----------------------------     -----------------------------         PCT >= 0.50 ng/mL              PCT 0.26 - 0.50 ng/mL               AND        <80% decrease in PCT             Encourage initiation of                                             antibiotics       Encourage continuation           of antibiotics    ----------------------------     -----------------------------        PCT >= 0.50 ng/mL                  PCT > 0.50 ng/mL               AND         increase in PCT                  Strongly encourage                                      initiation of antibiotics    Strongly encourage escalation  of  antibiotics                                     -----------------------------                                           PCT <= 0.25 ng/mL                                                 OR                                        > 80% decrease in PCT                                     Discontinue / Do not initiate                                             antibiotics Performed at [facility], [address] [place], [place] [address]   CBC     Status: Abnormal   Collection Time: 10/25/18  5:19 AM  Result Value Ref Range   WBC 19.1 (H) 4.0 - 10.5 K/uL   RBC 3.34 (L) 3.87 - 5.11 MIL/uL   Hemoglobin 11.3 (L) 12.0 - 15.0 g/dL   HCT 32.9 (L) 36.0 - 46.0 %   MCV 98.5 80.0 - 100.0 fL   MCH 33.8 26.0 - 34.0 pg   MCHC 34.3 30.0 - 36.0 g/dL   RDW 12.5 11.5 - 15.5 %   Platelets 342 150 - 400 K/uL   nRBC 0.0 0.0 - 0.2 %    Comment: Performed at [facility], [address] [place], [place] [address]  Comprehensive metabolic panel     Status: Abnormal   Collection Time: 10/25/18  5:19 AM  Result Value Ref Range   Sodium 127 (L) 135 - 145 mmol/L   Potassium <2.0 (LL) 3.5 - 5.1 mmol/L    Comment: CRITICAL RESULT CALLED TO, READ BACK BY AND VERIFIED WITH  CHELSEA [facility] AT 4496 10/25/18 SDR    Chloride 89 (L) 98 - 111 mmol/L   CO2 24 22 - 32 mmol/L   Glucose, Bld 160 (H) 70 - 99 mg/dL   BUN 26 (H) 8 - 23 mg/dL   Creatinine, Ser 1.26 (H) 0.44 - 1.00 mg/dL   Calcium 6.9 (L) 8.9 - 10.3 mg/dL   Total Protein 5.9 (L) 6.5 - 8.1 g/dL   Albumin 1.9 (L) 3.5 - 5.0 g/dL   AST 46 (H) 15 - 41 U/L   ALT 17 0 - 44 U/L   Alkaline Phosphatase 110 38 - 126 U/L   Total Bilirubin 1.8 (H) 0.3 - 1.2 mg/dL   GFR calc non Af Amer 45 (L) >60 mL/min   GFR calc Af Amer 52 (L) >60 mL/min    Comment: (NOTE) The eGFR  has been calculated using the CKD EPI equation. This calculation has not been validated in all clinical situations. eGFR's persistently <60 mL/min signify possible Chronic  Kidney Disease.    Anion gap 14 5 - 15    Comment: Performed at [facility], [address] [place], [place] [address]  Lactic acid, plasma     Status: None   Collection Time: 10/25/18  5:19 AM  Result Value Ref Range   Lactic Acid, Venous 1.3 0.5 - 1.9 mmol/L    Comment: Performed at [facility], [address] [place], [place] [address]  Potassium     Status: Abnormal   Collection Time: 10/25/18 10:41 AM  Result Value Ref Range   Potassium 2.6 (LL) 3.5 - 5.1 mmol/L    Comment: CRITICAL RESULT CALLED TO, READ BACK BY AND VERIFIED WITH STACY COLEY AT 1100 ON 10/25/18 [place]. Performed at [facility], [address] [place], [place] [address]   Basic metabolic panel     Status: Abnormal   Collection Time: 10/25/18 10:41 AM  Result Value Ref Range   Sodium 126 (L) 135 - 145 mmol/L   Potassium 2.6 (LL) 3.5 - 5.1 mmol/L    Comment: CRITICAL RESULT CALLED TO, READ BACK BY AND VERIFIED WITH STACY COLEY AT 1100 ON 10/25/18 [place].    Chloride 92 (L) 98 - 111 mmol/L   CO2 21 (L) 22 - 32 mmol/L   Glucose, Bld 119 (H) 70 - 99 mg/dL   BUN 24 (H) 8 - 23 mg/dL   Creatinine, Ser 1.05 (H) 0.44 - 1.00 mg/dL   Calcium 6.9 (L) 8.9 - 10.3 mg/dL   GFR calc non Af Amer 56 (L) >60 mL/min   GFR calc Af Amer >60 >60 mL/min    Comment: (NOTE) The eGFR has been calculated using the CKD EPI equation. This calculation has not been validated in all clinical situations. eGFR's persistently <60 mL/min signify possible Chronic Kidney Disease.    Anion gap 13 5 - 15    Comment: Performed at [facility], [address] [place], [place] [address]  Magnesium     Status: Abnormal   Collection Time: 10/25/18 10:41 AM  Result Value Ref Range   Magnesium 1.5 (L) 1.7 - 2.4 mg/dL    Comment: Performed at [facility], [address] [place], [place] [address]  Phosphorus     Status: Abnormal   Collection Time: 10/25/18 10:41 AM  Result Value Ref Range    Phosphorus 1.8 (L) 2.5 - 4.6 mg/dL    Comment: Performed at [facility], [address] [place], [place] [address]  Albumin     Status: Abnormal   Collection Time: 10/25/18 10:41 AM  Result Value Ref Range   Albumin 1.9 (L) 3.5 - 5.0 g/dL    Comment: Performed at [facility], [address] [place], [place] [address]  APTT     Status: None   Collection Time: 10/25/18  4:38 PM  Result Value Ref Range   aPTT 36 24 - 36 seconds    Comment: Performed at [facility], [address] [place], [place] [address]  Phosphorus     Status: None   Collection Time: 10/25/18 11:45 PM  Result Value Ref Range   Phosphorus 2.9 2.5 - 4.6 mg/dL    Comment: Performed at [facility], [address] [place], [place] [address]  Basic metabolic panel     Status: Abnormal   Collection  Time: 10/25/18 11:45 PM  Result Value Ref Range   Sodium 127 (L) 135 - 145 mmol/L   Potassium 3.0 (L) 3.5 - 5.1 mmol/L   Chloride 94 (L) 98 - 111 mmol/L   CO2 23 22 - 32 mmol/L   Glucose, Bld 121 (H) 70 - 99 mg/dL   BUN 21 8 - 23 mg/dL   Creatinine, Ser 0.88 0.44 - 1.00 mg/dL   Calcium 6.7 (L) 8.9 - 10.3 mg/dL   GFR calc non Af Amer >60 >60 mL/min   GFR calc Af Amer >60 >60 mL/min    Comment: (NOTE) The eGFR has been calculated using the CKD EPI equation. This calculation has not been validated in all clinical situations. eGFR's persistently <60 mL/min signify possible Chronic Kidney Disease.    Anion gap 10 5 - 15    Comment: Performed at [facility] [address] [place], [place] [address]  Magnesium     Status: Abnormal   Collection Time: 10/25/18 11:45 PM  Result Value Ref Range   Magnesium 2.6 (H) 1.7 - 2.4 mg/dL    Comment: Performed at [facility], [address] [place], [place] [address]  APTT     Status: Abnormal   Collection Time: 10/25/18 11:45 PM  Result Value Ref Range   aPTT >160 (HH) 24 - 36 seconds    Comment:        IF  BASELINE aPTT IS ELEVATED, SUGGEST PATIENT RISK ASSESSMENT BE USED TO DETERMINE APPROPRIATE ANTICOAGULANT THERAPY. CRITICAL RESULT CALLED TO, READ BACK BY AND VERIFIED WITH: CHELSEA WRENN '@[id number]'$  10/26/18 [place] Performed at [facility], [address] [place], [place] [address]   Magnesium     Status: None   Collection Time: 10/26/18  6:21 AM  Result Value Ref Range   Magnesium 2.2 1.7 - 2.4 mg/dL    Comment: Performed at [facility], [address] [place], [place] [address]  Phosphorus     Status: Abnormal   Collection Time: 10/26/18  6:21 AM  Result Value Ref Range   Phosphorus 2.3 (L) 2.5 - 4.6 mg/dL    Comment: Performed at [facility], [address] [place], [place] [address]  Procalcitonin     Status: None   Collection Time: 10/26/18  6:21 AM  Result Value Ref Range   Procalcitonin 0.39 ng/mL    Comment:        Interpretation: PCT (Procalcitonin) <= 0.5 ng/mL: Systemic infection (sepsis) is not likely. Local bacterial infection is possible. (NOTE)       Sepsis PCT Algorithm           Lower Respiratory Tract                                      Infection PCT Algorithm    ----------------------------     ----------------------------         PCT < 0.25 ng/mL                PCT < 0.10 ng/mL         Strongly encourage             Strongly discourage   discontinuation of antibiotics    initiation of antibiotics    ----------------------------     -----------------------------       PCT 0.25 - 0.50 ng/mL  PCT 0.10 - 0.25 ng/mL               OR       >80% decrease in PCT            Discourage initiation of                                            antibiotics      Encourage discontinuation           of antibiotics    ----------------------------     -----------------------------         PCT >= 0.50 ng/mL              PCT 0.26 - 0.50 ng/mL               AND        <80% decrease in PCT             Encourage initiation of                                              antibiotics       Encourage continuation           of antibiotics    ----------------------------     -----------------------------        PCT >= 0.50 ng/mL                  PCT > 0.50 ng/mL               AND         increase in PCT                  Strongly encourage                                      initiation of antibiotics    Strongly encourage escalation           of antibiotics                                     -----------------------------                                           PCT <= 0.25 ng/mL                                                 OR                                        > 80% decrease in PCT  Discontinue / Do not initiate                                             antibiotics Performed at [facility], [address] [place], [place] [address]   Basic metabolic panel     Status: Abnormal   Collection Time: 10/26/18  6:21 AM  Result Value Ref Range   Sodium 128 (L) 135 - 145 mmol/L   Potassium 4.1 3.5 - 5.1 mmol/L   Chloride 97 (L) 98 - 111 mmol/L   CO2 23 22 - 32 mmol/L   Glucose, Bld 103 (H) 70 - 99 mg/dL   BUN 18 8 - 23 mg/dL   Creatinine, Ser 0.96 0.44 - 1.00 mg/dL   Calcium 6.8 (L) 8.9 - 10.3 mg/dL   GFR calc non Af Amer >60 >60 mL/min   GFR calc Af Amer >60 >60 mL/min    Comment: (NOTE) The eGFR has been calculated using the CKD EPI equation. This calculation has not been validated in all clinical situations. eGFR's persistently <60 mL/min signify possible Chronic Kidney Disease.    Anion gap 8 5 - 15    Comment: Performed at [facility], [address] [place], [place] [address]  CBC     Status: Abnormal   Collection Time: 10/26/18  6:21 AM  Result Value Ref Range   WBC 9.7 4.0 - 10.5 K/uL   RBC 3.12 (L) 3.87 - 5.11 MIL/uL   Hemoglobin 10.6 (L) 12.0 - 15.0 g/dL   HCT 30.5 (L) 36.0 - 46.0 %   MCV 97.8 80.0 - 100.0 fL   MCH 34.0 26.0 - 34.0 pg    MCHC 34.8 30.0 - 36.0 g/dL   RDW 12.8 11.5 - 15.5 %   Platelets 246 150 - 400 K/uL   nRBC 0.0 0.0 - 0.2 %    Comment: Performed at [facility], [address] [place], [place] [address]  APTT     Status: None   Collection Time: 10/26/18  9:16 AM  Result Value Ref Range   aPTT 32 24 - 36 seconds    Comment: Performed at [facility], [place], [place] [address]  Heparin level (unfractionated)     Status: Abnormal   Collection Time: 10/26/18  9:16 AM  Result Value Ref Range   Heparin Unfractionated <0.10 (L) 0.30 - 0.70 IU/mL    Comment: (NOTE) If heparin results are below expected values, and patient dosage has  been confirmed, suggest follow up testing of antithrombin III levels. Performed at [facility], [address] [place],  [address]   Amylase, pleural or peritoneal fluid     Status: None   Collection Time: 10/26/18  3:15 PM  Result Value Ref Range   Amylase, Fluid 10 U/L    Comment: NO NORMAL RANGE ESTABLISHED FOR THIS TEST   Fluid Type-FAMY CYTOPERI     Comment: Performed at [facility], [address] [place],  [address]  Albumin, pleural or peritoneal fluid     Status: None   Collection Time: 10/26/18  3:15 PM  Result Value Ref Range   Albumin, Fluid <1.0 g/dL    Comment: (NOTE) No normal range established for this test Results should be evaluated in conjunction with serum values    Fluid Type-FALB PLEU     Comment:  Performed at [facility], [address] [place], [place] [address] CORRECTED ON 11/07 AT 1631: PREVIOUSLY REPORTED AS CYTOPERI   Protein, pleural or peritoneal fluid     Status: None   Collection Time: 10/26/18  3:15 PM  Result Value Ref Range   Total protein, fluid <3.0 g/dL    Comment: (NOTE) No normal range established for this test Results should be evaluated in conjunction with serum values    Fluid Type-FTP PLEU     Comment: Performed at [facility], [address] [place], [place] [address] CORRECTED ON 11/07 AT 1631: PREVIOUSLY REPORTED AS CYTOPERI     Current Facility-Administered Medications  Medication Dose Route Frequency Provider Last Rate Last Dose  . 0.9 %  sodium chloride infusion   Intravenous Continuous Conforti, John, DO 100 mL/hr at 10/26/18 1618 100 mL/hr at 10/26/18 1618  . acetaminophen (TYLENOL) tablet 650 mg  650 mg Oral Q6H PRN Lance Coon, MD       Or  . acetaminophen (TYLENOL) suppository 650 mg  650 mg Rectal Q6H PRN Lance Coon, MD      . amiodarone (PACERONE) tablet 200 mg  200 mg Oral Daily Corey Skains, MD   200 mg at 10/26/18 1616  . apixaban (ELIQUIS) tablet 5 mg  5 mg Oral BID Paticia Stack, RPH   5 mg at 10/26/18 1233  . cefTRIAXone (ROCEPHIN) 2 g in sodium chloride 0.9 % 100 mL IVPB  2 g Intravenous Q24H Conforti, John, DO 200 mL/hr at 10/26/18 0906 2 g at 10/26/18 0906  . famotidine (PEPCID) tablet 20 mg  20 mg Oral BID Conforti, John, DO   20 mg at 10/26/18 0908  . folic acid (FOLVITE) tablet 1 mg  1 mg Oral Daily Conforti, John, DO      . LORazepam (ATIVAN) tablet 1 mg  1 mg Oral Q6H PRN Conforti, John, DO       Or  . LORazepam (ATIVAN) injection 1 mg  1 mg Intravenous Q6H PRN Conforti, John, DO      . multivitamin with minerals tablet 1 tablet  1 tablet Oral Daily Conforti, John, DO   1 tablet at 10/26/18 0908  . ondansetron (ZOFRAN) tablet 4 mg  4 mg Oral Q6H PRN Lance Coon, MD      . sodium chloride flush (NS) 0.9 % injection 10-40 mL  10-40 mL Intracatheter Q12H Vaughan Basta, MD   40 mL at 10/26/18 0925  . sodium chloride flush (NS) 0.9 % injection 10-40 mL  10-40 mL Intracatheter PRN Vaughan Basta, MD      . thiamine (VITAMIN B-1) tablet 100 mg  100 mg Oral Daily Conforti, John, DO   100 mg at 10/26/18 0908  . venlafaxine (EFFEXOR) tablet 37.5 mg  37.5 mg Oral BID WC Clapacs, Madie Reno, MD        Musculoskeletal: Strength & Muscle Tone:  atrophy Gait & Station: unable to stand Patient leans: N/A  Psychiatric Specialty Exam: Physical Exam  Nursing note and vitals reviewed. Constitutional: She appears well-developed and well-nourished.  HENT:  Head: Normocephalic and atraumatic.  Eyes: Pupils are equal, round, and reactive to light. Conjunctivae are normal.  Neck: Normal range of motion.  Cardiovascular: Normal heart sounds.  Respiratory: Effort normal.  GI: Soft. She exhibits distension.  Musculoskeletal: Normal range of motion.       Legs: Neurological: She is alert.  Skin: Skin is warm and dry.  Psychiatric: She has a normal  mood and affect. Her speech is normal and behavior is normal. Judgment and thought content normal. She exhibits abnormal recent memory.    Review of Systems  Constitutional: Negative.   HENT: Negative.   Eyes: Negative.   Respiratory: Negative.   Cardiovascular: Negative.   Gastrointestinal: Negative.   Musculoskeletal: Negative.   Skin: Negative.   Neurological: Negative.   Psychiatric/Behavioral: Positive for memory loss and substance abuse. Negative for depression, hallucinations and suicidal ideas. The patient has insomnia. The patient is not nervous/anxious.     Blood pressure (!) 81/55, pulse 84, temperature 97.6 F (36.4 C), temperature source Oral, resp. rate 14, height '5\' 9"'$  (1.753 m), weight 68 kg, SpO2 100 %.Body mass index is 22.15 kg/m.  General Appearance: Disheveled  Eye Contact:  Good  Speech:  Normal Rate  Volume:  Normal  Mood:  Euthymic  Affect:  Congruent  Thought Process:  Goal Directed  Orientation:  Full (Time, Place, and Person)  Thought Content:  Logical  Suicidal Thoughts:  No  Homicidal Thoughts:  No  Memory:  Immediate;   Good Recent;   Poor Remote;   Fair  Judgement:  Fair  Insight:  Fair  Psychomotor Activity:  Decreased  Concentration:  Concentration: Fair  Recall:  [employer] of Knowledge:  Fair  Language:  Fair  Akathisia:  No  Handed:   Right  AIMS (if indicated):     Assets:  Desire for Improvement Housing Social Support  ADL's:  Impaired  Cognition:  Impaired,  Mild  Sleep:        Treatment Plan Summary: Daily contact with patient to assess and evaluate symptoms and progress in treatment, Medication management and Plan Patient with alcohol abuse and depression.  Currently mood is much improved.  Not suicidal.  Good insight.  Seems motivated for the future.  Does not even qualify right now for major depression.  By her report benefited from Effexor in the past which we can restart starting at a very low dose of 37-1/2 mg twice a day.  Patient fortunately has no history of delirium tremens and is not showing any signs of delirium right now.  No need for any specific alcohol withdrawal protocol.  We talked about cirrhosis and what it means for her long-term prognosis and how crucial it would be to stop drinking.  Patient indicates that she has some motivation for that.  No other change to specific treatment right now I will continue to follow while she was in the hospital.  Disposition: No evidence of imminent risk to self or others at present.   Patient does not meet criteria for psychiatric inpatient admission. Supportive therapy provided about ongoing stressors. Discussed crisis plan, support from social network, calling 911, coming to the Emergency Department, and calling Suicide Hotline.  Alethia Berthold, MD 10/26/2018 5:31 PM

## 2018-10-26 NOTE — Care Management Note (Signed)
Case Management Note  Patient Details  Name: [name] MRN: [id number] Date of Birth: March 24, 1956  Subjective/Objective:   RNCM to see patient for assessment.  Patient is talking with her husband on the phone and requests that CM come back at a later time.  RNCM consult placed for medication assistance for apixiban, pt has no insurance.  RNCM will revisit this afternoon.                 Doran Clay RN BSN [phone]  Action/Plan:   Expected Discharge Date:                  Expected Discharge Plan:     In-House Referral:     Discharge planning Services  CM Consult  Post Acute Care Choice:    Choice offered to:     DME Arranged:    DME Agency:     HH Arranged:    HH Agency:     Status of Service:  In process, will continue to follow  If discussed at Long Length of Stay Meetings, dates discussed:    Additional Comments:  Shelbie Hutching, RN 10/26/2018, 12:05 PM

## 2018-10-26 NOTE — Consult Note (Addendum)
Pharmacy Electrolyte Monitoring Consult:  Pharmacy consulted to assist in monitoring and replacing electrolytes in this 62 y.o. female admitted on 10/24/2018 with Weakness   Labs:  Sodium (mmol/L)  Date Value  10/26/2018 128 (L)   Potassium (mmol/L)  Date Value  10/26/2018 4.1   Magnesium (mg/dL)  Date Value  10/26/2018 2.2   Phosphorus (mg/dL)  Date Value  10/26/2018 2.3 (L)   Calcium (mg/dL)  Date Value  10/26/2018 6.8 (L)   Albumin (g/dL)  Date Value  10/25/2018 1.9 (L)    Assessment/Plan: Goals: Potassium ~4.0, magnesium ~2.0  Ordered KCl 10 mEq x 4 this AM. Hyponatremic, patient is also receiving NS with KCl 40 mEq @ 100 mL/hr. No need for further replacement.  Electrolytes have been ordered with AM labs.  Pharmacy will continue to monitor.  Paticia Stack, PharmD Pharmacy Resident  10/26/2018 11:14 AM

## 2018-10-26 NOTE — Progress Notes (Signed)
Patient went off of the unit via transportation to paracentesis procedure.

## 2018-10-27 LAB — CBC
HCT: 26.7 % — ABNORMAL LOW (ref 36.0–46.0)
Hemoglobin: 8.9 g/dL — ABNORMAL LOW (ref 12.0–15.0)
MCH: 33.8 pg (ref 26.0–34.0)
MCHC: 33.3 g/dL (ref 30.0–36.0)
MCV: 101.5 fL — ABNORMAL HIGH (ref 80.0–100.0)
NRBC: 0 % (ref 0.0–0.2)
PLATELETS: 193 10*3/uL (ref 150–400)
RBC: 2.63 MIL/uL — ABNORMAL LOW (ref 3.87–5.11)
RDW: 13 % (ref 11.5–15.5)
WBC: 8.5 10*3/uL (ref 4.0–10.5)

## 2018-10-27 LAB — BASIC METABOLIC PANEL
ANION GAP: 4 — AB (ref 5–15)
BUN: 12 mg/dL (ref 8–23)
CO2: 20 mmol/L — ABNORMAL LOW (ref 22–32)
Calcium: 6.3 mg/dL — CL (ref 8.9–10.3)
Chloride: 107 mmol/L (ref 98–111)
Creatinine, Ser: 0.69 mg/dL (ref 0.44–1.00)
GFR calc Af Amer: >60 mL/min (ref >60)
GLUCOSE: 105 mg/dL — AB (ref 70–99)
Potassium: 3.1 mmol/L — ABNORMAL LOW (ref 3.5–5.1)
SODIUM: 131 mmol/L — AB (ref 135–145)

## 2018-10-27 LAB — POTASSIUM: POTASSIUM: 4.6 mmol/L (ref 3.5–5.1)

## 2018-10-27 LAB — PROTEIN, BODY FLUID (OTHER): TOTAL PROTEIN, BODY FLUID OTHER: 0.8 g/dL

## 2018-10-27 LAB — C DIFFICILE QUICK SCREEN W PCR REFLEX
C DIFFICILE (CDIFF) INTERP: NOT DETECTED
C DIFFICLE (CDIFF) ANTIGEN: NEGATIVE
C Diff toxin: NEGATIVE

## 2018-10-27 LAB — PH, BODY FLUID: pH, Body Fluid: 7.7

## 2018-10-27 LAB — PHOSPHORUS: Phosphorus: 1.4 mg/dL — ABNORMAL LOW (ref 2.5–4.6)

## 2018-10-27 LAB — PROCALCITONIN: PROCALCITONIN: 0.25 ng/mL

## 2018-10-27 LAB — AMMONIA: Ammonia: 28 umol/L (ref 9–35)

## 2018-10-27 LAB — MAGNESIUM: MAGNESIUM: 1.5 mg/dL — AB (ref 1.7–2.4)

## 2018-10-27 MED ORDER — MAGNESIUM SULFATE 2 GM/50ML IV SOLN
2.0000 g | Freq: Once | INTRAVENOUS | Status: AC
  Administered 2018-10-27: 2 g via INTRAVENOUS
  Filled 2018-10-27: qty 50

## 2018-10-27 MED ORDER — PRO-STAT SUGAR FREE PO LIQD
30.0000 mL | Freq: Three times a day (TID) | ORAL | Status: DC
  Administered 2018-10-27 – 2018-10-28 (×3): 30 mL via ORAL

## 2018-10-27 MED ORDER — K PHOS MONO-SOD PHOS DI & MONO 155-852-130 MG PO TABS
500.0000 mg | ORAL_TABLET | ORAL | Status: AC
  Administered 2018-10-27 – 2018-10-28 (×4): 500 mg via ORAL
  Filled 2018-10-27 (×5): qty 2

## 2018-10-27 MED ORDER — LACTULOSE 10 GM/15ML PO SOLN
20.0000 g | Freq: Two times a day (BID) | ORAL | Status: DC
  Administered 2018-10-27: 20 g via ORAL
  Filled 2018-10-27: qty 30

## 2018-10-27 MED ORDER — POTASSIUM CHLORIDE CRYS ER 20 MEQ PO TBCR
40.0000 meq | EXTENDED_RELEASE_TABLET | ORAL | Status: AC
  Administered 2018-10-27 (×2): 40 meq via ORAL
  Filled 2018-10-27 (×2): qty 2

## 2018-10-27 MED ORDER — CALCIUM CARBONATE ANTACID 500 MG PO CHEW
2.5000 | CHEWABLE_TABLET | Freq: Three times a day (TID) | ORAL | Status: DC
  Administered 2018-10-27 – 2018-10-28 (×2): 500 mg via ORAL
  Filled 2018-10-27 (×3): qty 3

## 2018-10-27 MED ORDER — SODIUM CHLORIDE 0.9 % IV SOLN
Freq: Once | INTRAVENOUS | Status: DC
  Filled 2018-10-27: qty 10

## 2018-10-27 MED ORDER — CALCIUM CARBONATE 1250 (500 CA) MG PO TABS
1.0000 | ORAL_TABLET | Freq: Three times a day (TID) | ORAL | Status: DC
  Filled 2018-10-27: qty 1

## 2018-10-27 NOTE — Consult Note (Signed)
[facility] NOTE  Pharmacy Consult for Amiodarone DDI Indication: atrial fibrillation   No Known Allergies   Medications:  Scheduled:  . amiodarone  200 mg Oral Daily  . apixaban  5 mg Oral BID  . famotidine  20 mg Oral BID  . feeding supplement (PRO-STAT SUGAR FREE 64)  30 mL Oral TID WC  . folic acid  1 mg Oral Daily  . lactulose  20 g Oral BID  . midodrine  5 mg Oral TID WC  . multivitamin with minerals  1 tablet Oral Daily  . sodium chloride flush  10-40 mL Intracatheter Q12H  . thiamine  100 mg Oral Daily  . venlafaxine  37.5 mg Oral BID WC    Assessment: Level D interactions with: Ondansetron: Additional potential for QTc prolongation. EKG on 11/5 QTc 467. Received one dose IV 11/5. PRN ondansetron active - no doses received.   Pharmacy will continue to monitor.  Oswald Hillock, PharmD Clinical Pharmacist  10/27/2018 11:28 AM

## 2018-10-27 NOTE — Progress Notes (Signed)
Pt request to crushed her pills tonight. Will continue to monitor.

## 2018-10-27 NOTE — Consult Note (Signed)
[facility] Consult   Reason for Consult: Consult follow-up for this patient with alcohol abuse recovering from sepsis and now being treated as well for her symptomatic cirrhosis with Referring Physician: Anselm Jungling Patient Identification: [name] MRN:  [id number] Principal Diagnosis: Sepsis [facility]) Diagnosis:   Patient Active Problem List   Diagnosis Date Noted  . Dysthymia [F34.1] 10/26/2018  . Sepsis ([place]) [A41.9] 10/25/2018  . ETOH abuse [F10.10] 10/25/2018  . A-fib ([place]) [I48.91] 11/26/2017  . Malnutrition of moderate degree [E44.0] 09/27/2016  . Acute pancreatitis [K85.90] 09/26/2016    Total Time spent with patient: 30 minutes  Subjective:   [name] is a 62 y.o. female patient admitted with "I guess I am doing okay".  HPI: Patient seen chart reviewed.  See yesterday's note.  This is a patient with a history of chronic heavy alcohol abuse who came into the hospital septic.  She recovered from that but is still extremely weak and debilitated and having recurrent ascites.  On interview today the patient denies feeling depressed.  Denies any acute anxiety.  Denies having any psychotic symptoms.  Not suicidal or homicidal.  Had a conversation with her for a while and which I noticed that she is mostly able to stay on topic but there are several times that she will lose the thread of the conversation or answer with something inappropriate or become confused in the middle of his story and not be able to correct herself.  Patient is aware that it has been recommended to her that she go to rehab.  She told me that she thought she would agree to it but I know that in the chart she has been ambivalent at times because of the cost and concern about going back to her husband.  Patient acknowledges that her living situation in the motel was very unsafe.  She acknowledged understanding that if she continued to drink her symptoms would rapidly worsen and that it would hasten her  death.  Past Psychiatric History: Long history of alcohol abuse.  By her report she has had extended periods of sobriety but mostly it sounds like that is been in the more distant past.  Risk to Self:   Risk to Others:   Prior Inpatient Therapy:   Prior Outpatient Therapy:    Past Medical History:  Past Medical History:  Diagnosis Date  . Cancer ([place])    skin  . Depression   . ETOH abuse     Past Surgical History:  Procedure Laterality Date  . MOLE REMOVAL     Family History:  Family History  Problem Relation Age of Onset  . Breast cancer Neg Hx    Family Psychiatric  History: None known Social History:  Social History   Substance and Sexual Activity  Alcohol Use Yes  . Alcohol/week: 1.0 standard drinks  . Types: 1 Shots of liquor per week     Social History   Substance and Sexual Activity  Drug Use No    Social History   Socioeconomic History  . Marital status: Married    Spouse name: Not on file  . Number of children: Not on file  . Years of education: Not on file  . Highest education level: Not on file  Occupational History  . Not on file  Social Needs  . Financial resource strain: Not on file  . Food insecurity:    Worry: Not on file    Inability: Not on file  . Transportation needs:  Medical: Not on file    Non-medical: Not on file  Tobacco Use  . Smoking status: Current Every Day Smoker    Packs/day: 0.25    Types: Cigarettes  . Smokeless tobacco: Never Used  Substance and Sexual Activity  . Alcohol use: Yes    Alcohol/week: 1.0 standard drinks    Types: 1 Shots of liquor per week  . Drug use: No  . Sexual activity: Never  Lifestyle  . Physical activity:    Days per week: Not on file    Minutes per session: Not on file  . Stress: Not on file  Relationships  . Social connections:    Talks on phone: Not on file    Gets together: Not on file    Attends religious service: Not on file    Active member of club or organization: Not on  file    Attends meetings of clubs or organizations: Not on file    Relationship status: Not on file  Other Topics Concern  . Not on file  Social History Narrative  . Not on file   Additional Social History:    Allergies:  No Known Allergies  Labs:  Results for orders placed or performed during the hospital encounter of 10/24/18 (from the past 48 hour(s))  APTT     Status: None   Collection Time: 10/25/18  4:38 PM  Result Value Ref Range   aPTT 36 24 - 36 seconds    Comment: Performed at [facility], [address] [place], [place] [address]  Phosphorus     Status: None   Collection Time: 10/25/18 11:45 PM  Result Value Ref Range   Phosphorus 2.9 2.5 - 4.6 mg/dL    Comment: Performed at [facility], [address] [place], [place] [address]  Basic metabolic panel     Status: Abnormal   Collection Time: 10/25/18 11:45 PM  Result Value Ref Range   Sodium 127 (L) 135 - 145 mmol/L   Potassium 3.0 (L) 3.5 - 5.1 mmol/L   Chloride 94 (L) 98 - 111 mmol/L   CO2 23 22 - 32 mmol/L   Glucose, Bld 121 (H) 70 - 99 mg/dL   BUN 21 8 - 23 mg/dL   Creatinine, Ser 0.88 0.44 - 1.00 mg/dL   Calcium 6.7 (L) 8.9 - 10.3 mg/dL   GFR calc non Af Amer >60 >60 mL/min   GFR calc Af Amer >60 >60 mL/min    Comment: (NOTE) The eGFR has been calculated using the CKD EPI equation. This calculation has not been validated in all clinical situations. eGFR's persistently <60 mL/min signify possible Chronic Kidney Disease.    Anion gap 10 5 - 15    Comment: Performed at [facility], [address] [place], [place] [address]  Magnesium     Status: Abnormal   Collection Time: 10/25/18 11:45 PM  Result Value Ref Range   Magnesium 2.6 (H) 1.7 - 2.4 mg/dL    Comment: Performed at [facility], [address] [place], [place] [address]  APTT     Status: Abnormal   Collection Time: 10/25/18 11:45 PM  Result Value Ref Range   aPTT >160 (HH) 24 - 36 seconds     Comment:        IF BASELINE aPTT IS ELEVATED, SUGGEST PATIENT RISK ASSESSMENT BE USED TO DETERMINE APPROPRIATE ANTICOAGULANT THERAPY. CRITICAL RESULT CALLED TO, READ BACK BY AND VERIFIED WITH: CHELSEA WRENN '@[id number]'  10/26/18 AKT Performed at  [facility], [address] [place], [place] [address]   Magnesium     Status: None   Collection Time: 10/26/18  6:21 AM  Result Value Ref Range   Magnesium 2.2 1.7 - 2.4 mg/dL    Comment: Performed at [facility], [address] [place], [place] [address]  Phosphorus     Status: Abnormal   Collection Time: 10/26/18  6:21 AM  Result Value Ref Range   Phosphorus 2.3 (L) 2.5 - 4.6 mg/dL    Comment: Performed at [facility], [address] [place], [place] [address]  Procalcitonin     Status: None   Collection Time: 10/26/18  6:21 AM  Result Value Ref Range   Procalcitonin 0.39 ng/mL    Comment:        Interpretation: PCT (Procalcitonin) <= 0.5 ng/mL: Systemic infection (sepsis) is not likely. Local bacterial infection is possible. (NOTE)       Sepsis PCT Algorithm           Lower Respiratory Tract                                      Infection PCT Algorithm    ----------------------------     ----------------------------         PCT < 0.25 ng/mL                PCT < 0.10 ng/mL         Strongly encourage             Strongly discourage   discontinuation of antibiotics    initiation of antibiotics    ----------------------------     -----------------------------       PCT 0.25 - 0.50 ng/mL            PCT 0.10 - 0.25 ng/mL               OR       >80% decrease in PCT            Discourage initiation of                                            antibiotics      Encourage discontinuation           of antibiotics    ----------------------------     -----------------------------         PCT >= 0.50 ng/mL              PCT 0.26 - 0.50 ng/mL               AND        <80% decrease in PCT             Encourage  initiation of                                             antibiotics       Encourage continuation           of antibiotics    ----------------------------     -----------------------------        PCT >= 0.50 ng/mL  PCT > 0.50 ng/mL               AND         increase in PCT                  Strongly encourage                                      initiation of antibiotics    Strongly encourage escalation           of antibiotics                                     -----------------------------                                           PCT <= 0.25 ng/mL                                                 OR                                        > 80% decrease in PCT                                     Discontinue / Do not initiate                                             antibiotics Performed at [facility], [address] [place], [place] [address]   Basic metabolic panel     Status: Abnormal   Collection Time: 10/26/18  6:21 AM  Result Value Ref Range   Sodium 128 (L) 135 - 145 mmol/L   Potassium 4.1 3.5 - 5.1 mmol/L   Chloride 97 (L) 98 - 111 mmol/L   CO2 23 22 - 32 mmol/L   Glucose, Bld 103 (H) 70 - 99 mg/dL   BUN 18 8 - 23 mg/dL   Creatinine, Ser 0.96 0.44 - 1.00 mg/dL   Calcium 6.8 (L) 8.9 - 10.3 mg/dL   GFR calc non Af Amer >60 >60 mL/min   GFR calc Af Amer >60 >60 mL/min    Comment: (NOTE) The eGFR has been calculated using the CKD EPI equation. This calculation has not been validated in all clinical situations. eGFR's persistently <60 mL/min signify possible Chronic Kidney Disease.    Anion gap 8 5 - 15    Comment: Performed at [facility], [address] [place], [place] [address]  CBC     Status: Abnormal   Collection Time: 10/26/18  6:21 AM  Result Value Ref Range   WBC 9.7 4.0 - 10.5 K/uL   RBC 3.12 (L) 3.87 - 5.11 MIL/uL   Hemoglobin 10.6 (L) 12.0 - 15.0 g/dL  HCT 30.5 (L) 36.0 - 46.0 %   MCV 97.8 80.0 - 100.0 fL    MCH 34.0 26.0 - 34.0 pg   MCHC 34.8 30.0 - 36.0 g/dL   RDW 12.8 11.5 - 15.5 %   Platelets 246 150 - 400 K/uL   nRBC 0.0 0.0 - 0.2 %    Comment: Performed at [facility], [address] [place], [place] [address]  APTT     Status: None   Collection Time: 10/26/18  9:16 AM  Result Value Ref Range   aPTT 32 24 - 36 seconds    Comment: Performed at [facility], [place], [place] [address]  Heparin level (unfractionated)     Status: Abnormal   Collection Time: 10/26/18  9:16 AM  Result Value Ref Range   Heparin Unfractionated <0.10 (L) 0.30 - 0.70 IU/mL    Comment: (NOTE) If heparin results are below expected values, and patient dosage has  been confirmed, suggest follow up testing of antithrombin III levels. Performed at [facility], [address] [place], [place] [address]   [place], Body Fluid     Status: None   Collection Time: 10/26/18  3:15 PM  Result Value Ref Range   pH, Body Fluid 7.7 Not Estab.    Comment: (NOTE) This test was developed and its performance characteristics determined by [facility]. It has not been cleared or approved by the Food and Drug Administration. The reference interval(s) and other method performance specifications have not been established for this body fluid. The test result must be integrated into the clinical context for interpretation. Performed At: [facility] [place], [place] [id number] Rush Farmer MD [id number]    Source of Sample PLEU     Comment: Performed at [facility], [address] [place], [place] [address]  Body fluid cell count with differential     Status: Abnormal   Collection Time: 10/26/18  3:15 PM  Result Value Ref Range   Fluid Type-FCT PLEU     Comment: CORRECTED ON 11/07 AT 1631: PREVIOUSLY REPORTED AS CYTOPERI   Color, Fluid YELLOW (A) YELLOW   Appearance, Fluid CLEAR CLEAR   WBC, Fluid 117 cu mm   Neutrophil Count, Fluid 40 %    Lymphs, Fluid 34 %   Monocyte-Macrophage-Serous Fluid 25 %   Eos, Fluid 0 %   Other Cells, Fluid 1 %    Comment: basophile Performed at [facility], [address] [place], [place] [address]   Amylase, pleural or peritoneal fluid     Status: None   Collection Time: 10/26/18  3:15 PM  Result Value Ref Range   Amylase, Fluid 10 U/L    Comment: NO NORMAL RANGE ESTABLISHED FOR THIS TEST   Fluid Type-FAMY CYTOPERI     Comment: Performed at [facility], [address] [place], [place] [address]  Albumin, pleural or peritoneal fluid     Status: None   Collection Time: 10/26/18  3:15 PM  Result Value Ref Range   Albumin, Fluid <1.0 g/dL    Comment: (NOTE) No normal range established for this test Results should be evaluated in conjunction with serum values    Fluid Type-FALB PLEU     Comment: Performed at [facility], [address] [place],  [address] CORRECTED ON 11/07 AT 4163: PREVIOUSLY REPORTED AS CYTOPERI   Protein, pleural or peritoneal fluid     Status: None   Collection Time: 10/26/18  3:15 PM  Result Value Ref Range   Total protein, fluid <3.0 g/dL    Comment: (NOTE) No normal range established for this test Results should be evaluated in conjunction with serum values    Fluid Type-FTP PLEU     Comment: Performed at [facility], [address] [place], [place] [address] CORRECTED ON 11/07 AT 1631: PREVIOUSLY REPORTED AS CYTOPERI   Protein, body fluid (other)     Status: None   Collection Time: 10/26/18  3:15 PM  Result Value Ref Range   Total Protein, Body Fluid Other 0.8 g/dL    Comment: (NOTE) ________________________________________________________ :  Peritoneal  :       Pleural          :   Synovial     : :______________:________________________:________________: :              : Transudate :  Exudate  :                : :______________:____________:___________:________________: :  Not Estab.  :   <3 g/dL   :  >3 g/dL  :    <2.5 g/dL   : :______________:____________:___________:________________: The method performance specifications have not been established for this test in body fluid. The test result should be integrated into the clinical context for interpretation. The method performance specifications have not been established for this test in body fluid.  The test result should be integrated into the clinical context for interpretation. Performed At: [facility] [place], [place] [id number] Rush Farmer MD VF:[id number]    Source of Sample PERITONEAL     Comment: Performed at [facility], [address] [place], [place] [address]  Body fluid culture     Status: None (Preliminary result)   Collection Time: 10/26/18  4:12 PM  Result Value Ref Range   Specimen Description      PERITONEAL Performed at [facility], [address] [place], [place] [address]    Special Requests      NONE Performed at [facility], [place], [place] [address]    Gram Stain      WBC PRESENT,BOTH PMN AND MONONUCLEAR NO ORGANISMS SEEN CYTOSPIN SMEAR    Culture      NO GROWTH < 12 HOURS Performed at [facility], [place] [address] [place], [place] [address]    Report Status PENDING   Ammonia     Status: None   Collection Time: 10/27/18 10:14 AM  Result Value Ref Range   Ammonia 28 9 - 35 umol/L    Comment: Performed at [facility], [address] [place], [place] [address]  CBC     Status: Abnormal   Collection Time: 10/27/18 10:14 AM  Result Value Ref Range   WBC 8.5 4.0 - 10.5 K/uL   RBC 2.63 (L) 3.87 - 5.11 MIL/uL   Hemoglobin 8.9 (L) 12.0 - 15.0 g/dL   HCT 26.7 (L) 36.0 - 46.0 %   MCV 101.5 (H) 80.0 - 100.0 fL   MCH 33.8 26.0 - 34.0 pg   MCHC 33.3 30.0 - 36.0 g/dL   RDW 13.0 11.5 - 15.5 %   Platelets 193 150 - 400 K/uL   nRBC 0.0 0.0 - 0.2 %    Comment: Performed at [facility], [address] [place], [place] [address]  Procalcitonin     Status: None   Collection Time: 10/27/18 10:24 AM  Result Value Ref Range   Procalcitonin 0.25 ng/mL    Comment:        Interpretation: PCT (Procalcitonin) <= 0.5 ng/mL: Systemic infection (sepsis) is not likely. Local bacterial infection is possible. (NOTE)       Sepsis PCT Algorithm           Lower Respiratory Tract                                      Infection PCT Algorithm    ----------------------------     ----------------------------         PCT < 0.25 ng/mL                PCT < 0.10 ng/mL         Strongly encourage             Strongly discourage   discontinuation of antibiotics    initiation of antibiotics    ----------------------------     -----------------------------       PCT 0.25 - 0.50 ng/mL            PCT 0.10 - 0.25 ng/mL               OR       >80% decrease in PCT            Discourage initiation of                                            antibiotics      Encourage discontinuation           of antibiotics    ----------------------------     -----------------------------         PCT >= 0.50 ng/mL              PCT 0.26 - 0.50 ng/mL               AND        <80% decrease in PCT             Encourage initiation of                                             antibiotics       Encourage continuation           of antibiotics    ----------------------------     -----------------------------        PCT >= 0.50 ng/mL                  PCT > 0.50 ng/mL               AND         increase in PCT                  Strongly encourage                                      initiation of antibiotics    Strongly encourage escalation  of antibiotics                                     -----------------------------                                           PCT <= 0.25 ng/mL                                                 OR                                        > 80% decrease in PCT                                      Discontinue / Do not initiate                                             antibiotics Performed at [facility], [address] [place], [place] [address]   Basic metabolic panel     Status: Abnormal   Collection Time: 10/27/18 10:24 AM  Result Value Ref Range   Sodium 131 (L) 135 - 145 mmol/L   Potassium 3.1 (L) 3.5 - 5.1 mmol/L   Chloride 107 98 - 111 mmol/L   CO2 20 (L) 22 - 32 mmol/L   Glucose, Bld 105 (H) 70 - 99 mg/dL   BUN 12 8 - 23 mg/dL   Creatinine, Ser 0.69 0.44 - 1.00 mg/dL   Calcium 6.3 (LL) 8.9 - 10.3 mg/dL    Comment: CRITICAL RESULT CALLED TO, READ BACK BY AND VERIFIED WITH [facility] AT 1123 10/27/18 DAS    GFR calc non Af Amer >60 >60 mL/min   GFR calc Af Amer >60 >60 mL/min    Comment: (NOTE) The eGFR has been calculated using the CKD EPI equation. This calculation has not been validated in all clinical situations. eGFR's persistently <60 mL/min signify possible Chronic Kidney Disease.    Anion gap 4 (L) 5 - 15    Comment: Performed at [facility], [address] [place], [place] [address]  Magnesium     Status: Abnormal   Collection Time: 10/27/18 10:24 AM  Result Value Ref Range   Magnesium 1.5 (L) 1.7 - 2.4 mg/dL    Comment: Performed at [facility], [address] [place], [place] [address]  Phosphorus     Status: Abnormal   Collection Time: 10/27/18 10:24 AM  Result Value Ref Range   Phosphorus 1.4 (L) 2.5 - 4.6 mg/dL    Comment: Performed at [facility], [address] [place], [place] [address]  C difficile quick scan w PCR reflex     Status: None   Collection Time: 10/27/18  1:24 PM  Result Value Ref Range   C Diff antigen NEGATIVE NEGATIVE   C Diff toxin  NEGATIVE NEGATIVE   C Diff interpretation No C. difficile detected.     Comment: Performed at [facility], [address] [place], [place] [address]    Current Facility-Administered Medications  Medication Dose  Route Frequency Provider Last Rate Last Dose  . acetaminophen (TYLENOL) tablet 650 mg  650 mg Oral Q6H PRN Lance Coon, MD       Or  . acetaminophen (TYLENOL) suppository 650 mg  650 mg Rectal Q6H PRN Lance Coon, MD      . amiodarone (PACERONE) tablet 200 mg  200 mg Oral Daily Corey Skains, MD   200 mg at 10/27/18 0810  . apixaban (ELIQUIS) tablet 5 mg  5 mg Oral BID Paticia Stack, RPH   5 mg at 10/27/18 0810  . calcium carbonate (TUMS - dosed in mg elemental calcium) chewable tablet 500 mg of elemental calcium  2.5 tablet Oral TID WC Vaughan Basta, MD      . cefTRIAXone (ROCEPHIN) 2 g in sodium chloride 0.9 % 100 mL IVPB  2 g Intravenous Q24H Conforti, Toneisha Savary, DO 200 mL/hr at 10/27/18 1117 2 g at 10/27/18 1117  . famotidine (PEPCID) tablet 20 mg  20 mg Oral BID Conforti, Jojo Geving, DO   20 mg at 10/27/18 0810  . feeding supplement (PRO-STAT SUGAR FREE 64) liquid 30 mL  30 mL Oral TID WC Vaughan Basta, MD   30 mL at 10/27/18 1118  . folic acid (FOLVITE) tablet 1 mg  1 mg Oral Daily Conforti, Kaylee Wombles, DO   1 mg at 10/27/18 0811  . LORazepam (ATIVAN) tablet 1 mg  1 mg Oral Q6H PRN Conforti, Tamsin Nader, DO       Or  . LORazepam (ATIVAN) injection 1 mg  1 mg Intravenous Q6H PRN Conforti, Debralee Braaksma, DO      . midodrine (PROAMATINE) tablet 5 mg  5 mg Oral TID WC Dustin Flock, MD   5 mg at 10/27/18 1118  . multivitamin with minerals tablet 1 tablet  1 tablet Oral Daily Conforti, Marsela Kuan, DO   1 tablet at 10/27/18 0811  . ondansetron (ZOFRAN) tablet 4 mg  4 mg Oral Q6H PRN Lance Coon, MD      . phosphorus (K PHOS NEUTRAL) tablet 500 mg  500 mg Oral Q4H Oswald Hillock, RPH   500 mg at 10/27/18 1311  . potassium chloride SA (K-DUR,KLOR-CON) CR tablet 40 mEq  40 mEq Oral Q4H Oswald Hillock, RPH   40 mEq at 10/27/18 1310  . sodium chloride flush (NS) 0.9 % injection 10-40 mL  10-40 mL Intracatheter Q12H Vaughan Basta, MD   10 mL at 10/27/18 1118  . sodium chloride flush (NS) 0.9 %  injection 10-40 mL  10-40 mL Intracatheter PRN Vaughan Basta, MD      . thiamine (VITAMIN B-1) tablet 100 mg  100 mg Oral Daily Conforti, Merrel Crabbe, DO   100 mg at 10/27/18 0811  . venlafaxine [facility]) tablet 37.5 mg  37.5 mg Oral BID WC Marelyn Rouser, Madie Reno, MD   37.5 mg at 10/27/18 2263    Musculoskeletal: Strength & Muscle Tone: decreased Gait & Station: unable to stand Patient leans: N/A  Psychiatric Specialty Exam: Physical Exam  Nursing note and vitals reviewed. Constitutional: She appears well-developed and well-nourished.  HENT:  Head: Normocephalic and atraumatic.  Eyes: Pupils are equal, round, and reactive to light. Conjunctivae are normal.  Neck: Normal range of motion.  Cardiovascular: Regular rhythm and normal heart sounds.  Respiratory: Effort normal.  No respiratory distress.  GI: Soft.  Musculoskeletal: Normal range of motion.  Neurological: She is alert.  Skin: Skin is warm and dry.  Psychiatric: Her affect is blunt. Her speech is delayed and tangential. She is slowed. Thought content is not paranoid and not delusional. She expresses no homicidal and no suicidal ideation. She exhibits abnormal recent memory.    Review of Systems  Constitutional: Positive for malaise/fatigue and weight loss.  HENT: Negative.   Eyes: Negative.   Respiratory: Negative.   Cardiovascular: Negative.   Gastrointestinal: Negative.   Musculoskeletal: Negative.   Skin: Negative.   Neurological: Positive for focal weakness.  Psychiatric/Behavioral: Positive for memory loss and substance abuse. Negative for depression, hallucinations and suicidal ideas. The patient is not nervous/anxious and does not have insomnia.     Blood pressure 94/67, pulse 92, temperature 97.8 F (36.6 C), temperature source Oral, resp. rate 18, height '5\' 9"'  (1.753 m), weight 70.3 kg, SpO2 99 %.Body mass index is 22.87 kg/m.  General Appearance: Disheveled  Eye Contact:  Fair  Speech:  Normal Rate  Volume:   Normal  Mood:  Euthymic  Affect:  Constricted  Thought Process:  Coherent  Orientation:  Full (Time, Place, and Person)  Thought Content:  Rumination and Tangential  Suicidal Thoughts:  No  Homicidal Thoughts:  No  Memory:  Immediate;   Fair Recent;   Poor Remote;   Fair  Judgement:  Fair  Insight:  Fair  Psychomotor Activity:  Decreased  Concentration:  Concentration: Fair  Recall:  [employer] of Knowledge:  Fair  Language:  Fair  Akathisia:  No  Handed:  Right  AIMS (if indicated):     Assets:  Desire for Improvement  ADL's:  Impaired  Cognition:  Impaired,  Mild  Sleep:        Treatment Plan Summary: Plan This is a patient with alcohol abuse and liver damage.  She is not having delirium tremens.  She does not describe a major depression or other psychiatric syndrome that would benefit from medication.  I think there does remain a question as to whether she has full capacity to make decisions for herself.  On the one hand she shows evidence of intermittent confusion and certainly if she were to start drinking again her mental status would probably rapidly decline.  On the other hand in conversation she was able to acknowledge that her living situation was unsafe.  She certainly remembers the details of what her living situation was like and understands the difference between that and going to a rehab facility.  Patient does understand the severity of her illness I think.  Therefore I would come down on saying that for now she remains capable of making the decisions about her living situation.  I will sign this out to the doctors over the weekend as well.  The  Disposition: No evidence of imminent risk to self or others at present.   Patient does not meet criteria for psychiatric inpatient admission. Supportive therapy provided about ongoing stressors. Discussed crisis plan, support from social network, calling 911, coming to the Emergency Department, and calling Suicide  Hotline.  Alethia Berthold, MD 10/27/2018 4:05 PM

## 2018-10-27 NOTE — Progress Notes (Signed)
[facility] at [place] NAME: [name]    MR#:  [id number]  DATE OF BIRTH:  1956/07/03  SUBJECTIVE:  CHIEF COMPLAINT:   Chief Complaint  Patient presents with  . Weakness   Came with sepsis and have A fib, RVR- stable on amio drip- switch to oral. Was on vasopressors, off now.   S/p ascites tap done.  REVIEW OF SYSTEMS:  CONSTITUTIONAL: No fever, have fatigue or weakness.  EYES: No blurred or double vision.  EARS, NOSE, AND THROAT: No tinnitus or ear pain.  RESPIRATORY: No cough, shortness of breath, wheezing or hemoptysis.  CARDIOVASCULAR: No chest pain, orthopnea, edema.  GASTROINTESTINAL: No nausea, vomiting, diarrhea or abdominal pain.  GENITOURINARY: No dysuria, hematuria.  ENDOCRINE: No polyuria, nocturia,  HEMATOLOGY: No anemia, easy bruising or bleeding SKIN: No rash or lesion. MUSCULOSKELETAL: No joint pain or arthritis.   NEUROLOGIC: No tingling, numbness, weakness.  PSYCHIATRY: No anxiety or depression.   ROS  DRUG ALLERGIES:  No Known Allergies  VITALS:  Blood pressure 96/86, pulse 81, temperature 98.2 F (36.8 C), temperature source Oral, resp. rate 18, height 5\' 9"  (1.753 m), weight 70.3 kg, SpO2 98 %.  PHYSICAL EXAMINATION:  GENERAL:  62 y.o.-year-old patient lying in the bed with no acute distress.  EYES: Pupils equal, round, reactive to light and accommodation. No scleral icterus. Extraocular muscles intact.  HEENT: Head atraumatic, normocephalic. Oropharynx and nasopharynx clear.  NECK:  Supple, no jugular venous distention. No thyroid enlargement, no tenderness.  LUNGS: Normal breath sounds bilaterally, no wheezing, rales,rhonchi or crepitation. No use of accessory muscles of respiration.  CARDIOVASCULAR: S1, S2 normal. No murmurs, rubs, or gallops.  ABDOMEN: Soft, nontender, some distended. Bowel sounds present. No organomegaly or mass.  EXTREMITIES: No pedal edema, cyanosis, or clubbing.  NEUROLOGIC:  Cranial nerves II through XII are intact. Muscle strength 4/5 in all extremities. Sensation intact. Gait not checked.  PSYCHIATRIC: The patient is alert and oriented x 2.  SKIN: No obvious rash, lesion, or ulcer.   Physical Exam LABORATORY PANEL:   CBC Recent Labs  Lab 10/27/18 1014  WBC 8.5  HGB 8.9*  HCT 26.7*  PLT 193   ------------------------------------------------------------------------------------------------------------------  Chemistries  Recent Labs  Lab 10/25/18 0519  10/27/18 1024 10/27/18 2111  NA 127*   < > 131*  --   K <2.0*   < > 3.1* 4.6  CL 89*   < > 107  --   CO2 24   < > 20*  --   GLUCOSE 160*   < > 105*  --   BUN 26*   < > 12  --   CREATININE 1.26*   < > 0.69  --   CALCIUM 6.9*   < > 6.3*  --   MG  --    < > 1.5*  --   AST 46*  --   --   --   ALT 17  --   --   --   ALKPHOS 110  --   --   --   BILITOT 1.8*  --   --   --    < > = values in this interval not displayed.   ------------------------------------------------------------------------------------------------------------------  Cardiac Enzymes Recent Labs  Lab 10/24/18 1700  TROPONINI <0.03   ------------------------------------------------------------------------------------------------------------------  RADIOLOGY:  US Paracentesis  Result Date: 10/26/2018 INDICATION: History of cirrhosis now with symptomatic ascites. Please perform ultrasound-guided paracentesis for diagnostic therapeutic purposes. EXAM: ULTRASOUND-GUIDED PARACENTESIS COMPARISON:  Chest CT-08/26/2018; right upper quadrant abdominal ultrasound-10/26/2019 MEDICATIONS: None. COMPLICATIONS: None immediate. TECHNIQUE: Informed written consent was obtained from the patient after a discussion of the risks, benefits and alternatives to treatment. A timeout was performed prior to the initiation of the procedure. Initial ultrasound scanning demonstrates a moderate to large amount of ascites within the right lower abdominal  quadrant. The right lower abdomen was prepped and draped in the usual sterile fashion. 1% lidocaine with epinephrine was used for local anesthesia. An ultrasound image was saved for documentation purposed. An 8 Fr Safe-T-Centesis catheter was introduced. The paracentesis was performed. As patient was noted to be hypotensive, the paracentesis was stopped following the removal of 3.6 L despite the presence of residual intra-abdominal ascites. The catheter was removed and a dressing was applied. The patient tolerated the procedure well without immediate post procedural complication. FINDINGS: A total of approximately 3.6 liters of serous fluid was removed. IMPRESSION: Successful ultrasound-guided paracentesis yielding 3.6 liters of peritoneal fluid. Electronically Signed   By: Sandi Mariscal M.D.   On: 10/26/2018 16:19    ASSESSMENT AND PLAN:   Principal Problem:   Sepsis ([place]) Active Problems:   A-fib ([place])   ETOH abuse   Dysthymia  * Septic shock.  Patient is empirically on Rocephin    Has been weaned off of pressors and is clinically improved Ur cx growing lactobaccilus species.  * Atrial fibrillation.  Controlled on IV amiodarone  switch to p.o. on anticoagulation  Appreciated cardio help.  * Ascites. Due to chronic alcoholism and possible liver cirrhosis  Done diagnostic paracentesis.  * Hyponatremia. slowly increase  * Anemia no evidence of active bleeding  * Alcohol abuse   Called psych, no withdrawal symptoms.  * altered mental status- check ammonia, give  Lactulose  All the records are reviewed and case discussed with Care Management/Social Workerr. Management plans discussed with the patient, family and they are in agreement.  CODE STATUS: Full.  TOTAL TIME TAKING CARE OF THIS PATIENT: 35 minutes.    POSSIBLE D/C IN 1-2 DAYS, DEPENDING ON CLINICAL CONDITION.   Vaughan Basta M.D on 10/27/2018   Between 7am to 6pm - Pager - [phone]  After 6pm go to  [email or web address] - password [email or web address] [facility]  Office  [phone]  CC: Primary care physician; [place]  Note: This dictation was prepared with Dragon dictation along with smaller phrase technology. Any transcriptional errors that result from this process are unintentional.

## 2018-10-27 NOTE — Plan of Care (Signed)
Patient confused intermittently, unable to get OOB, incontinent or urine and stool, poor appetite. No c/o pain. Electrolytes abnormal, being replaced. Will continue to monitor.   Problem: Health Behavior/Discharge Planning: Goal: Ability to manage health-related needs will improve Outcome: Not Progressing   Problem: Clinical Measurements: Goal: Ability to maintain clinical measurements within normal limits will improve Outcome: Not Progressing   Problem: Activity: Goal: Risk for activity intolerance will decrease Outcome: Not Progressing   Problem: Nutrition: Goal: Adequate nutrition will be maintained Outcome: Not Progressing   Problem: Pain Managment: Goal: General experience of comfort will improve Outcome: Progressing   Problem: Safety: Goal: Ability to remain free from injury will improve Outcome: Progressing   Problem: Respiratory: Goal: Ability to maintain adequate ventilation will improve Outcome: Progressing

## 2018-10-27 NOTE — NC FL2 (Signed)
[place] LEVEL OF CARE SCREENING TOOL     IDENTIFICATION  Patient Name: [name] Birthdate: 24-Sep-1956 Sex: female Admission Date (Current Location): 10/24/2018  [place] and [place] Number:  Engineering geologist and Address:  [facility] [address] [place], [place] [address]      Provider Number: [id number]  Attending Physician Name and Address:  Vaughan Basta, *  Relative Name and Phone Number:  Crystol Walpole- Husband [phone]    Current Level of Care: Hospital Recommended Level of Care: [place] Prior Approval Number:    Date Approved/Denied:   PASRR Number: [id number]  Discharge Plan: SNF    Current Diagnoses: Patient Active Problem List   Diagnosis Date Noted  . Dysthymia 10/26/2018  . Sepsis ([place]) 10/25/2018  . ETOH abuse 10/25/2018  . A-fib ([place]) 11/26/2017  . Malnutrition of moderate degree 09/27/2016  . Acute pancreatitis 09/26/2016    Orientation RESPIRATION BLADDER Height & Weight     Self, Place  Normal Incontinent Weight: 154 lb 14.4 oz (70.3 kg) Height:  5\' 9"  (175.3 cm)  BEHAVIORAL SYMPTOMS/MOOD NEUROLOGICAL BOWEL NUTRITION STATUS  (none) (none) Incontinent Diet(Heart Healthy )  AMBULATORY STATUS COMMUNICATION OF NEEDS Skin   Extensive Assist Verbally Normal                       Personal Care Assistance Level of Assistance  Bathing, Feeding, Dressing Bathing Assistance: Limited assistance Feeding assistance: Independent Dressing Assistance: Limited assistance     Functional Limitations Info  Sight, Hearing, Speech Sight Info: Adequate Hearing Info: Adequate Speech Info: Adequate    SPECIAL CARE FACTORS FREQUENCY  PT (By licensed PT), OT (By licensed OT)     PT Frequency: 5 OT Frequency: 5            Contractures Contractures Info: Not present    Additional Factors Info  Code Status, Allergies Code Status Info: Full Code  Allergies Info:  NKA           Current Medications (10/27/2018):  This is the current hospital active medication list Current Facility-Administered Medications  Medication Dose Route Frequency Provider Last Rate Last Dose  . acetaminophen (TYLENOL) tablet 650 mg  650 mg Oral Q6H PRN Lance Coon, MD       Or  . acetaminophen (TYLENOL) suppository 650 mg  650 mg Rectal Q6H PRN Lance Coon, MD      . amiodarone (PACERONE) tablet 200 mg  200 mg Oral Daily Corey Skains, MD   200 mg at 10/27/18 0810  . apixaban (ELIQUIS) tablet 5 mg  5 mg Oral BID Paticia Stack, RPH   5 mg at 10/27/18 0810  . calcium carbonate (TUMS - dosed in mg elemental calcium) chewable tablet 500 mg of elemental calcium  2.5 tablet Oral TID WC Vaughan Basta, MD      . cefTRIAXone (ROCEPHIN) 2 g in sodium chloride 0.9 % 100 mL IVPB  2 g Intravenous Q24H Conforti, John, DO 200 mL/hr at 10/27/18 1117 2 g at 10/27/18 1117  . famotidine (PEPCID) tablet 20 mg  20 mg Oral BID Conforti, John, DO   20 mg at 10/27/18 0810  . feeding supplement (PRO-STAT SUGAR FREE 64) liquid 30 mL  30 mL Oral TID WC Vaughan Basta, MD   30 mL at 10/27/18 1118  . folic acid (FOLVITE) tablet 1 mg  1 mg Oral Daily Conforti, John, DO   1 mg at 10/27/18 0811  .  LORazepam (ATIVAN) tablet 1 mg  1 mg Oral Q6H PRN Conforti, John, DO       Or  . LORazepam (ATIVAN) injection 1 mg  1 mg Intravenous Q6H PRN Conforti, John, DO      . midodrine (PROAMATINE) tablet 5 mg  5 mg Oral TID WC Dustin Flock, MD   5 mg at 10/27/18 1118  . multivitamin with minerals tablet 1 tablet  1 tablet Oral Daily Conforti, John, DO   1 tablet at 10/27/18 0811  . ondansetron (ZOFRAN) tablet 4 mg  4 mg Oral Q6H PRN Lance Coon, MD      . phosphorus (K PHOS NEUTRAL) tablet 500 mg  500 mg Oral Q4H Oswald Hillock, RPH   500 mg at 10/27/18 1311  . potassium chloride SA (K-DUR,KLOR-CON) CR tablet 40 mEq  40 mEq Oral Q4H Oswald Hillock, RPH   40 mEq at 10/27/18 1310  .  sodium chloride flush (NS) 0.9 % injection 10-40 mL  10-40 mL Intracatheter Q12H Vaughan Basta, MD   10 mL at 10/27/18 1118  . sodium chloride flush (NS) 0.9 % injection 10-40 mL  10-40 mL Intracatheter PRN Vaughan Basta, MD      . thiamine (VITAMIN B-1) tablet 100 mg  100 mg Oral Daily Conforti, John, DO   100 mg at 10/27/18 0811  . venlafaxine [facility]) tablet 37.5 mg  37.5 mg Oral BID WC Clapacs, Madie Reno, MD   37.5 mg at 10/27/18 1840     Discharge Medications: Please see discharge summary for a list of discharge medications.  Relevant Imaging Results:  Relevant Lab Results:   Additional Information SSN: [id number]  Annamaria Boots, [place]

## 2018-10-27 NOTE — Progress Notes (Signed)
[place] at [place] NAME: [name]    MR#:  [id number]  DATE OF BIRTH:  1956/09/06  SUBJECTIVE:  CHIEF COMPLAINT:   Chief Complaint  Patient presents with  . Weakness   Came with sepsis and have A fib, RVR- stable on amio drip- switch to oral. Was on vasopressors, off now.   Plan for ascites tap also.  REVIEW OF SYSTEMS:  CONSTITUTIONAL: No fever, have fatigue or weakness.  EYES: No blurred or double vision.  EARS, NOSE, AND THROAT: No tinnitus or ear pain.  RESPIRATORY: No cough, shortness of breath, wheezing or hemoptysis.  CARDIOVASCULAR: No chest pain, orthopnea, edema.  GASTROINTESTINAL: No nausea, vomiting, diarrhea or abdominal pain.  GENITOURINARY: No dysuria, hematuria.  ENDOCRINE: No polyuria, nocturia,  HEMATOLOGY: No anemia, easy bruising or bleeding SKIN: No rash or lesion. MUSCULOSKELETAL: No joint pain or arthritis.   NEUROLOGIC: No tingling, numbness, weakness.  PSYCHIATRY: No anxiety or depression.   ROS  DRUG ALLERGIES:  No Known Allergies  VITALS:  Blood pressure 94/67, pulse 92, temperature 97.8 F (36.6 C), temperature source Oral, resp. rate 18, height 5\' 9"  (1.753 m), weight 70.3 kg, SpO2 99 %.  PHYSICAL EXAMINATION:  GENERAL:  62 y.o.-year-old patient lying in the bed with no acute distress.  EYES: Pupils equal, round, reactive to light and accommodation. No scleral icterus. Extraocular muscles intact.  HEENT: Head atraumatic, normocephalic. Oropharynx and nasopharynx clear.  NECK:  Supple, no jugular venous distention. No thyroid enlargement, no tenderness.  LUNGS: Normal breath sounds bilaterally, no wheezing, rales,rhonchi or crepitation. No use of accessory muscles of respiration.  CARDIOVASCULAR: S1, S2 normal. No murmurs, rubs, or gallops.  ABDOMEN: Soft, nontender, nondistended. Bowel sounds present. No organomegaly or mass.  EXTREMITIES: No pedal edema, cyanosis, or clubbing.  NEUROLOGIC:  Cranial nerves II through XII are intact. Muscle strength 4/5 in all extremities. Sensation intact. Gait not checked.  PSYCHIATRIC: The patient is alert and oriented x 2.  SKIN: No obvious rash, lesion, or ulcer.   Physical Exam LABORATORY PANEL:   CBC Recent Labs  Lab 10/26/18 0621  WBC 9.7  HGB 10.6*  HCT 30.5*  PLT 246   ------------------------------------------------------------------------------------------------------------------  Chemistries  Recent Labs  Lab 10/25/18 0519  10/26/18 0621  NA 127*   < > 128*  K <2.0*   < > 4.1  CL 89*   < > 97*  CO2 24   < > 23  GLUCOSE 160*   < > 103*  BUN 26*   < > 18  CREATININE 1.26*   < > 0.96  CALCIUM 6.9*   < > 6.8*  MG  --    < > 2.2  AST 46*  --   --   ALT 17  --   --   ALKPHOS 110  --   --   BILITOT 1.8*  --   --    < > = values in this interval not displayed.   ------------------------------------------------------------------------------------------------------------------  Cardiac Enzymes Recent Labs  Lab 10/24/18 1700  TROPONINI <0.03   ------------------------------------------------------------------------------------------------------------------  RADIOLOGY:  Dg Abd 1 View  Result Date: 10/25/2018 CLINICAL DATA:  Abdominal distension. EXAM: ABDOMEN - 1 VIEW COMPARISON:  Abdominal radiograph November 27, 2017 FINDINGS: The bowel gas pattern is normal. No radio-opaque calculi or other significant radiographic abnormality are seen. Phleboliths project in the pelvis. Osteopenia. IMPRESSION: Normal bowel gas pattern. Electronically Signed   By: Elon Alas M.D.   On: 10/25/2018  16:30   US Renal  Result Date: 10/25/2018 CLINICAL DATA:  Acute renal failure. EXAM: RENAL / URINARY TRACT ULTRASOUND COMPLETE COMPARISON:  CT abdomen pelvis dated September 26, 2016. FINDINGS: Right Kidney: Renal measurements: 10.0 x 5.9 x 5.4 cm = volume: 146 mL . Echogenicity within normal limits. No mass or hydronephrosis  visualized. Two simple cysts are identified at the upper pole, the largest measuring 1.9 cm. Left Kidney: Renal measurements: 10.8 x 4.5 x 5.9 cm = volume: 150 mL. Echogenicity within normal limits. No mass or hydronephrosis visualized. Bladder: [place], layering debris within the bladder. Incidental note is made of moderate ascites. IMPRESSION: 1. No acute abnormality. 2. Layering debris within the bladder.  Correlate with urinalysis. 3. Ascites. Electronically Signed   By: Titus Dubin M.D.   On: 10/25/2018 10:25   US Paracentesis  Result Date: 10/26/2018 INDICATION: History of cirrhosis now with symptomatic ascites. Please perform ultrasound-guided paracentesis for diagnostic therapeutic purposes. EXAM: ULTRASOUND-GUIDED PARACENTESIS COMPARISON:  Chest CT-08/26/2018; right upper quadrant abdominal ultrasound-10/26/2019 MEDICATIONS: None. COMPLICATIONS: None immediate. TECHNIQUE: Informed written consent was obtained from the patient after a discussion of the risks, benefits and alternatives to treatment. A timeout was performed prior to the initiation of the procedure. Initial ultrasound scanning demonstrates a moderate to large amount of ascites within the right lower abdominal quadrant. The right lower abdomen was prepped and draped in the usual sterile fashion. 1% lidocaine with epinephrine was used for local anesthesia. An ultrasound image was saved for documentation purposed. An 8 Fr Safe-T-Centesis catheter was introduced. The paracentesis was performed. As patient was noted to be hypotensive, the paracentesis was stopped following the removal of 3.6 L despite the presence of residual intra-abdominal ascites. The catheter was removed and a dressing was applied. The patient tolerated the procedure well without immediate post procedural complication. FINDINGS: A total of approximately 3.6 liters of serous fluid was removed. IMPRESSION: Successful ultrasound-guided paracentesis yielding 3.6 liters of  peritoneal fluid. Electronically Signed   By: Sandi Mariscal M.D.   On: 10/26/2018 16:19    ASSESSMENT AND PLAN:   Principal Problem:   Sepsis ([place]) Active Problems:   A-fib ([place])   ETOH abuse   Dysthymia  * Septic shock.  Patient is empirically on Rocephin    Has been weaned off of pressors and is clinically improved Ur cx growing lactobaccilus species.  * Atrial fibrillation.  Controlled on IV amiodarone  switch to p.o. on anticoagulation systemic Appreciated cardio help.  * Ascites.  Patient with ascites on renal ultrasound yesterday, obtaining diagnostic paracentesis  * Hyponatremia. slowly increase  * Anemia no evidence of active bleeding  * Alcohol abuse   Called psych, no withdrawal symptoms.  All the records are reviewed and case discussed with Care Management/Social Workerr. Management plans discussed with the patient, family and they are in agreement.  CODE STATUS: Full.  TOTAL TIME TAKING CARE OF THIS PATIENT: 35 minutes.     POSSIBLE D/C IN 1-2 DAYS, DEPENDING ON CLINICAL CONDITION.   Vaughan Basta M.D on 10/27/2018   Between 7am to 6pm - Pager - [phone]  After 6pm go to [email or web address] - password [email or web address] [place] [facility]  Office  [phone]  CC: Primary care physician; [place]  Note: This dictation was prepared with Dragon dictation along with smaller phrase technology. Any transcriptional errors that result from this process are unintentional.

## 2018-10-27 NOTE — Clinical Social Work Note (Signed)
Clinical Social Work Assessment  Patient Details  Name: [name] MRN: [id number] Date of Birth: [birth year]  Date of referral:  10/27/18               Reason for consult:  Facility Placement                Permission sought to share information with:  Case Manager, Customer service manager, Family Supports Permission granted to share information::  Yes, Verbal Permission Granted  Name::      SNF  Agency::   [facility]  Relationship::     Contact Information:     Housing/Transportation Living arrangements for the past 2 months:  [place] of Information:  Patient Patient Interpreter Needed:  None Criminal Activity/Legal Involvement Pertinent to Current Situation/Hospitalization:  No - Comment as needed Significant Relationships:  Spouse Lives with:  Spouse Do you feel safe going back to the place where you live?  Yes Need for family participation in patient care:  Yes (Comment)  Care giving concerns:  Patient lives with her husband in affordable housing    Social Worker assessment / plan:  CSW consulted for SNF. CSW met with patient to discuss discharge plan. Patient states that she lives with her husband in [place]. Patient does not have any insurance. CSW explained that PT has recommended SNF. Patient is willing to go to SNF. CSW explained that since she has no insurance she would have to sign over her social security check for 30 days and stay in a facility for 30 days. CSW also explained that patient could potentially have to go out of the county due to availability. Patient states that she is not sure and would like to talk to her husband before making a decision. Patient gave CSW permission to do local bed search. CSW will initiate bed search and give offers once received. CSW will continue to follow for discharge planning.   Employment status:  Disabled (Comment on whether or not currently receiving Disability) Insurance information:  Self Pay  (Medicaid Pending) PT Recommendations:  [place] / Referral to community resources:  [place]  Patient/Family's Response to care:  Patient thanked CSW for assistance   Patient/Family's Understanding of and Emotional Response to Diagnosis, Current Treatment, and Prognosis:  Patient does not seem to fully understand her current diagnoses   Emotional Assessment Appearance:  Appears stated age Attitude/Demeanor/Rapport:  Guarded Affect (typically observed):  Restless Orientation:  Oriented to Self, Oriented to Place Alcohol / Substance use:  Not Applicable Psych involvement (Current and /or in the community):  No (Comment)  Discharge Needs  Concerns to be addressed:  Discharge Planning Concerns Readmission within the last 30 days:  No Current discharge risk:  Cognitively Impaired, Dependent with Mobility Barriers to Discharge:  Continued Medical Work up   [employer], [place] 10/27/2018, 2:36 PM

## 2018-10-27 NOTE — Progress Notes (Signed)
PHARMACY CONSULT NOTE - FOLLOW UP  Pharmacy Consult for Electrolyte Monitoring and Replacement   Recent Labs: Potassium (mmol/L)  Date Value  10/27/2018 3.1 (L)   Magnesium (mg/dL)  Date Value  10/27/2018 1.5 (L)   Calcium (mg/dL)  Date Value  10/27/2018 6.3 (LL)   Albumin (g/dL)  Date Value  10/25/2018 1.9 (L)   Phosphorus (mg/dL)  Date Value  10/27/2018 1.4 (L)  ]   Assessment: Patient's electrolytes are below range goal. Pt has Afib and will aim for a higher goal for K+ and Mg. Pt is also on amiodarone which can effect K+ and Mg levels.  K+ 3.1, Mg 1.5, Corrected Ca: 8, Phos 1.4   Goal of Therapy:  K+: 4- 5, Mg > 2, Ca: 9-11, Phos 2.5-4.6  Plan:  Potassium: Will give KCl 40 mEq  q4H x 2 and recheck KCl level at 2200.  Magnesium will give Mg sulfate IV 2 g x 1 and recheck level with AM labs  Calcium: Will Calcium carbonate tablet 1500 mg/day and recheck with AM labs  Phosphorus: Will potassium phosphate neutral tablet 2 tabs q4H x 4 and recheck level with AM labs.   Oswald Hillock ,PharmD Clinical Pharmacist 10/27/2018 11:50 AM

## 2018-10-27 NOTE — Care Management Note (Addendum)
Case Management Note  Patient Details  Name: [name] MRN: [id number] Date of Birth: 1956/02/28  Subjective/Objective:    RNCM to discuss SNF recommendation.  Patient does not have insurance.  Patient would like SNF placement but needs to check with husband as they live together in an extended stay hotel.  They both live off their combined social security.  Notified patient that she would need to sign over her social security check and stay at least 30 days.  [place] of the [place].   Patient has been becoming more confused this morning.  She said she can talk to husband tomorrow about SNF decision.  Notified [name], CSW of possible SNF placement.      Eliquis coupon for 30 days free given.              Action/Plan:   Expected Discharge Date:                  Expected Discharge Plan:     In-House Referral:  Clinical Social Work  Discharge planning Services  CM Consult  Post Acute Care Choice:    Choice offered to:     DME Arranged:    DME Agency:     HH Arranged:    HH Agency:     Status of Service:  In process, will continue to follow  If discussed at Long Length of Stay Meetings, dates discussed:    Additional Comments:  Elza Rafter, RN 10/27/2018, 12:15 PM

## 2018-10-27 NOTE — Progress Notes (Signed)
PHARMACY CONSULT NOTE - FOLLOW UP  Pharmacy Consult for Electrolyte Monitoring and Replacement   Recent Labs: Potassium (mmol/L)  Date Value  10/27/2018 4.6   Magnesium (mg/dL)  Date Value  10/27/2018 1.5 (L)   Calcium (mg/dL)  Date Value  10/27/2018 6.3 (LL)   Albumin (g/dL)  Date Value  10/25/2018 1.9 (L)   Phosphorus (mg/dL)  Date Value  10/27/2018 1.4 (L)  ]   Assessment: Patient's electrolytes are below range goal. Pt has Afib and will aim for a higher goal for K+ and Mg. Pt is also on amiodarone which can effect K+ and Mg levels.  K+ 3.1, Mg 1.5, Corrected Ca: 8, Phos 1.4   Goal of Therapy:  K+: 4- 5, Mg > 2, Ca: 9-11, Phos 2.5-4.6  Plan:  11/8 2100 K+ 4.1 no replacement needed at this time, will continue to monitor, f/u w/ am labs.  Tobie Lords ,PharmD Clinical Pharmacist 10/27/2018 10:05 PM

## 2018-10-27 NOTE — Evaluation (Signed)
Physical Therapy Evaluation Patient Details Name: [name] MRN: [id number] DOB: 19-Apr-1956 Today's Date: 10/27/2018   History of Present Illness  Pt is 62 yo female admitted for sepsis, UTI, has undergone paracentesis during stay  with PMH of cancer, depression ETOH abuse, atrial fibrillation, malnutrition, pancreatitis.  Clinical Impression  Patient agreeable to PT, oriented to self and place. No complaints of pain initially at start of session, mentions HA and back pain during session. Pt reported living in an apartment like home with significant other, level entry. Pt reported an increased need for assistance for ADLs, unable to perform IADLs. When asked how long it has been since she has walked, pt reported "a while". Also reported using a RW. Pt informed pt that her significant other also needs a lot of assistance. Neighbors perform grocery shopping. Upon assessment patient demonstrated bed mobility mod Ax1. Sat EOB ~30secs, HR from 120s-140s with complaints of fatigue from patient. Unable to maintain seated position without assistance. Returned to bed and repositioned mod Ax1. Overall the patient demonstrated significant limitations in mobility, activity tolerance, endurance, strength, ROM. The patient would benefit from skilled PT to address these changes to optimize mobility, independence, and safety. Current recommendation is STR due to level of assist needed and lack of caregiver support.    Follow Up Recommendations SNF    Equipment Recommendations  Rolling walker with 5" wheels;Other (comment)(Pt reports having 3 in 1, WC, 2 canes and walker for husband at home.)    Recommendations for Other Services       Precautions / Restrictions Precautions Precautions: Fall Restrictions Weight Bearing Restrictions: No      Mobility  Bed Mobility Overal bed mobility: Needs Assistance Bed Mobility: Rolling;Sidelying to Sit;Sit to Supine Rolling: Min guard Sidelying to sit: Min  assist   Sit to supine: Mod assist   General bed mobility comments: Pt attempted supine scooting, extended time given. Able to initiate movement but unable perform without assistance. HR at EOB from 120 BPM to 140 BPM, returned to supine with HR returning to ~115 BPM.  Transfers                 General transfer comment: deferred  Ambulation/Gait                Stairs            Wheelchair Mobility    Modified Rankin (Stroke Patients Only)       Balance Overall balance assessment: Needs assistance Sitting-balance support: Single extremity supported Sitting balance-Leahy Scale: Zero                                       Pertinent Vitals/Pain Pain Assessment: Faces Pain Score: 5  Faces Pain Scale: Hurts little more Pain Location: HA, back Pain Descriptors / Indicators: Aching;Spasm Pain Intervention(s): Limited activity within patient's tolerance;Patient requesting pain meds-RN notified;Monitored during session;Repositioned    Home Living Family/patient expects to be discharged to:: Private residence Living Arrangements: Spouse/significant other Available Help at Discharge: Family Type of Home: Apartment(lives in a handicap first floor room) Home Access: Level entry     Home Layout: One [place]: Bedside commode;Cane - single point;Tub bench;Hand held shower head;Walker - 2 wheels Additional Comments: Pt reports 6-8 falls in the last 6 months/    Prior Function Level of Independence: Needs assistance         Comments: Pt reports  in the last several months she has needed significant help with ADLs, IADLs, and reports its "been awhile" since she last walked. Significant other unable to provide assistance needed.     Hand Dominance   Dominant Hand: Right    Extremity/Trunk Assessment   Upper Extremity Assessment Upper Extremity Assessment: Generalized weakness;RUE deficits/detail;LUE deficits/detail RUE Deficits  / Details: formal testing deferred due to PICC line, AROM  of RUE LUE Deficits / Details: grossly 3/5    Lower Extremity Assessment Lower Extremity Assessment: Generalized weakness;RLE deficits/detail;LLE deficits/detail RLE Deficits / Details: grossly 2+/5 LLE Deficits / Details: grossly 2+/5       Communication   Communication: No difficulties  Cognition Arousal/Alertness: Awake/alert Behavior During Therapy: WFL for tasks assessed/performed Overall Cognitive Status: Within Functional Limits for tasks assessed                                        General Comments      Exercises Other Exercises Other Exercises: Pt instructed in bed level exercises, encouraged to perform to attempt to maintain strength, ROM, improve circulation (ankle pumps, SLR, hip abduction/adduction)   Assessment/Plan    PT Assessment Patient needs continued PT services  PT Problem List Decreased strength;Decreased range of motion;Decreased activity tolerance;Decreased balance;Pain;Decreased mobility       PT Treatment Interventions DME instruction;Balance training;Gait training;Neuromuscular re-education;Stair training;Functional mobility training;Patient/family education;Therapeutic activities;Therapeutic exercise    PT Goals (Current goals can be found in the Care Plan section)  Acute Rehab PT Goals Patient Stated Goal: Pt wants to walk again PT Goal Formulation: With patient Time For Goal Achievement: 11/10/18 Potential to Achieve Goals: Fair    Frequency Min 2X/week   Barriers to discharge Decreased caregiver support Pt husband unable to provide physical assistance needed.    Co-evaluation               AM-PAC PT "6 Clicks" Daily Activity  Outcome Measure Difficulty turning over in bed (including adjusting bedclothes, sheets and blankets)?: A Lot Difficulty moving from lying on back to sitting on the side of the bed? : Unable Difficulty sitting down on and  standing up from a chair with arms (e.g., wheelchair, bedside commode, etc,.)?: Unable Help needed moving to and from a bed to chair (including a wheelchair)?: Total Help needed walking in hospital room?: Total Help needed climbing 3-5 steps with a railing? : Total 6 Click Score: 7    End of Session Equipment Utilized During Treatment: Gait belt Activity Tolerance: Patient limited by fatigue;Patient limited by pain;Treatment limited secondary to medical complications (Comment) Patient left: in bed;with call bell/phone within reach;with bed alarm set Nurse Communication: Mobility status PT Visit Diagnosis: Muscle weakness (generalized) (M62.81);History of falling (Z91.81);Other abnormalities of gait and mobility (R26.89)    Time: 3976-7341 PT Time Calculation (min) (ACUTE ONLY): 34 min   Charges:   PT Evaluation $PT Eval Moderate Complexity: 1 Mod          Lieutenant Diego PT, DPT 10:34 AM,10/27/18 [phone]

## 2018-10-28 LAB — BASIC METABOLIC PANEL
ANION GAP: 7 (ref 5–15)
BUN: 12 mg/dL (ref 8–23)
CHLORIDE: 101 mmol/L (ref 98–111)
CO2: 22 mmol/L (ref 22–32)
Calcium: 8 mg/dL — ABNORMAL LOW (ref 8.9–10.3)
Creatinine, Ser: 0.92 mg/dL (ref 0.44–1.00)
Glucose, Bld: 105 mg/dL — ABNORMAL HIGH (ref 70–99)
POTASSIUM: 4.2 mmol/L (ref 3.5–5.1)
Sodium: 130 mmol/L — ABNORMAL LOW (ref 135–145)

## 2018-10-28 LAB — MAGNESIUM: Magnesium: 2.2 mg/dL (ref 1.7–2.4)

## 2018-10-28 LAB — PHOSPHORUS: Phosphorus: 3.3 mg/dL (ref 2.5–4.6)

## 2018-10-28 MED ORDER — CEPHALEXIN 500 MG PO CAPS
500.0000 mg | ORAL_CAPSULE | Freq: Three times a day (TID) | ORAL | Status: AC
  Administered 2018-10-28 (×3): 500 mg via ORAL
  Filled 2018-10-28 (×3): qty 1

## 2018-10-28 NOTE — Consult Note (Signed)
  Amiodarone Drug - Drug Interaction Consult Note  Recommendations: No changes needed Amiodarone is metabolized by the cytochrome P450 system and therefore has the potential to cause many drug interactions. Amiodarone has an average plasma half-life of 50 days (range 20 to 100 days).   There is potential for drug interactions to occur several weeks or months after stopping treatment and the onset of drug interactions may be slow after initiating amiodarone.   []  Statins: Increased risk of myopathy. Simvastatin- restrict dose to 20mg  daily. Other statins: counsel patients to report any muscle pain or weakness immediately.  []  Anticoagulants: Amiodarone can increase anticoagulant effect. Consider warfarin dose reduction. Patients should be monitored closely and the dose of anticoagulant altered accordingly, remembering that amiodarone levels take several weeks to stabilize.  []  Antiepileptics: Amiodarone can increase plasma concentration of phenytoin, the dose should be reduced. Note that small changes in phenytoin dose can result in large changes in levels. Monitor patient and counsel on signs of toxicity.  []  Beta blockers: increased risk of bradycardia, AV block and myocardial depression. Sotalol - avoid concomitant use.  []   Calcium channel blockers (diltiazem and verapamil): increased risk of bradycardia, AV block and myocardial depression.  []   Cyclosporine: Amiodarone increases levels of cyclosporine. Reduced dose of cyclosporine is recommended.  []  Digoxin dose should be halved when amiodarone is started.  []  Diuretics: increased risk of cardiotoxicity if hypokalemia occurs.  []  Oral hypoglycemic agents (glyburide, glipizide, glimepiride): increased risk of hypoglycemia. Patient's glucose levels should be monitored closely when initiating amiodarone therapy.   []  Drugs that prolong the QT interval:  Torsades de pointes risk may be increased with concurrent use - avoid if possible.   Monitor QTc, also keep magnesium/potassium WNL if concurrent therapy can't be avoided. Marland Kitchen Antibiotics: e.g. fluoroquinolones, erythromycin. . Antiarrhythmics: e.g. quinidine, procainamide, disopyramide, sotalol. . Antipsychotics: e.g. phenothiazines, haloperidol.  . Lithium, tricyclic antidepressants, and methadone. Thank Patrick North  10/28/2018 9:35 AM

## 2018-10-28 NOTE — Plan of Care (Signed)
  Problem: Health Behavior/Discharge Planning: Goal: Ability to manage health-related needs will improve Outcome: Progressing   Problem: Clinical Measurements: Goal: Will remain free from infection Outcome: Progressing   Problem: Safety: Goal: Ability to remain free from injury will improve Outcome: Progressing

## 2018-10-28 NOTE — Progress Notes (Signed)
Patient is very confused and refused medicines.  RN asked her to take the antibiotic and after a lot of asking, patient took that pill.  Patient refused all others.  RN ordered food trays for the patient because she was unable to do so.  Phillis Knack, RN

## 2018-10-28 NOTE — Progress Notes (Signed)
[facility] [place] NAME: [name]    MR#:  [id number]  DATE OF BIRTH:  1956-04-05  SUBJECTIVE:  Patient without complaint, discussed with case management/social work-disposition planning still in process, discontinue Rocephin, start Keflex, discontinue telemetry  REVIEW OF SYSTEMS:  CONSTITUTIONAL: No fever, have fatigue or weakness.  EYES: No blurred or double vision.  EARS, NOSE, AND THROAT: No tinnitus or ear pain.  RESPIRATORY: No cough, shortness of breath, wheezing or hemoptysis.  CARDIOVASCULAR: No chest pain, orthopnea, edema.  GASTROINTESTINAL: No nausea, vomiting, diarrhea or abdominal pain.  GENITOURINARY: No dysuria, hematuria.  ENDOCRINE: No polyuria, nocturia,  HEMATOLOGY: No anemia, easy bruising or bleeding SKIN: No rash or lesion. MUSCULOSKELETAL: No joint pain or arthritis.   NEUROLOGIC: No tingling, numbness, weakness.  PSYCHIATRY: No anxiety or depression.   ROS  DRUG ALLERGIES:  No Known Allergies  VITALS:  Blood pressure 135/79, pulse (!) 103, temperature 97.6 F (36.4 C), temperature source Oral, resp. rate 19, height 5\' 9"  (1.753 m), weight 70.3 kg, SpO2 99 %.  PHYSICAL EXAMINATION:  GENERAL:  62 y.o.-year-old patient lying in the bed with no acute distress.  EYES: Pupils equal, round, reactive to light and accommodation. No scleral icterus. Extraocular muscles intact.  HEENT: Head atraumatic, normocephalic. Oropharynx and nasopharynx clear.  NECK:  Supple, no jugular venous distention. No thyroid enlargement, no tenderness.  LUNGS: Normal breath sounds bilaterally, no wheezing, rales,rhonchi or crepitation. No use of accessory muscles of respiration.  CARDIOVASCULAR: S1, S2 normal. No murmurs, rubs, or gallops.  ABDOMEN: Soft, nontender, some distended. Bowel sounds present. No organomegaly or mass.  EXTREMITIES: No pedal edema, cyanosis, or clubbing.  NEUROLOGIC: Cranial nerves II through XII are  intact. Muscle strength 4/5 in all extremities. Sensation intact. Gait not checked.  PSYCHIATRIC: The patient is alert and oriented x 2.  SKIN: No obvious rash, lesion, or ulcer.   Physical Exam LABORATORY PANEL:   CBC Recent Labs  Lab 10/27/18 1014  WBC 8.5  HGB 8.9*  HCT 26.7*  PLT 193   ------------------------------------------------------------------------------------------------------------------  Chemistries  Recent Labs  Lab 10/25/18 0519  10/28/18 0538  NA 127*   < > 130*  K <2.0*   < > 4.2  CL 89*   < > 101  CO2 24   < > 22  GLUCOSE 160*   < > 105*  BUN 26*   < > 12  CREATININE 1.26*   < > 0.92  CALCIUM 6.9*   < > 8.0*  MG  --    < > 2.2  AST 46*  --   --   ALT 17  --   --   ALKPHOS 110  --   --   BILITOT 1.8*  --   --    < > = values in this interval not displayed.   ------------------------------------------------------------------------------------------------------------------  Cardiac Enzymes Recent Labs  Lab 10/24/18 1700  TROPONINI <0.03   ------------------------------------------------------------------------------------------------------------------  RADIOLOGY:  No results found.  ASSESSMENT AND PLAN:  * Acute Septic shock Resolved Discontinue Rocephin, start Keflex to complete antibiotic course, successfully weaned off pressors Ur cx growing lactobaccilus species  *Acute lactobacillus urinary tract infection Resolving Keflex to finish antibiotic course  * Atrial fibrillation Stable on amiodarone Cardiology did see patient while in house  *Chronic alcoholism with ascites That is post paracentesis on October 27, 2018 with 3.6 L of fluid taken off Continue alcohol withdrawal protocol  * Hyponatremia Compared alcoholism Stable  *  Anemia Alcoholism Stable  *Acute toxic metabolic encephalopathy Likely secondary to UTI/sepsis Resolved  Disposition difficult given lack of insurance, physical therapy recommending skilled  nursing facility, patient lives in a motel setting  All the records are reviewed and case discussed with Care Management/Social Workerr. Management plans discussed with the patient, family and they are in agreement.  CODE STATUS: Full  TOTAL TIME TAKING CARE OF THIS PATIENT: 35 minutes.    POSSIBLE D/C IN 1-2 DAYS, DEPENDING ON CLINICAL CONDITION.   [name] M.D on 10/28/2018   Between 7am to 6pm - Pager - [phone]  After 6pm go to [email or web address] - password [email or web address] [facility]  Office  [phone]  CC: Primary care physician; [place]  Note: This dictation was prepared with Dragon dictation along with smaller phrase technology. Any transcriptional errors that result from this process are unintentional.

## 2018-10-28 NOTE — Progress Notes (Signed)
PHARMACY CONSULT NOTE - FOLLOW UP  Pharmacy Consult for Electrolyte Monitoring and Replacement   Recent Labs: Potassium (mmol/L)  Date Value  10/28/2018 4.2   Magnesium (mg/dL)  Date Value  10/28/2018 2.2   Calcium (mg/dL)  Date Value  10/28/2018 8.0 (L)   Albumin (g/dL)  Date Value  10/25/2018 1.9 (L)   Phosphorus (mg/dL)  Date Value  10/28/2018 3.3  ]   Assessment: Patient's electrolytes are below range goal. Pt has Afib and will aim for a higher goal for K+ and Mg. Pt is also on amiodarone which can effect K+ and Mg levels.  K+ 4.2, Mg 2.2, Corrected Ca: 9.6, Phos 3.3  Goal of Therapy:  K+: 4- 5, Mg > 2, Ca: 9-11, Phos 2.5-4.6  Plan:  no replacement needed at this time, will continue to monitor, f/u w/ am labs.  Ramond Dial ,PharmD, BCPS Clinical Pharmacist 10/28/2018 9:27 AM

## 2018-10-29 LAB — COMPREHENSIVE METABOLIC PANEL
ALK PHOS: 88 U/L (ref 38–126)
ALT: 25 U/L (ref 0–44)
AST: 65 U/L — ABNORMAL HIGH (ref 15–41)
Albumin: 1.6 g/dL — ABNORMAL LOW (ref 3.5–5.0)
Anion gap: 6 (ref 5–15)
BUN: 16 mg/dL (ref 8–23)
CALCIUM: 8.3 mg/dL — AB (ref 8.9–10.3)
CO2: 22 mmol/L (ref 22–32)
CREATININE: 0.88 mg/dL (ref 0.44–1.00)
Chloride: 100 mmol/L (ref 98–111)
Glucose, Bld: 89 mg/dL (ref 70–99)
Potassium: 3.8 mmol/L (ref 3.5–5.1)
Sodium: 128 mmol/L — ABNORMAL LOW (ref 135–145)
Total Bilirubin: 0.9 mg/dL (ref 0.3–1.2)
Total Protein: 4.9 g/dL — ABNORMAL LOW (ref 6.5–8.1)

## 2018-10-29 LAB — CBC
HCT: 31.8 % — ABNORMAL LOW (ref 36.0–46.0)
Hemoglobin: 10.8 g/dL — ABNORMAL LOW (ref 12.0–15.0)
MCH: 33.4 pg (ref 26.0–34.0)
MCHC: 34 g/dL (ref 30.0–36.0)
MCV: 98.5 fL (ref 80.0–100.0)
NRBC: 0 % (ref 0.0–0.2)
PLATELETS: 198 10*3/uL (ref 150–400)
RBC: 3.23 MIL/uL — ABNORMAL LOW (ref 3.87–5.11)
RDW: 13.2 % (ref 11.5–15.5)
WBC: 10.2 10*3/uL (ref 4.0–10.5)

## 2018-10-29 LAB — CULTURE, BLOOD (ROUTINE X 2)
CULTURE: NO GROWTH
Culture: NO GROWTH
SPECIAL REQUESTS: ADEQUATE
SPECIAL REQUESTS: ADEQUATE

## 2018-10-29 MED ORDER — MIDODRINE HCL 5 MG PO TABS: 5.0000 mg | ORAL_TABLET | Freq: Three times a day (TID) | ORAL | 0 refills | Status: DC

## 2018-10-29 MED ORDER — AMIODARONE HCL 200 MG PO TABS: 200.0000 mg | ORAL_TABLET | Freq: Every day | ORAL | 0 refills | Status: DC

## 2018-10-29 MED ORDER — ADULT MULTIVITAMIN W/MINERALS CH: 1.0000 | ORAL_TABLET | Freq: Every day | ORAL | 0 refills | Status: DC

## 2018-10-29 MED ORDER — SODIUM CHLORIDE 1 G PO TABS
1.0000 g | ORAL_TABLET | Freq: Three times a day (TID) | ORAL | Status: DC
  Administered 2018-10-29 (×2): 1 g via ORAL
  Filled 2018-10-29 (×3): qty 1

## 2018-10-29 MED ORDER — POTASSIUM CHLORIDE CRYS ER 20 MEQ PO TBCR: 40.0000 meq | EXTENDED_RELEASE_TABLET | Freq: Once | ORAL | Status: DC

## 2018-10-29 MED ORDER — VENLAFAXINE HCL 37.5 MG PO TABS: 37.5000 mg | ORAL_TABLET | Freq: Two times a day (BID) | ORAL | 0 refills | Status: DC

## 2018-10-29 MED ORDER — THIAMINE HCL 100 MG PO TABS: 100.0000 mg | ORAL_TABLET | Freq: Every day | ORAL | 0 refills | Status: DC

## 2018-10-29 MED ORDER — SODIUM CHLORIDE 1 G PO TABS: 1.0000 g | ORAL_TABLET | Freq: Two times a day (BID) | ORAL | 0 refills | Status: DC

## 2018-10-29 MED ORDER — ENSURE ENLIVE PO LIQD: 237.0000 mL | Freq: Two times a day (BID) | ORAL | 0 refills | Status: DC

## 2018-10-29 MED ORDER — APIXABAN 5 MG PO TABS: 5.0000 mg | ORAL_TABLET | Freq: Two times a day (BID) | ORAL | 0 refills | Status: DC

## 2018-10-29 NOTE — Progress Notes (Signed)
Patient seems confused again today.  She will take some medicines if crushed in apple sauce. She refuses some. She did not eat breakfast but did finish the cup of apple sauce.   Phillis Knack, RN

## 2018-10-29 NOTE — Care Management Note (Signed)
Case Management Note  Patient Details  Name: [name] MRN: [id number] Date of Birth: 1956/10/05  Subjective/Objective:  Patient to be discharged per MD order. Orders in place for home health services. Patient qualifies for charity services via [facility]. Jermaine aware of referral and has been in touch with the husband. EMS for transport. RN Sh[place] update on the plan.                   Action/Plan:   Expected Discharge Date:  10/29/18               Expected Discharge Plan:     In-House Referral:  Clinical Social Work  Discharge planning Services  CM Consult  Post Acute Care Choice:  Home Health Choice offered to:     DME Arranged:    DME Agency:     HH Arranged:  RN, PT [place] Agency:  [facility]  Status of Service:  Completed, signed off  If discussed at [place] of Stay Meetings, dates discussed:    Additional Comments:  Latanya Maudlin, RN 10/29/2018, 3:55 PM

## 2018-10-29 NOTE — Discharge Summary (Signed)
[facility] [place] NAME: [name]    MR#:  [id number]  DATE OF BIRTH:  1956/09/07  DATE OF ADMISSION:  10/24/2018 ADMITTING PHYSICIAN: Lance Coon, MD  DATE OF DISCHARGE: No discharge date for patient encounter.  PRIMARY CARE PHYSICIAN: [place]    ADMISSION DIAGNOSIS:  Lower urinary tract infectious disease [N39.0] Weakness [R53.1] Acute renal failure (ARF) (HCC) [N17.9] Atrial fibrillation with rapid ventricular response (HCC) [I48.91]  DISCHARGE DIAGNOSIS:  Principal Problem:   Sepsis ([place]) Active Problems:   A-fib ([place])   ETOH abuse   Dysthymia   SECONDARY DIAGNOSIS:   Past Medical History:  Diagnosis Date  . Cancer ([place])    skin  . Depression   . ETOH abuse     HOSPITAL COURSE:  * Acute Septic shock Resolved Treated empirically with Rocephin/Keflex while in house, successfully weaned off pressors Ur cx growing lactobaccilus species  *Acute lactobacillus urinary tract infection Resolved Treated with antibiotic course per above  * Atrial fibrillation Stable on amiodarone Cardiology did see patient while in house  *Chronic alcoholism with ascites S/P paracentesis on October 27, 2018 with 3.6 L of fluid taken off Treated on our alcohol withdrawal protocol  * Hyponatremia Due to chronic alcoholism  Treated with salt tabs   * Anemia Alcoholism Stable  *Acute toxic metabolic encephalopathy Compounded by chronic memory deficits Likely secondary to UTI/sepsis Resolved  Disposition difficult given lack of insurance, physical therapy recommending skilled nursing facility, patient lives in a motel setting, and discussion with case management/social services-we will discharge to home with home health services   DISCHARGE CONDITIONS:   stable  CONSULTS OBTAINED:  Treatment Team:  Corey Skains, MD Clapacs, Madie Reno, MD  DRUG ALLERGIES:  No Known  Allergies  DISCHARGE MEDICATIONS:   Allergies as of 10/29/2018   No Known Allergies     Medication List    TAKE these medications   amiodarone 200 MG tablet Commonly known as:  PACERONE Take 1 tablet (200 mg total) by mouth daily.   apixaban 5 MG Tabs tablet Commonly known as:  ELIQUIS Take 1 tablet (5 mg total) by mouth 2 (two) times daily.   diltiazem 120 MG 24 hr capsule Commonly known as:  CARDIZEM CD Take 1 capsule (120 mg total) by mouth daily.   feeding supplement (ENSURE ENLIVE) Liqd Take 237 mLs by mouth 2 (two) times daily between meals.   folic acid 1 MG tablet Commonly known as:  FOLVITE Take 1 tablet (1 mg total) by mouth daily.   midodrine 5 MG tablet Commonly known as:  PROAMATINE Take 1 tablet (5 mg total) by mouth 3 (three) times daily with meals.   multivitamin with minerals Tabs tablet Take 1 tablet by mouth daily.   sodium chloride 1 g tablet Take 1 tablet (1 g total) by mouth 2 (two) times daily with a meal.   thiamine 100 MG tablet Take 1 tablet (100 mg total) by mouth daily.   venlafaxine 37.5 MG tablet Commonly known as:  EFFEXOR Take 1 tablet (37.5 mg total) by mouth 2 (two) times daily with a meal.        DISCHARGE INSTRUCTIONS:    If you experience worsening of your admission symptoms, develop shortness of breath, life threatening emergency, suicidal or homicidal thoughts you must seek medical attention immediately by calling 911 or calling your MD immediately  if symptoms less severe.  You Must read complete instructions/literature along  with all the possible adverse reactions/side effects for all the Medicines you take and that have been prescribed to you. Take any new Medicines after you have completely understood and accept all the possible adverse reactions/side effects.   Please note  You were cared for by a hospitalist during your hospital stay. If you have any questions about your discharge medications or the care you  received while you were in the hospital after you are discharged, you can call the unit and asked to speak with the hospitalist on call if the hospitalist that took care of you is not available. Once you are discharged, your primary care physician will handle any further medical issues. Please note that NO REFILLS for any discharge medications will be authorized once you are discharged, as it is imperative that you return to your primary care physician (or establish a relationship with a primary care physician if you do not have one) for your aftercare needs so that they can reassess your need for medications and monitor your lab values.    Today   CHIEF COMPLAINT:   Chief Complaint  Patient presents with  . Weakness    HISTORY OF PRESENT ILLNESS:   62 y.o. female who presents with chief complaint as above.  Patient describes "not feeling well for years", and since 09/27/18 being unable to get out of bed due to bilateral leg weakness and dizziness with position change.  Patient reports a history of falls that thinks "are due to my A-fib".  Patient states she can "sometimes" feel her heart racing but has been taking her medication daily.  Patient reports some episodes of nausea with vomiting today at home, denies seeing any blood in emesis.  Patient states that she has been incontinent "for a while" and wears briefs at home.  Patient denies fever/chills/dsyuria.  Patient stated she has not noticed symptoms when having past urinary tract infections.  Patient's urinalysis positive with an elevated lactic of 2.6, hypotension and A-fib RVR and worked up as a Code Sepsis by Emergency Room staff.  Patient remains hypotensive post 3 L NS bolus, requiring vasopressors and A-fib RVR requiring diltiazem gtt for rate control.  Hospitalist called for admission.  VITAL SIGNS:  Blood pressure 97/63, pulse 71, temperature 97.9 F (36.6 C), temperature source Oral, resp. rate 18, height 5\' 9"  (1.753 m), weight 70.3  kg, SpO2 100 %.  I/O:    Intake/Output Summary (Last 24 hours) at 10/29/2018 1104 Last data filed at 10/28/2018 1854 Gross per 24 hour  Intake 240 ml  Output -  Net 240 ml    PHYSICAL EXAMINATION:  GENERAL:  62 y.o.-year-old patient lying in the bed with no acute distress.  EYES: Pupils equal, round, reactive to light and accommodation. No scleral icterus. Extraocular muscles intact.  HEENT: Head atraumatic, normocephalic. Oropharynx and nasopharynx clear.  NECK:  Supple, no jugular venous distention. No thyroid enlargement, no tenderness.  LUNGS: Normal breath sounds bilaterally, no wheezing, rales,rhonchi or crepitation. No use of accessory muscles of respiration.  CARDIOVASCULAR: S1, S2 normal. No murmurs, rubs, or gallops.  ABDOMEN: Soft, non-tender, non-distended. Bowel sounds present. No organomegaly or mass.  EXTREMITIES: No pedal edema, cyanosis, or clubbing.  NEUROLOGIC: Cranial nerves II through XII are intact. Muscle strength 5/5 in all extremities. Sensation intact. Gait not checked.  PSYCHIATRIC: The patient is alert and oriented x 3.  SKIN: No obvious rash, lesion, or ulcer.   DATA REVIEW:   CBC Recent Labs  Lab 10/29/18 0450  WBC 10.2  HGB 10.8*  HCT 31.8*  PLT 198    Chemistries  Recent Labs  Lab 10/28/18 0538 10/29/18 0450  NA 130* 128*  K 4.2 3.8  CL 101 100  CO2 22 22  GLUCOSE 105* 89  BUN 12 16  CREATININE 0.92 0.88  CALCIUM 8.0* 8.3*  MG 2.2  --   AST  --  65*  ALT  --  25  ALKPHOS  --  88  BILITOT  --  0.9    Cardiac Enzymes Recent Labs  Lab 10/24/18 1700  TROPONINI <0.03    Microbiology Results  Results for orders placed or performed during the hospital encounter of 10/24/18  Urine culture     Status: Abnormal   Collection Time: 10/24/18  5:00 PM  Result Value Ref Range Status   Specimen Description   Final    URINE, RANDOM Performed at [facility], [address] [place], [place] [address]    Special Requests    Final    NONE Performed at [facility], [address] [place], [place] [address]    Culture (A)  Final    >=100,000 COLONIES/mL LACTOBACILLUS SPECIES Standardized susceptibility testing for this organism is not available. Performed at [facility], [place] [address], [place], [place] [address]    Report Status 10/25/2018 FINAL  Final  Blood Culture (routine x 2)     Status: None   Collection Time: 10/24/18  5:01 PM  Result Value Ref Range Status   Specimen Description BLOOD BLOOD RIGHT WRIST  Final   Special Requests   Final    BOTTLES DRAWN AEROBIC AND ANAEROBIC Blood Culture adequate volume   Culture   Final    NO GROWTH 5 DAYS Performed at [facility], [address] [place], [place] [address]    Report Status 10/29/2018 FINAL  Final  Blood Culture (routine x 2)     Status: None   Collection Time: 10/24/18  5:01 PM  Result Value Ref Range Status   Specimen Description BLOOD RIGHT ANTECUBITAL  Final   Special Requests   Final    BOTTLES DRAWN AEROBIC AND ANAEROBIC Blood Culture adequate volume   Culture   Final    NO GROWTH 5 DAYS Performed at [facility], [address] [place], [place] [address]    Report Status 10/29/2018 FINAL  Final  MRSA PCR Screening     Status: None   Collection Time: 10/25/18  3:01 AM  Result Value Ref Range Status   MRSA by PCR NEGATIVE NEGATIVE Final    Comment:        The GeneXpert MRSA Assay (FDA approved for NASAL specimens only), is one component of a comprehensive MRSA colonization surveillance program. It is not intended to diagnose MRSA infection nor to guide or monitor treatment for MRSA infections. Performed at [facility], [address] [place], [place] [address]   Body fluid culture     Status: None (Preliminary result)   Collection Time: 10/26/18  4:12 PM  Result Value Ref Range Status   Specimen Description   Final    PERITONEAL Performed at [facility],  [address] [place], [place] [address]    Special Requests   Final    NONE Performed at [facility], [place], [place] [address]    Gram Stain   Final    WBC PRESENT,BOTH PMN AND MONONUCLEAR NO ORGANISMS SEEN CYTOSPIN SMEAR  Culture   Final    NO GROWTH 3 DAYS Performed at [facility], [place] [address], [place], [place] [address]    Report Status PENDING  Incomplete  C difficile quick scan w PCR reflex     Status: None   Collection Time: 10/27/18  1:24 PM  Result Value Ref Range Status   C Diff antigen NEGATIVE NEGATIVE Final   C Diff toxin NEGATIVE NEGATIVE Final   C Diff interpretation No C. difficile detected.  Final    Comment: Performed at [facility], [address] [place],  [address]    RADIOLOGY:  No results found.  EKG:   Orders placed or performed during the hospital encounter of 10/24/18  . ED EKG  . ED EKG  . EKG 12-Lead  . EKG 12-Lead  . EKG 12-Lead  . EKG 12-Lead      Management plans discussed with the patient, family and they are in agreement.  CODE STATUS:     Code Status Orders  (From admission, onward)         Start     Ordered   10/25/18 0252  Full code  Continuous     10/25/18 0251        Code Status History    Date Active Date Inactive Code Status Order ID Comments User Context   05/19/2018 1958 05/21/2018 1712 Full Code [id number]  Fritzi Mandes, MD Inpatient   11/27/2017 0109 11/29/2017 1844 Full Code [id number]  Gorden Harms, MD Inpatient   09/26/2016 1313 09/27/2016 1802 Full Code [id number]  Dustin Flock, MD ED      TOTAL TIME TAKING CARE OF THIS PATIENT: 40 minutes.    Avel Peace Sharena Dibenedetto M.D on 10/29/2018 at 11:04 AM  Between 7am to 6pm - Pager - [phone]  After 6pm go to [email or web address] - password [email or web address] [facility]  Office  [phone]  CC: Primary care physician; [place]   Note: This dictation was prepared  with Dragon dictation along with smaller phrase technology. Any transcriptional errors that result from this process are unintentional.

## 2018-10-29 NOTE — Progress Notes (Signed)
Charge RN removed PICC Patient went home with EMS Her husband was home waiting for her  Dana Knack, RN

## 2018-10-29 NOTE — Plan of Care (Signed)
  Problem: Safety: Goal: Ability to remain free from injury will improve Outcome: Progressing   Problem: Clinical Measurements: Goal: Diagnostic test results will improve Outcome: Progressing   

## 2018-10-29 NOTE — Progress Notes (Signed)
PHARMACY CONSULT NOTE - FOLLOW UP  Pharmacy Consult for Electrolyte Monitoring and Replacement   Recent Labs: Potassium (mmol/L)  Date Value  10/29/2018 3.8   Magnesium (mg/dL)  Date Value  10/28/2018 2.2   Calcium (mg/dL)  Date Value  10/29/2018 8.3 (L)   Albumin (g/dL)  Date Value  10/29/2018 1.6 (L)   Phosphorus (mg/dL)  Date Value  10/28/2018 3.3  ]   Assessment: Patient's electrolytes are below range goal. Pt has Afib and will aim for a higher goal for K+ and Mg. Pt is also on amiodarone which can effect K+ and Mg levels.  K+ 4.2, Mg 2.2, Corrected Ca: 9.6, Phos 3.3  Goal of Therapy:  K+: 4- 5, Mg > 2, Ca: 9-11, Phos 2.5-4.6  Plan:  K=3.8. Will give 40 MEQ once. Pharmacy will sign off as pt no longer in ICU and electrolytes are fairly stable.  Ramond Dial ,PharmD, BCPS Clinical Pharmacist 10/29/2018 7:49 AM

## 2018-10-30 LAB — BODY FLUID CULTURE: Culture: NO GROWTH

## 2018-10-30 LAB — CYTOLOGY - NON PAP

## 2018-10-31 LAB — LIPASE, FLUID: Lipase-Fluid: 8 U/L

## 2018-11-09 ENCOUNTER — Other Ambulatory Visit: Payer: Self-pay

## 2018-11-09 ENCOUNTER — Emergency Department: Payer: Self-pay

## 2018-11-09 ENCOUNTER — Inpatient Hospital Stay: Payer: Self-pay

## 2018-11-09 ENCOUNTER — Inpatient Hospital Stay
Admission: EM | Admit: 2018-11-09 | Discharge: 2018-11-20 | DRG: 441 | Disposition: A | Discharge status: Home Health | Payer: Self-pay | Attending: Internal Medicine | Admitting: Internal Medicine

## 2018-11-09 DIAGNOSIS — N179 Acute kidney failure, unspecified: Secondary | ICD-10-CM | POA: Diagnosis present

## 2018-11-09 DIAGNOSIS — E871 Hypo-osmolality and hyponatremia: Secondary | ICD-10-CM | POA: Diagnosis present

## 2018-11-09 DIAGNOSIS — E876 Hypokalemia: Secondary | ICD-10-CM | POA: Diagnosis present

## 2018-11-09 DIAGNOSIS — K767 Hepatorenal syndrome: Secondary | ICD-10-CM | POA: Diagnosis present

## 2018-11-09 DIAGNOSIS — E860 Dehydration: Secondary | ICD-10-CM | POA: Diagnosis present

## 2018-11-09 DIAGNOSIS — N19 Unspecified kidney failure: Secondary | ICD-10-CM | POA: Diagnosis present

## 2018-11-09 DIAGNOSIS — E8770 Fluid overload, unspecified: Secondary | ICD-10-CM | POA: Diagnosis present

## 2018-11-09 DIAGNOSIS — Z9114 Patient's other noncompliance with medication regimen: Secondary | ICD-10-CM

## 2018-11-09 DIAGNOSIS — I482 Chronic atrial fibrillation, unspecified: Secondary | ICD-10-CM | POA: Diagnosis present

## 2018-11-09 DIAGNOSIS — K5730 Diverticulosis of large intestine without perforation or abscess without bleeding: Secondary | ICD-10-CM | POA: Diagnosis present

## 2018-11-09 DIAGNOSIS — Z515 Encounter for palliative care: Secondary | ICD-10-CM | POA: Diagnosis present

## 2018-11-09 DIAGNOSIS — G9340 Encephalopathy, unspecified: Secondary | ICD-10-CM

## 2018-11-09 DIAGNOSIS — K7200 Acute and subacute hepatic failure without coma: Principal | ICD-10-CM | POA: Diagnosis present

## 2018-11-09 DIAGNOSIS — E875 Hyperkalemia: Secondary | ICD-10-CM | POA: Diagnosis present

## 2018-11-09 DIAGNOSIS — E512 Wernicke's encephalopathy: Secondary | ICD-10-CM | POA: Diagnosis present

## 2018-11-09 DIAGNOSIS — R531 Weakness: Secondary | ICD-10-CM

## 2018-11-09 DIAGNOSIS — I82409 Acute embolism and thrombosis of unspecified deep veins of unspecified lower extremity: Secondary | ICD-10-CM

## 2018-11-09 DIAGNOSIS — E872 Acidosis: Secondary | ICD-10-CM | POA: Diagnosis present

## 2018-11-09 DIAGNOSIS — K7460 Unspecified cirrhosis of liver: Secondary | ICD-10-CM | POA: Diagnosis present

## 2018-11-09 DIAGNOSIS — F1010 Alcohol abuse, uncomplicated: Secondary | ICD-10-CM | POA: Diagnosis present

## 2018-11-09 DIAGNOSIS — R338 Other retention of urine: Secondary | ICD-10-CM

## 2018-11-09 DIAGNOSIS — F17200 Nicotine dependence, unspecified, uncomplicated: Secondary | ICD-10-CM | POA: Diagnosis present

## 2018-11-09 DIAGNOSIS — R188 Other ascites: Secondary | ICD-10-CM | POA: Diagnosis present

## 2018-11-09 DIAGNOSIS — F329 Major depressive disorder, single episode, unspecified: Secondary | ICD-10-CM | POA: Diagnosis present

## 2018-11-09 LAB — CBC WITH DIFFERENTIAL/PLATELET
Abs Immature Granulocytes: 0.09 10*3/uL — ABNORMAL HIGH (ref 0.00–0.07)
BASOS ABS: 0 10*3/uL (ref 0.0–0.1)
BASOS PCT: 0 %
EOS PCT: 0 %
Eosinophils Absolute: 0 10*3/uL (ref 0.0–0.5)
HCT: 38.8 % (ref 36.0–46.0)
HEMOGLOBIN: 13.2 g/dL (ref 12.0–15.0)
Immature Granulocytes: 1 %
LYMPHS PCT: 17 %
Lymphs Abs: 1.6 10*3/uL (ref 0.7–4.0)
MCH: 33.2 pg (ref 26.0–34.0)
MCHC: 34 g/dL (ref 30.0–36.0)
MCV: 97.5 fL (ref 80.0–100.0)
Monocytes Absolute: 0.7 10*3/uL (ref 0.1–1.0)
Monocytes Relative: 8 %
NEUTROS ABS: 7.2 10*3/uL (ref 1.7–7.7)
NRBC: 0 % (ref 0.0–0.2)
Neutrophils Relative %: 74 %
PLATELETS: 330 10*3/uL (ref 150–400)
RBC: 3.98 MIL/uL (ref 3.87–5.11)
RDW: 13.2 % (ref 11.5–15.5)
WBC: 9.6 10*3/uL (ref 4.0–10.5)

## 2018-11-09 LAB — COMPREHENSIVE METABOLIC PANEL
ALBUMIN: 2 g/dL — AB (ref 3.5–5.0)
ALT: 24 U/L (ref 0–44)
ANION GAP: 10 (ref 5–15)
AST: 48 U/L — ABNORMAL HIGH (ref 15–41)
Alkaline Phosphatase: 115 U/L (ref 38–126)
BUN: 36 mg/dL — ABNORMAL HIGH (ref 8–23)
CHLORIDE: 93 mmol/L — AB (ref 98–111)
CO2: 22 mmol/L (ref 22–32)
Calcium: 8.6 mg/dL — ABNORMAL LOW (ref 8.9–10.3)
Creatinine, Ser: 2.52 mg/dL — ABNORMAL HIGH (ref 0.44–1.00)
GFR calc non Af Amer: 19 mL/min — ABNORMAL LOW (ref >60)
GFR, EST AFRICAN AMERICAN: 22 mL/min — AB (ref >60)
GLUCOSE: 96 mg/dL (ref 70–99)
POTASSIUM: 4.1 mmol/L (ref 3.5–5.1)
SODIUM: 125 mmol/L — AB (ref 135–145)
Total Bilirubin: 1.2 mg/dL (ref 0.3–1.2)
Total Protein: 5.8 g/dL — ABNORMAL LOW (ref 6.5–8.1)

## 2018-11-09 LAB — TYPE AND SCREEN
ABO/RH(D): B POS
Antibody Screen: NEGATIVE

## 2018-11-09 LAB — URINALYSIS, COMPLETE (UACMP) WITH MICROSCOPIC
RBC / HPF: >50 RBC/hpf (ref 0–5)
Specific Gravity, Urine: 1.02 (ref 1.005–1.030)

## 2018-11-09 LAB — AMMONIA: Ammonia: 21 umol/L (ref 9–35)

## 2018-11-09 LAB — PROTIME-INR
INR: 0.97
Prothrombin Time: 12.8 seconds (ref 11.4–15.2)

## 2018-11-09 LAB — TROPONIN I: Troponin I: <0.03 ng/mL (ref <0.03)

## 2018-11-09 LAB — ETHANOL: Alcohol, Ethyl (B): <10 mg/dL (ref <10)

## 2018-11-09 MED ORDER — FOLIC ACID 1 MG PO TABS
1.0000 mg | ORAL_TABLET | Freq: Every day | ORAL | Status: DC
  Administered 2018-11-10 – 2018-11-20 (×10): 1 mg via ORAL
  Filled 2018-11-09 (×11): qty 1

## 2018-11-09 MED ORDER — MIDODRINE HCL 5 MG PO TABS
5.0000 mg | ORAL_TABLET | Freq: Three times a day (TID) | ORAL | Status: DC
  Administered 2018-11-10 – 2018-11-20 (×25): 5 mg via ORAL
  Filled 2018-11-09 (×33): qty 1

## 2018-11-09 MED ORDER — SODIUM CHLORIDE 1 G PO TABS
1.0000 g | ORAL_TABLET | Freq: Two times a day (BID) | ORAL | Status: DC
  Administered 2018-11-10 – 2018-11-12 (×5): 1 g via ORAL
  Filled 2018-11-09 (×7): qty 1

## 2018-11-09 MED ORDER — VENLAFAXINE HCL 37.5 MG PO TABS
37.5000 mg | ORAL_TABLET | Freq: Two times a day (BID) | ORAL | Status: DC
  Administered 2018-11-10 – 2018-11-20 (×18): 37.5 mg via ORAL
  Filled 2018-11-09 (×22): qty 1

## 2018-11-09 MED ORDER — SODIUM CHLORIDE 0.9 % IV SOLN
Freq: Once | INTRAVENOUS | Status: AC
  Administered 2018-11-09: 14:00:00 via INTRAVENOUS

## 2018-11-09 MED ORDER — DILTIAZEM HCL ER COATED BEADS 120 MG PO CP24: 120.0000 mg | ORAL_CAPSULE | Freq: Every day | ORAL | Status: DC

## 2018-11-09 MED ORDER — ACETAMINOPHEN 650 MG RE SUPP: 650.0000 mg | Freq: Four times a day (QID) | RECTAL | Status: DC | PRN

## 2018-11-09 MED ORDER — SODIUM CHLORIDE 0.9 % IV SOLN
INTRAVENOUS | Status: DC
  Administered 2018-11-09 – 2018-11-11 (×3): via INTRAVENOUS

## 2018-11-09 MED ORDER — ENSURE ENLIVE PO LIQD
237.0000 mL | Freq: Two times a day (BID) | ORAL | Status: DC
  Administered 2018-11-10 – 2018-11-20 (×9): 237 mL via ORAL

## 2018-11-09 MED ORDER — LORAZEPAM 2 MG/ML IJ SOLN
0.0000 mg | Freq: Four times a day (QID) | INTRAMUSCULAR | Status: AC
  Administered 2018-11-10: 1 mg via INTRAVENOUS
  Filled 2018-11-09: qty 1

## 2018-11-09 MED ORDER — ONDANSETRON HCL 4 MG PO TABS: 4.0000 mg | ORAL_TABLET | Freq: Four times a day (QID) | ORAL | Status: DC | PRN

## 2018-11-09 MED ORDER — LORAZEPAM 2 MG/ML IJ SOLN: 1.0000 mg | Freq: Four times a day (QID) | INTRAMUSCULAR | Status: AC | PRN

## 2018-11-09 MED ORDER — NICOTINE 21 MG/24HR TD PT24
21.0000 mg | MEDICATED_PATCH | Freq: Every day | TRANSDERMAL | Status: DC
  Administered 2018-11-10 – 2018-11-20 (×11): 21 mg via TRANSDERMAL
  Filled 2018-11-09 (×11): qty 1

## 2018-11-09 MED ORDER — THIAMINE HCL 100 MG/ML IJ SOLN: 100.0000 mg | Freq: Every day | INTRAMUSCULAR | Status: DC

## 2018-11-09 MED ORDER — LORAZEPAM 2 MG/ML IJ SOLN
0.0000 mg | Freq: Two times a day (BID) | INTRAMUSCULAR | Status: AC
  Administered 2018-11-12: 1 mg via INTRAVENOUS
  Filled 2018-11-09: qty 1

## 2018-11-09 MED ORDER — LORAZEPAM 1 MG PO TABS: 1.0000 mg | ORAL_TABLET | Freq: Four times a day (QID) | ORAL | Status: AC | PRN

## 2018-11-09 MED ORDER — FOLIC ACID 1 MG PO TABS: 1.0000 mg | ORAL_TABLET | Freq: Every day | ORAL | Status: DC

## 2018-11-09 MED ORDER — ADULT MULTIVITAMIN W/MINERALS CH
1.0000 | ORAL_TABLET | Freq: Every day | ORAL | Status: DC
  Filled 2018-11-09: qty 1

## 2018-11-09 MED ORDER — AMIODARONE HCL 200 MG PO TABS: 200.0000 mg | ORAL_TABLET | Freq: Every day | ORAL | Status: DC

## 2018-11-09 MED ORDER — ADULT MULTIVITAMIN W/MINERALS CH
1.0000 | ORAL_TABLET | Freq: Every day | ORAL | Status: DC
  Administered 2018-11-10 – 2018-11-20 (×11): 1 via ORAL
  Filled 2018-11-09 (×11): qty 1

## 2018-11-09 MED ORDER — VITAMIN B-1 100 MG PO TABS: 100.0000 mg | ORAL_TABLET | Freq: Every day | ORAL | Status: DC

## 2018-11-09 MED ORDER — ONDANSETRON HCL 4 MG/2ML IJ SOLN
4.0000 mg | Freq: Four times a day (QID) | INTRAMUSCULAR | Status: DC | PRN
  Administered 2018-11-14: 4 mg via INTRAVENOUS
  Filled 2018-11-09: qty 2

## 2018-11-09 MED ORDER — SODIUM CHLORIDE 0.9 % IV BOLUS
1000.0000 mL | Freq: Once | INTRAVENOUS | Status: AC
  Administered 2018-11-09: 1000 mL via INTRAVENOUS

## 2018-11-09 MED ORDER — ACETAMINOPHEN 325 MG PO TABS: 650.0000 mg | ORAL_TABLET | Freq: Four times a day (QID) | ORAL | Status: DC | PRN

## 2018-11-09 MED ORDER — SENNOSIDES-DOCUSATE SODIUM 8.6-50 MG PO TABS: 1.0000 | ORAL_TABLET | Freq: Every evening | ORAL | Status: DC | PRN

## 2018-11-09 NOTE — H&P (Addendum)
[place] [facility] [place] NAME: [name]    MR#:  [id number]  DATE OF BIRTH:  May 06, 1956  DATE OF ADMISSION:  11/09/2018  PRIMARY CARE PHYSICIAN: [place]   REQUESTING/REFERRING PHYSICIAN:   CHIEF COMPLAINT:   Chief Complaint  Patient presents with  . Altered Mental Status    HISTORY OF PRESENT ILLNESS: [name]  is a 62 y.o. female with a known history of tobacco abuse, alcohol abuse, atrial fibrillation presented to the emergency room for confusion.  Patient is more awake and alert in the emergency room but has generalized weakness.  She also has some distended abdomen and with discomfort.  No complains of any vomiting of blood or blood in the stool.  Patient drinks alcohol on a regular basis and last drink was 2 days ago.  She also is an active smoker.  CT abdomen is pending.  She was worked up in the emergency room her she appears dry and dehydrated and in renal failure.  Hospitalist service is consulted for further care.  PAST MEDICAL HISTORY:   Past Medical History:  Diagnosis Date  . Cancer ([place])    skin  . Depression   . ETOH abuse     PAST SURGICAL HISTORY:  Past Surgical History:  Procedure Laterality Date  . MOLE REMOVAL      SOCIAL HISTORY:  Social History   Tobacco Use  . Smoking status: Current Every Day Smoker    Packs/day: 0.25    Types: Cigarettes  . Smokeless tobacco: Never Used  Substance Use Topics  . Alcohol use: Yes    Alcohol/week: 1.0 standard drinks    Types: 1 Shots of liquor per week    FAMILY HISTORY:  Family History  Problem Relation Age of Onset  . Breast cancer Neg Hx     DRUG ALLERGIES: No Known Allergies  REVIEW OF SYSTEMS:   CONSTITUTIONAL: No fever,has  fatigue and weakness.  EYES: No blurred or double vision.  EARS, NOSE, AND THROAT: No tinnitus or ear pain.  RESPIRATORY: No cough, shortness of breath, wheezing or hemoptysis.  CARDIOVASCULAR:  No chest pain, orthopnea, edema.  GASTROINTESTINAL: No nausea, vomiting, diarrhea  mild abdominal pain.  GENITOURINARY: No dysuria, hematuria.  ENDOCRINE: No polyuria, nocturia,  HEMATOLOGY: No anemia, easy bruising or bleeding SKIN: No rash or lesion. MUSCULOSKELETAL: No joint pain or arthritis.   NEUROLOGIC: No tingling, numbness, weakness.  PSYCHIATRY: No anxiety or depression.   MEDICATIONS AT HOME:  Prior to Admission medications   Medication Sig Start Date End Date Taking? Authorizing Provider  diltiazem (CARDIZEM CD) 120 MG 24 hr capsule Take 1 capsule (120 mg total) by mouth daily. 05/22/18  Yes Vaughan Basta, MD  feeding supplement, ENSURE ENLIVE, (ENSURE ENLIVE) LIQD Take 237 mLs by mouth 2 (two) times daily between meals. 10/29/18  Yes Salary, Avel Peace, MD  midodrine (PROAMATINE) 5 MG tablet Take 1 tablet (5 mg total) by mouth 3 (three) times daily with meals. 10/29/18  Yes Salary, Montell D, MD  thiamine 100 MG tablet Take 1 tablet (100 mg total) by mouth daily. 10/29/18  Yes Salary, Avel Peace, MD  traZODone (DESYREL) 100 MG tablet Take 100 mg by mouth at bedtime.   Yes [provider]  venlafaxine (EFFEXOR) 75 MG tablet Take 75 mg by mouth 3 (three) times daily.   Yes [provider]  amiodarone (PACERONE) 200 MG tablet Take 1 tablet (200 mg total) by mouth  daily. Patient not taking: Reported on 11/09/2018 10/29/18   Salary, Avel Peace, MD  apixaban (ELIQUIS) 5 MG TABS tablet Take 1 tablet (5 mg total) by mouth 2 (two) times daily. Patient not taking: Reported on 11/09/2018 10/29/18   Salary, Avel Peace, MD  folic acid (FOLVITE) 1 MG tablet Take 1 tablet (1 mg total) by mouth daily. Patient not taking: Reported on 10/24/2018 05/22/18   Vaughan Basta, MD  Multiple Vitamin (MULTIVITAMIN WITH MINERALS) TABS tablet Take 1 tablet by mouth daily. Patient not taking: Reported on 11/09/2018 10/29/18   Salary, Holly Bodily D, MD  sodium chloride 1 g tablet  Take 1 tablet (1 g total) by mouth 2 (two) times daily with a meal. Patient not taking: Reported on 11/09/2018 10/29/18   Salary, Avel Peace, MD  venlafaxine (EFFEXOR) 37.5 MG tablet Take 1 tablet (37.5 mg total) by mouth 2 (two) times daily with a meal. Patient not taking: Reported on 11/09/2018 10/29/18   Salary, Avel Peace, MD      PHYSICAL EXAMINATION:   VITAL SIGNS: Blood pressure (!) 87/66, pulse 78, temperature 97.6 F (36.4 C), temperature source Oral, height 5\' 9"  (1.753 m), weight 65.3 kg, SpO2 98 %.  GENERAL:  62 y.o.-year-old patient lying in the bed with no acute distress.  EYES: Pupils equal, round, reactive to light and accommodation. No scleral icterus. Extraocular muscles intact.  HEENT: Head atraumatic, normocephalic. Oropharynx dry and nasopharynx clear.  NECK:  Supple, no jugular venous distention. No thyroid enlargement, no tenderness.  LUNGS: Normal breath sounds bilaterally, no wheezing, rales,rhonchi or crepitation. No use of accessory muscles of respiration.  CARDIOVASCULAR: S1, S2 irregular. No murmurs, rubs, or gallops.  ABDOMEN: Soft, nontender, distended. Bowel sounds decreased. No organomegaly or mass.  EXTREMITIES: No pedal edema, cyanosis, or clubbing.  NEUROLOGIC: Cranial nerves II through XII are intact. Muscle strength 5/5 in all extremities. Sensation intact. Gait not checked.  PSYCHIATRIC: The patient is alert and oriented x 2.  SKIN: No obvious rash, lesion, or ulcer.   LABORATORY PANEL:   CBC Recent Labs  Lab 11/09/18 1318  WBC 9.6  HGB 13.2  HCT 38.8  PLT 330  MCV 97.5  MCH 33.2  MCHC 34.0  RDW 13.2  LYMPHSABS 1.6  MONOABS 0.7  EOSABS 0.0  BASOSABS 0.0   ------------------------------------------------------------------------------------------------------------------  Chemistries  Recent Labs  Lab 11/09/18 1318  NA 125*  K 4.1  CL 93*  CO2 22  GLUCOSE 96  BUN 36*  CREATININE 2.52*  CALCIUM 8.6*  AST 48*  ALT 24  ALKPHOS  115  BILITOT 1.2   ------------------------------------------------------------------------------------------------------------------ estimated creatinine clearance is 23.9 mL/min (A) (by C-G formula based on SCr of 2.52 mg/dL (H)). ------------------------------------------------------------------------------------------------------------------ No results for input(s): TSH, T4TOTAL, T3FREE, THYROIDAB in the last 72 hours.  Invalid input(s): FREET3   Coagulation profile Recent Labs  Lab 11/09/18 1318  INR 0.97   ------------------------------------------------------------------------------------------------------------------- No results for input(s): DDIMER in the last 72 hours. -------------------------------------------------------------------------------------------------------------------  Cardiac Enzymes Recent Labs  Lab 11/09/18 1318  TROPONINI <0.03   ------------------------------------------------------------------------------------------------------------------ Invalid input(s): POCBNP  ---------------------------------------------------------------------------------------------------------------  Urinalysis    Component Value Date/Time   COLORURINE BROWN (A) 10/24/2018 1700   APPEARANCEUR TURBID (A) 10/24/2018 1700   LABSPEC 1.014 10/24/2018 1700   PHURINE 5.0 10/24/2018 1700   GLUCOSEU NEGATIVE 10/24/2018 1700   HGBUR SMALL (A) 10/24/2018 1700   BILIRUBINUR NEGATIVE 10/24/2018 1700   KETONESUR NEGATIVE 10/24/2018 1700   PROTEINUR 100 (A) 10/24/2018 1700   NITRITE NEGATIVE 10/24/2018 1700  LEUKOCYTESUR TRACE (A) 10/24/2018 1700     RADIOLOGY: Dg Abdomen Acute W/chest  Result Date: 11/09/2018 CLINICAL DATA:  Altered mental status.  Distended abdomen. EXAM: DG ABDOMEN ACUTE W/ 1V CHEST COMPARISON:  10/25/2018 FINDINGS: There are multiple distended fluid and air-filled loops of small intestine suggesting small-bowel obstruction or ileus. Loops are  centrally located suggesting the presence of ascites. No sign of free air. No significant bone finding. One-view chest shows normal heart and mediastinal shadows with clear lungs. No effusions. No free air seen under the diaphragm. IMPRESSION: Abnormal small bowel gas pattern that could be due to small-bowel obstruction or ileus. Central location suggests the presence of ascites. Electronically Signed   By: Nelson Chimes M.D.   On: 11/09/2018 14:56    EKG: Orders placed or performed during the hospital encounter of 11/09/18  . EKG 12-Lead  . EKG 12-Lead  . ED EKG  . ED EKG    IMPRESSION AND PLAN: 62 year old female patient with history of chronic atrial fibrillation, alcohol abuse, tobacco abuse presented to the emergency room for weakness  -Acute hyponatremia IV fluid hydration Salt tablets  -Abdominal distention secondary to ascites secondary to cirrohosis of liver USD guided paracentesis Hold eliquis for procedure GI consult  -Acute renal failure Secondary to dehydration IV fluids and monitor renal function  -Tobacco abuse Tobacco cessation counseled to the patient Nicotine patch offered  -Alcohol abuse CIWA protocol Thiamine and folic acid supplements  -chronic atrial fibrillation Patient noncompliant with medications Once CT abdomen rules out ileus or obstruction will resume all oral meds Hold Eliquis for paracentesis If blood pressure tolerates then resume oral diltiazem for rate control  All the records are reviewed and case discussed with ED provider. Management plans discussed with the patient, family and they are in agreement.  CODE STATUS:Full code Code Status History    Date Active Date Inactive Code Status Order ID Comments User Context   10/25/2018 0251 10/29/2018 2045 Full Code [id number]  Lance Coon, MD Inpatient   05/19/2018 1958 05/21/2018 1712 Full Code [id number]  Fritzi Mandes, MD Inpatient   11/27/2017 0109 11/29/2017 1844 Full Code [id number]  Gorden Harms, MD Inpatient   09/26/2016 1313 09/27/2016 1802 Full Code [id number]  Dustin Flock, MD ED       TOTAL TIME TAKING CARE OF THIS PATIENT: 52 minutes.    [name] M.D on 11/09/2018 at 3:34 PM  Between 7am to 6pm - Pager - [phone]  After 6pm go to [email or web address] - password [email or web address] [facility]  Office  [phone]  CC: Primary care physician; [place]

## 2018-11-09 NOTE — ED Provider Notes (Addendum)
[facility] Emergency Department Provider Note       Time seen: ----------------------------------------- 1:07 PM on 11/09/2018 -----------------------------------------   I have reviewed the triage vital signs and the nursing notes.  HISTORY   Chief Complaint Altered Mental Status    HPI [name] is a 62 y.o. female with a history of depression, alcohol abuse, sepsis who presents to the ED for altered mental status.  EMS reports patient was lying on the bed reporting feeling weak which is unusual for her.  She was noted to have a distended abdomen.  She also appeared very pale, she denies any blood in her stool or black stools.  She states her last drink of alcohol was 2 days ago.  Past Medical History:  Diagnosis Date  . Cancer ([place])    skin  . Depression   . ETOH abuse     Patient Active Problem List   Diagnosis Date Noted  . Dysthymia 10/26/2018  . Sepsis ([place]) 10/25/2018  . ETOH abuse 10/25/2018  . A-fib ([place]) 11/26/2017  . Malnutrition of moderate degree 09/27/2016  . Acute pancreatitis 09/26/2016    Past Surgical History:  Procedure Laterality Date  . MOLE REMOVAL      Allergies Patient has no known allergies.  Social History Social History   Tobacco Use  . Smoking status: Current Every Day Smoker    Packs/day: 0.25    Types: Cigarettes  . Smokeless tobacco: Never Used  Substance Use Topics  . Alcohol use: Yes    Alcohol/week: 1.0 standard drinks    Types: 1 Shots of liquor per week  . Drug use: No    Review of Systems Constitutional: Negative for fever. Cardiovascular: Negative for chest pain. Respiratory: Negative for shortness of breath. Gastrointestinal: Negative for abdominal pain, vomiting and diarrhea. Musculoskeletal: Negative for back pain. Skin: Negative for rash. Neurological: Positive for generalized weakness  All systems negative/normal/unremarkable except as stated in the  HPI  ____________________________________________   PHYSICAL EXAM:  VITAL SIGNS: ED Triage Vitals  Enc Vitals Group     BP      Pulse      Resp      Temp      Temp src      SpO2      Weight      Height      Head Circumference      Peak Flow      Pain Score      Pain Loc      Pain Edu?      Excl. in [place]?    Constitutional: Alert and oriented.  Chronically ill-appearing, mild distress Eyes: Conjunctivae are markedly pale. Normal extraocular movements. ENT   Head: Normocephalic and atraumatic.   Nose: No congestion/rhinnorhea.   Mouth/Throat: Mucous membranes are moist.   Neck: No stridor. Cardiovascular: Normal rate, regular rhythm. No murmurs, rubs, or gallops. Respiratory: Normal respiratory effort without tachypnea nor retractions. Breath sounds are clear and equal bilaterally. No wheezes/rales/rhonchi. Gastrointestinal: Soft and nontender.  Distended with hypoactive bowel sounds. Musculoskeletal: Nontender with normal range of motion in extremities. No lower extremity tenderness nor edema. Neurologic:  Normal speech and language. No gross focal neurologic deficits are appreciated.  Skin:  Skin is warm, dry and intact.  Pallor is noted Psychiatric: Mood and affect are normal. Speech and behavior are normal.  ____________________________________________  EKG: Interpreted by me.  Sinus rhythm with a rate of 101 bpm, low voltage, normal axis, borderline long QT  ____________________________________________  ED COURSE:  As part of my medical decision making, I reviewed the following data within the [place] History obtained from family if available, nursing notes, old chart and ekg, as well as notes from prior ED visits. Patient presented for weakness, we will assess with labs and imaging as indicated at this time.   Procedures ____________________________________________   LABS (pertinent positives/negatives)  Labs Reviewed  CBC WITH  DIFFERENTIAL/PLATELET - Abnormal; Notable for the following components:      Result Value   Abs Immature Granulocytes 0.09 (*)    All other components within normal limits  COMPREHENSIVE METABOLIC PANEL - Abnormal; Notable for the following components:   Sodium 125 (*)    Chloride 93 (*)    BUN 36 (*)    Creatinine, Ser 2.52 (*)    Calcium 8.6 (*)    Total Protein 5.8 (*)    Albumin 2.0 (*)    AST 48 (*)    GFR calc non Af Amer 19 (*)    GFR calc Af Amer 22 (*)    All other components within normal limits  TROPONIN I  PROTIME-INR  AMMONIA  ETHANOL  URINALYSIS, COMPLETE (UACMP) WITH MICROSCOPIC  TYPE AND SCREEN    RADIOLOGY Images were viewed by me  Acute abdominal series IMPRESSION: Abnormal small bowel gas pattern that could be due to small-bowel obstruction or ileus. Central location suggests the presence of ascites. ____________________________________________  DIFFERENTIAL DIAGNOSIS   Dehydration, electrolyte abnormality, anemia, GI bleeding, arrhythmia, alcohol withdrawal, sepsis  FINAL ASSESSMENT AND PLAN  Weakness, acute renal failure, acute urinary retention   Plan: The patient had presented for generalized weakness. Patient's labs did indicate significant dehydration with acute renal failure likely due to poor p.o. Intake plus acute urinary retention.  There was around 800 cc of urine in her bladder, we placed a Foley catheter.  Patient's imaging was concerning for small bowel obstruction or ileus. I suspect an ileus with extensive ascites.  CT scan has been ordered.   Laurence Aly, MD   Note: This note was generated in part or whole with voice recognition software. Voice recognition is usually quite accurate but there are transcription errors that can and very often do occur. I apologize for any typographical errors that were not detected and corrected.     Earleen Newport, MD 11/09/18 1502    Earleen Newport, MD 11/09/18 [phone]

## 2018-11-09 NOTE — ED Notes (Signed)
Pt is resting in bed. Respirations even/unlabored. NADN.

## 2018-11-09 NOTE — ED Notes (Signed)
Patient hallucinating and having conversation about food, disoriented to place. Patient re oriented, to place time and self. Will continue to monitor.

## 2018-11-09 NOTE — Progress Notes (Signed)
Advanced care plan. Purpose of the Encounter: CODE STATUS Parties in Attendance: Patient Patient's Decision Capacity: Good Subjective/Patient's story: Presented to emergency room for generalized weakness, abdominal distention and discomfort Objective/Medical story Needs further evaluation with a CT abdomen for any ileus versus obstruction Patient is in renal failure needs IV fluids for dehydration Goals of care determination:  Advance care directives and goals of care discussed with the patient in detail along with treatment plan Patient wants everything done which includes CPR, intubation ventilator if the need arises CODE STATUS: Full code Time spent discussing advanced care planning: 16 minutes

## 2018-11-09 NOTE — ED Notes (Signed)
Patient having psychosis reaching for things in the air. Speaking things out of content.

## 2018-11-09 NOTE — ED Notes (Signed)
Patient b/p responding to treatment.

## 2018-11-09 NOTE — ED Notes (Addendum)
Dr Orbie Pyo called with B/P results for patient grover. AS per Md ok to send patient up to unit.

## 2018-11-09 NOTE — ED Triage Notes (Signed)
Patient from home found with change of mental status. As per EMD reports patient was laying in bed reported feeling weak, which is unusual for her. Patient presents with distended abd, firm and pale.

## 2018-11-10 ENCOUNTER — Inpatient Hospital Stay: Payer: Self-pay

## 2018-11-10 DIAGNOSIS — E871 Hypo-osmolality and hyponatremia: Secondary | ICD-10-CM

## 2018-11-10 DIAGNOSIS — K7031 Alcoholic cirrhosis of liver with ascites: Secondary | ICD-10-CM

## 2018-11-10 DIAGNOSIS — R338 Other retention of urine: Secondary | ICD-10-CM

## 2018-11-10 DIAGNOSIS — G9340 Encephalopathy, unspecified: Secondary | ICD-10-CM

## 2018-11-10 DIAGNOSIS — N179 Acute kidney failure, unspecified: Secondary | ICD-10-CM

## 2018-11-10 LAB — LACTATE DEHYDROGENASE, PLEURAL OR PERITONEAL FLUID: LD, Fluid: 44 U/L — ABNORMAL HIGH (ref 3–23)

## 2018-11-10 LAB — BASIC METABOLIC PANEL
Anion gap: 10 (ref 5–15)
BUN: 36 mg/dL — ABNORMAL HIGH (ref 8–23)
CALCIUM: 7.8 mg/dL — AB (ref 8.9–10.3)
CHLORIDE: 98 mmol/L (ref 98–111)
CO2: 18 mmol/L — ABNORMAL LOW (ref 22–32)
CREATININE: 2.54 mg/dL — AB (ref 0.44–1.00)
GFR calc Af Amer: 22 mL/min — ABNORMAL LOW (ref >60)
GFR calc non Af Amer: 19 mL/min — ABNORMAL LOW (ref >60)
Glucose, Bld: 78 mg/dL (ref 70–99)
Potassium: 3.9 mmol/L (ref 3.5–5.1)
Sodium: 126 mmol/L — ABNORMAL LOW (ref 135–145)

## 2018-11-10 LAB — URINALYSIS, COMPLETE (UACMP) WITH MICROSCOPIC
BACTERIA UA: NONE SEEN
BILIRUBIN URINE: NEGATIVE
Glucose, UA: NEGATIVE mg/dL
KETONES UR: NEGATIVE mg/dL
Nitrite: NEGATIVE
Protein, ur: 30 mg/dL — AB
Specific Gravity, Urine: 1.014 (ref 1.005–1.030)
pH: 5 (ref 5.0–8.0)

## 2018-11-10 LAB — CBC
HEMATOCRIT: 36 % (ref 36.0–46.0)
Hemoglobin: 11.9 g/dL — ABNORMAL LOW (ref 12.0–15.0)
MCH: 32.3 pg (ref 26.0–34.0)
MCHC: 33.1 g/dL (ref 30.0–36.0)
MCV: 97.8 fL (ref 80.0–100.0)
NRBC: 0 % (ref 0.0–0.2)
PLATELETS: 324 10*3/uL (ref 150–400)
RBC: 3.68 MIL/uL — ABNORMAL LOW (ref 3.87–5.11)
RDW: 13.2 % (ref 11.5–15.5)
WBC: 12.5 10*3/uL — AB (ref 4.0–10.5)

## 2018-11-10 LAB — BODY FLUID CELL COUNT WITH DIFFERENTIAL
Eos, Fluid: 0 %
Lymphs, Fluid: 26 %
MONOCYTE-MACROPHAGE-SEROUS FLUID: 41 %
NEUTROPHIL FLUID: 33 %
Other Cells, Fluid: 0 %
WBC FLUID: 25 uL

## 2018-11-10 LAB — PROTEIN, PLEURAL OR PERITONEAL FLUID

## 2018-11-10 LAB — GLUCOSE, PLEURAL OR PERITONEAL FLUID: GLUCOSE FL: 81 mg/dL

## 2018-11-10 LAB — ALBUMIN, PLEURAL OR PERITONEAL FLUID

## 2018-11-10 LAB — AMYLASE, PLEURAL OR PERITONEAL FLUID: Amylase, Fluid: 10 U/L

## 2018-11-10 MED ORDER — VITAMIN B-1 100 MG PO TABS
100.0000 mg | ORAL_TABLET | Freq: Every day | ORAL | Status: DC
  Administered 2018-11-10 – 2018-11-13 (×4): 100 mg via ORAL
  Filled 2018-11-10 (×4): qty 1

## 2018-11-10 MED ORDER — ENOXAPARIN SODIUM 30 MG/0.3ML ~~LOC~~ SOLN
30.0000 mg | SUBCUTANEOUS | Status: DC
  Administered 2018-11-10 – 2018-11-12 (×3): 30 mg via SUBCUTANEOUS
  Filled 2018-11-10 (×3): qty 0.3

## 2018-11-10 MED ORDER — GUAIFENESIN-DM 100-10 MG/5ML PO SYRP: 5.0000 mL | ORAL_SOLUTION | ORAL | Status: DC | PRN

## 2018-11-10 MED ORDER — SODIUM CHLORIDE 0.9 % IV BOLUS: 250.0000 mL | Freq: Once | INTRAVENOUS | Status: DC

## 2018-11-10 MED ORDER — ALBUMIN HUMAN 25 % IV SOLN
25.0000 g | Freq: Three times a day (TID) | INTRAVENOUS | Status: DC
  Administered 2018-11-10 – 2018-11-12 (×6): 25 g via INTRAVENOUS
  Filled 2018-11-10 (×8): qty 100

## 2018-11-10 MED ORDER — SODIUM CHLORIDE 0.9 % IV SOLN
1.0000 g | INTRAVENOUS | Status: AC
  Administered 2018-11-10 – 2018-11-14 (×5): 1 g via INTRAVENOUS
  Filled 2018-11-10 (×4): qty 1
  Filled 2018-11-10: qty 10

## 2018-11-10 MED ORDER — ALBUMIN HUMAN 25 % IV SOLN
25.0000 g | Freq: Once | INTRAVENOUS | Status: AC
  Administered 2018-11-10: 25 g via INTRAVENOUS
  Filled 2018-11-10: qty 100

## 2018-11-10 MED ORDER — SODIUM CHLORIDE 0.9 % IV BOLUS
250.0000 mL | Freq: Once | INTRAVENOUS | Status: AC
  Administered 2018-11-10: 250 mL via INTRAVENOUS

## 2018-11-10 MED ORDER — THIAMINE HCL 100 MG/ML IJ SOLN: 100.0000 mg | Freq: Every day | INTRAMUSCULAR | Status: DC

## 2018-11-10 MED ORDER — ENOXAPARIN SODIUM 40 MG/0.4ML ~~LOC~~ SOLN: 40.0000 mg | SUBCUTANEOUS | Status: DC

## 2018-11-10 NOTE — Consult Note (Signed)
[facility]  CONSULT NOTE    Date: 11/10/2018                  Patient Name:  [name]  MRN: [id number]  DOB: December 08, 1956  Age / Sex: 62 y.o., female         PCP: [place]                 Service Requesting Consult: Dr. Tressia Miners                 Reason for Consult: Acute renal failure            History of Present Illness: [name] is a 62 y.o. white female with , who was admitted to [facility] on 11/09/2018 for Hyponatremia [E87.1] Acute urinary retention [R33.8] Ascites [R18.8] Acute renal failure, unspecified acute renal failure type ([place]) [N17.9]  Patient had large volume paracentesis today 5 liters removed.   Patient unable to give a history  Started on IV fluids   Medications: Outpatient medications: Medications Prior to Admission  Medication Sig Dispense Refill Last Dose  . diltiazem (CARDIZEM CD) 120 MG 24 hr capsule Take 1 capsule (120 mg total) by mouth daily. 30 capsule 0 11/09/2018 at am  . feeding supplement, ENSURE ENLIVE, (ENSURE ENLIVE) LIQD Take 237 mLs by mouth 2 (two) times daily between meals. 90 Bottle 0   . midodrine (PROAMATINE) 5 MG tablet Take 1 tablet (5 mg total) by mouth 3 (three) times daily with meals. 90 tablet 0 11/09/2018 at am  . thiamine 100 MG tablet Take 1 tablet (100 mg total) by mouth daily. 30 tablet 0 11/09/2018 at am  . traZODone (DESYREL) 100 MG tablet Take 100 mg by mouth at bedtime.   11/08/2018 at pm  . venlafaxine (EFFEXOR) 75 MG tablet Take 75 mg by mouth 3 (three) times daily.   11/09/2018 at am  . amiodarone (PACERONE) 200 MG tablet Take 1 tablet (200 mg total) by mouth daily. (Patient not taking: Reported on 11/09/2018) 90 tablet 0 Not Taking at Unknown time  . apixaban (ELIQUIS) 5 MG TABS tablet Take 1 tablet (5 mg total) by mouth 2 (two) times daily. (Patient not taking: Reported on 11/09/2018) 60 tablet 0 Not Taking at Unknown time  . folic acid (FOLVITE) 1 MG tablet  Take 1 tablet (1 mg total) by mouth daily. (Patient not taking: Reported on 10/24/2018) 30 tablet 0 Not Taking at Unknown time  . Multiple Vitamin (MULTIVITAMIN WITH MINERALS) TABS tablet Take 1 tablet by mouth daily. (Patient not taking: Reported on 11/09/2018) 180 tablet 0 Not Taking at Unknown time  . sodium chloride 1 g tablet Take 1 tablet (1 g total) by mouth 2 (two) times daily with a meal. (Patient not taking: Reported on 11/09/2018) 20 tablet 0 Not Taking at Unknown time  . venlafaxine (EFFEXOR) 37.5 MG tablet Take 1 tablet (37.5 mg total) by mouth 2 (two) times daily with a meal. (Patient not taking: Reported on 11/09/2018) 120 tablet 0 Not Taking at Unknown time    Current medications: Current Facility-Administered Medications  Medication Dose Route Frequency Provider Last Rate Last Dose  . 0.9 %  sodium chloride infusion   Intravenous Continuous [name], MD 75 mL/hr at 11/10/18 1401    . acetaminophen (TYLENOL) tablet 650 mg  650 mg Oral Q6H PRN Pyreddy, Reatha Harps, MD       Or  . acetaminophen (TYLENOL) suppository 650 mg  650  mg Rectal Q6H PRN [name], MD      . albumin human 25 % solution 25 g  25 g Intravenous Q8H Gladstone Lighter, MD   Stopped at 11/10/18 1058  . cefTRIAXone (ROCEPHIN) 1 g in sodium chloride 0.9 % 100 mL IVPB  1 g Intravenous Q24H Gladstone Lighter, MD 200 mL/hr at 11/10/18 1623 1 g at 11/10/18 1623  . enoxaparin (LOVENOX) injection 30 mg  30 mg Subcutaneous Q24H Ramond Dial, RPH      . feeding supplement (ENSURE ENLIVE) (ENSURE ENLIVE) liquid 237 mL  237 mL Oral BID BM Pyreddy, Pavan, MD   237 mL at 11/10/18 1339  . folic acid (FOLVITE) tablet 1 mg  1 mg Oral Daily Pyreddy, Pavan, MD   1 mg at 11/10/18 1035  . LORazepam (ATIVAN) injection 0-4 mg  0-4 mg Intravenous Q6H Pyreddy, Pavan, MD   1 mg at 11/10/18 1051   Followed by  . [START ON 11/11/2018] LORazepam (ATIVAN) injection 0-4 mg  0-4 mg Intravenous Q12H Pyreddy, Pavan, MD      . LORazepam  (ATIVAN) tablet 1 mg  1 mg Oral Q6H PRN [name], MD       Or  . LORazepam (ATIVAN) injection 1 mg  1 mg Intravenous Q6H PRN Pyreddy, Pavan, MD      . midodrine (PROAMATINE) tablet 5 mg  5 mg Oral TID WC Pyreddy, Reatha Harps, MD   5 mg at 11/10/18 1702  . multivitamin with minerals tablet 1 tablet  1 tablet Oral Daily [name], MD   1 tablet at 11/10/18 1035  . nicotine (NICODERM CQ - dosed in mg/24 hours) patch 21 mg  21 mg Transdermal Daily Pyreddy, Pavan, MD   21 mg at 11/10/18 1100  . ondansetron (ZOFRAN) tablet 4 mg  4 mg Oral Q6H PRN [name], MD       Or  . ondansetron (ZOFRAN) injection 4 mg  4 mg Intravenous Q6H PRN Pyreddy, Reatha Harps, MD      . senna-docusate (Senokot-S) tablet 1 tablet  1 tablet Oral QHS PRN Pyreddy, Reatha Harps, MD      . sodium chloride 0.9 % bolus 250 mL  250 mL Intravenous Once Gladstone Lighter, MD      . sodium chloride tablet 1 g  1 g Oral BID WC Pyreddy, Reatha Harps, MD   1 g at 11/10/18 1649  . thiamine (VITAMIN B-1) tablet 100 mg  100 mg Oral Daily Oswald Hillock, RPH   100 mg at 11/10/18 1035   Or  . thiamine (B-1) injection 100 mg  100 mg Intravenous Daily Oswald Hillock, RPH      . venlafaxine [facility]) tablet 37.5 mg  37.5 mg Oral BID WC Pyreddy, Reatha Harps, MD   37.5 mg at 11/10/18 1035      Allergies: No Known Allergies    Past Medical History: Past Medical History:  Diagnosis Date  . Cancer ([place])    skin  . Depression   . ETOH abuse      Past Surgical History: Past Surgical History:  Procedure Laterality Date  . MOLE REMOVAL       Family History: Family History  Problem Relation Age of Onset  . Breast cancer Neg Hx      Social History: Social History   Socioeconomic History  . Marital status: Married    Spouse name: Not on file  . Number of children: Not on file  . Years of education: Not on file  .  Highest education level: Not on file  Occupational History  . Not on file  Social Needs  . Financial resource strain: Not  on file  . Food insecurity:    Worry: Not on file    Inability: Not on file  . Transportation needs:    Medical: Not on file    Non-medical: Not on file  Tobacco Use  . Smoking status: Current Every Day Smoker    Packs/day: 0.25    Types: Cigarettes  . Smokeless tobacco: Never Used  Substance and Sexual Activity  . Alcohol use: Yes    Alcohol/week: 1.0 standard drinks    Types: 1 Shots of liquor per week  . Drug use: No  . Sexual activity: Never  Lifestyle  . Physical activity:    Days per week: Not on file    Minutes per session: Not on file  . Stress: Not on file  Relationships  . Social connections:    Talks on phone: Not on file    Gets together: Not on file    Attends religious service: Not on file    Active member of club or organization: Not on file    Attends meetings of clubs or organizations: Not on file    Relationship status: Not on file  . Intimate partner violence:    Fear of current or ex partner: Not on file    Emotionally abused: Not on file    Physically abused: Not on file    Forced sexual activity: Not on file  Other Topics Concern  . Not on file  Social History Narrative  . Not on file     Review of Systems: Review of Systems  Unable to perform ROS: Medical condition    Vital Signs: Blood pressure (!) 79/57, pulse 87, temperature 98 F (36.7 C), temperature source Tympanic, resp. rate 16, height 5\' 9"  (1.753 m), weight 65.3 kg, SpO2 100 %.  Weight trends: Filed Weights   11/09/18 1321  Weight: 65.3 kg    Physical Exam: General: NAD, laying in bed  Head: Normocephalic, atraumatic. Moist oral mucosal membranes  Eyes: Anicteric, PERRL  Neck: Supple, trachea midline  Lungs:  Clear to auscultation  Heart: Regular rate and rhythm  Abdomen:  +ascites  Extremities: + peripheral edema.  Neurologic: Not answering questions  Skin: No lesions         Lab results: Basic Metabolic Panel: Recent Labs  Lab 11/09/18 1318 11/10/18 0324   NA 125* 126*  K 4.1 3.9  CL 93* 98  CO2 22 18*  GLUCOSE 96 78  BUN 36* 36*  CREATININE 2.52* 2.54*  CALCIUM 8.6* 7.8*    Liver Function Tests: Recent Labs  Lab 11/09/18 1318  AST 48*  ALT 24  ALKPHOS 115  BILITOT 1.2  PROT 5.8*  ALBUMIN 2.0*   No results for input(s): LIPASE, AMYLASE in the last 168 hours. Recent Labs  Lab 11/09/18 1318  AMMONIA 21    CBC: Recent Labs  Lab 11/09/18 1318 11/10/18 0324  WBC 9.6 12.5*  NEUTROABS 7.2  --   HGB 13.2 11.9*  HCT 38.8 36.0  MCV 97.5 97.8  PLT 330 324    Cardiac Enzymes: Recent Labs  Lab 11/09/18 1318  TROPONINI <0.03    BNP: Invalid input(s): POCBNP  CBG: No results for input(s): GLUCAP in the last 168 hours.  Microbiology: Results for orders placed or performed during the hospital encounter of 10/24/18  Urine culture     Status: Abnormal  Collection Time: 10/24/18  5:00 PM  Result Value Ref Range Status   Specimen Description   Final    URINE, RANDOM Performed at [facility], [address], [place], [place] [address]    Special Requests   Final    NONE Performed at [facility], [address] [place], [place] [address]    Culture (A)  Final    >=100,000 COLONIES/mL LACTOBACILLUS SPECIES Standardized susceptibility testing for this organism is not available. Performed at [facility], [place] [address] [place], [place] [address]    Report Status 10/25/2018 FINAL  Final  Blood Culture (routine x 2)     Status: None   Collection Time: 10/24/18  5:01 PM  Result Value Ref Range Status   Specimen Description BLOOD BLOOD RIGHT WRIST  Final   Special Requests   Final    BOTTLES DRAWN AEROBIC AND ANAEROBIC Blood Culture adequate volume   Culture   Final    NO GROWTH 5 DAYS Performed at [facility], [address] [place], [place] [address]    Report Status 10/29/2018 FINAL  Final  Blood Culture (routine x 2)     Status: None   Collection Time: 10/24/18   5:01 PM  Result Value Ref Range Status   Specimen Description BLOOD RIGHT ANTECUBITAL  Final   Special Requests   Final    BOTTLES DRAWN AEROBIC AND ANAEROBIC Blood Culture adequate volume   Culture   Final    NO GROWTH 5 DAYS Performed at [facility], [address] [place], [place] [address]    Report Status 10/29/2018 FINAL  Final  MRSA PCR Screening     Status: None   Collection Time: 10/25/18  3:01 AM  Result Value Ref Range Status   MRSA by PCR NEGATIVE NEGATIVE Final    Comment:        The GeneXpert MRSA Assay (FDA approved for NASAL specimens only), is one component of a comprehensive MRSA colonization surveillance program. It is not intended to diagnose MRSA infection nor to guide or monitor treatment for MRSA infections. Performed at [facility], [address] [place], [place] [address]   Body fluid culture     Status: None   Collection Time: 10/26/18  4:12 PM  Result Value Ref Range Status   Specimen Description   Final    PERITONEAL Performed at [facility], [address] [place], [place] [address]    Special Requests   Final    NONE Performed at [facility], [address] [place], [place] [address]    Gram Stain   Final    WBC PRESENT,BOTH PMN AND MONONUCLEAR NO ORGANISMS SEEN CYTOSPIN SMEAR    Culture   Final    NO GROWTH 3 DAYS Performed at [facility], [place] [address] [place], [place] [address]    Report Status 10/30/2018 FINAL  Final  C difficile quick scan w PCR reflex     Status: None   Collection Time: 10/27/18  1:24 PM  Result Value Ref Range Status   C Diff antigen NEGATIVE NEGATIVE Final   C Diff toxin NEGATIVE NEGATIVE Final   C Diff interpretation No C. difficile detected.  Final    Comment: Performed at [facility], [address] [place],  [address]    Coagulation Studies: Recent Labs    11/09/18 1318  LABPROT 12.8  INR 0.97    Urinalysis: Recent  Labs  11/09/18 1520 11/10/18 1628  [place] 1.020 1.014  PHURINE TEST NOT REPORTED DUE TO COLOR INTERFERENCE OF URINE PIGMENT 5.0  GLUCOSEU TEST NOT REPORTED DUE TO COLOR INTERFERENCE OF URINE PIGMENT* NEGATIVE  HGBUR TEST NOT REPORTED DUE TO COLOR INTERFERENCE OF URINE PIGMENT* LARGE*  BILIRUBINUR TEST NOT REPORTED DUE TO COLOR INTERFERENCE OF URINE PIGMENT* NEGATIVE  KETONESUR TEST NOT REPORTED DUE TO COLOR INTERFERENCE OF URINE PIGMENT* NEGATIVE  PROTEINUR TEST NOT REPORTED DUE TO COLOR INTERFERENCE OF URINE PIGMENT* 30*  NITRITE TEST NOT REPORTED DUE TO COLOR INTERFERENCE OF URINE PIGMENT* NEGATIVE  LEUKOCYTESUR TEST NOT REPORTED DUE TO COLOR INTERFERENCE OF URINE PIGMENT* MODERATE*      Imaging: Ct Abdomen Pelvis Wo Contrast  Result Date: 11/09/2018 CLINICAL DATA:  Abdominal distension. EXAM: CT ABDOMEN AND PELVIS WITHOUT CONTRAST TECHNIQUE: Multidetector CT imaging of the abdomen and pelvis was performed following the standard protocol without IV contrast. COMPARISON:  CT scan of September 26, 2016. FINDINGS: Lower chest: Wall thickening of distal esophagus is noted. Visualized lung bases are unremarkable. Hepatobiliary: Small gallstone is noted. No biliary dilatation is noted. Findings consistent with hepatic cirrhosis. No definite focal abnormality seen in the liver on these unenhanced images. Pancreas: Unremarkable. No pancreatic ductal dilatation or surrounding inflammatory changes. Spleen: Appears to be significantly smaller in size compared to prior exam, but no definite focal abnormality is noted. Adrenals/Urinary Tract: Adrenal glands are unremarkable. Kidneys are normal, without renal calculi, focal lesion, or hydronephrosis. Bladder is decompressed secondary to Foley catheter. Stomach/Bowel: Severe wall thickening of proximal stomach is noted concerning for gastritis. Appendix appears normal. No evidence of bowel wall thickening, distention, or  inflammatory changes. Sigmoid diverticulosis is noted without inflammation. Vascular/Lymphatic: Aortic atherosclerosis. No enlarged abdominal or pelvic lymph nodes. Reproductive: Status post hysterectomy. No adnexal masses. Other: Severe ascites is noted.  No definite hernia is noted. Musculoskeletal: No acute or significant osseous findings. IMPRESSION: Hepatic cirrhosis is noted with severe ascites. Wall thickening of distal esophagus is noted as well as significant wall and fold thickening of proximal stomach. This is concerning for esophagitis and/or gastritis. Endoscopy is recommended for further evaluation. Sigmoid diverticulosis without inflammation. Aortic Atherosclerosis (ICD10-I70.0). Electronically Signed   By: Marijo Conception, M.D.   On: 11/09/2018 15:53   Ct Head Wo Contrast  Result Date: 11/10/2018 CLINICAL DATA:  Altered mental status x2 days. EXAM: CT HEAD WITHOUT CONTRAST TECHNIQUE: Contiguous axial images were obtained from the base of the skull through the vertex without intravenous contrast. COMPARISON:  None. FINDINGS: BRAIN: There is mild sulcal prominence consistent with superficial atrophy. No hydrocephalus. No intraparenchymal hemorrhage, mass effect nor midline shift. 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 No acute large vascular territory infarcts. No abnormal extra-axial fluid collections. Basal cisterns are not effaced and midline. Mild cerebellar atrophy. Brainstem and cerebellum are nonacute. VASCULAR: No hyperdense vessel sign or unexpected calcifications. SKULL: No skull fracture. No significant scalp soft tissue swelling. SINUSES/ORBITS: The mastoid air-cells are clear. The included paranasal sinuses are well-aerated.The included ocular globes and orbital contents are non-suspicious. OTHER: None. IMPRESSION: 1. 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 2. No  acute intracranial abnormality. 3. Age related involutional changes of the brain. Electronically Signed   By: Ashley Royalty M.D.   On: 11/10/2018 15:41   US Renal  Result Date: 11/10/2018 CLINICAL DATA:  Acute renal failure. EXAM: RENAL / URINARY TRACT ULTRASOUND COMPLETE COMPARISON:  CT scan of  November 09, 2018. FINDINGS: Right Kidney: Renal measurements: 10.0 x 5.3 x 5.0 cm = volume: 138 mL. Probable 1.9 cm exophytic cyst is seen arising from upper pole. Mildly increased echogenicity of renal parenchyma is noted. No mass or hydronephrosis visualized. Left Kidney: Renal measurements: 9.5 x 4.2 x 3.9 cm = volume: 81 mL. Mildly increased echogenicity of renal parenchyma is noted. No mass or hydronephrosis visualized. Bladder: Decompressed secondary to Foley catheter.  Mild ascites is noted. IMPRESSION: Mildly increased echogenicity of renal parenchyma is noted bilaterally suggesting medical renal disease. No hydronephrosis or renal obstruction is noted. Ascites is noted. Electronically Signed   By: Marijo Conception, M.D.   On: 11/10/2018 13:33   US Paracentesis  Result Date: 11/10/2018 INDICATION: 62 year old with ascites. EXAM: ULTRASOUND-GUIDED PARACENTESIS COMPARISON:  None. MEDICATIONS: Nonene. COMPLICATIONS: None immediate. TECHNIQUE: Verbal consent was obtained from the patient's husband after a discussion of the risks, benefits and alternatives to treatment. A timeout was performed prior to the initiation of the procedure. Initial ultrasound scanning demonstrates a large amount of ascites within the right lower abdominal quadrant. The right lower abdomen was prepped and draped in the usual sterile fashion. 1% lidocaine was used for local anesthesia. Under direct ultrasound guidance, a Safe-T-Centesis catheter was introduced. An ultrasound image was saved for documentation purposed. The paracentesis was performed. The catheter was removed and a dressing was applied. The patient tolerated the procedure  well without immediate post procedural complication. FINDINGS: A total of approximately 5 L liters of yellow fluid was removed. Samples were sent to the laboratory as requested by the clinical team. IMPRESSION: Successful ultrasound-guided paracentesis yielding 5 liters of peritoneal fluid. Electronically Signed   By: Markus Daft M.D.   On: 11/10/2018 13:11   Dg Abdomen Acute W/chest  Result Date: 11/09/2018 CLINICAL DATA:  Altered mental status.  Distended abdomen. EXAM: DG ABDOMEN ACUTE W/ 1V CHEST COMPARISON:  10/25/2018 FINDINGS: There are multiple distended fluid and air-filled loops of small intestine suggesting small-bowel obstruction or ileus. Loops are centrally located suggesting the presence of ascites. No sign of free air. No significant bone finding. One-view chest shows normal heart and mediastinal shadows with clear lungs. No effusions. No free air seen under the diaphragm. IMPRESSION: Abnormal small bowel gas pattern that could be due to small-bowel obstruction or ileus. Central location suggests the presence of ascites. Electronically Signed   By: Nelson Chimes M.D.   On: 11/09/2018 14:56      Assessment & Plan: [name] is a 62 y.o. white female with hepatic cirrhosis, atrial fibrillation, EtOH, depression, who was admitted to [facility] on 11/09/2018 for Hyponatremia [E87.1] Acute urinary retention [R33.8] Ascites [R18.8] Acute renal failure, unspecified acute renal failure type ([place]) [N17.9]  1. Hyponatremia 2. Acute renal Failure 3. Edema and ascites 4. Acute hepatic encephalopathy  Impression Condition is concerning for hepatorenal syndrome. However treating for prerenal azotemia first. If no improvement, will consider diuresis.      LOS: 1 [name] 11/22/20195:25 PM

## 2018-11-10 NOTE — Progress Notes (Signed)
[facility] NAME: Cleaster Shiffer    MR#:  [id number]  DATE OF BIRTH:  1956/06/23  SUBJECTIVE:  CHIEF COMPLAINT:   Chief Complaint  Patient presents with  . Altered Mental Status   - patient is confused, delirious - abd is distended  REVIEW OF SYSTEMS:  Review of Systems  Unable to perform ROS: Mental status change    DRUG ALLERGIES:  No Known Allergies  VITALS:  Blood pressure (!) 84/55, pulse 82, temperature 97.7 F (36.5 C), temperature source Oral, resp. rate 16, height 5\' 9"  (1.753 m), weight 65.3 kg, SpO2 98 %.  PHYSICAL EXAMINATION:  Physical Exam  GENERAL:  62 y.o.-year-old patient lying in the bed with no acute distress.  EYES: Pupils equal, round, reactive to light and accommodation. No scleral icterus. Extraocular muscles intact.  HEENT: Head atraumatic, normocephalic. Oropharynx and nasopharynx clear.  NECK:  Supple, no jugular venous distention. No thyroid enlargement, no tenderness.  LUNGS: Normal breath sounds bilaterally, no wheezing, rales,rhonchi or crepitation. No use of accessory muscles of respiration.  Decreased bibasilar breath sounds CARDIOVASCULAR: S1, S2 normal. No rubs, or gallops.  2/6 systolic murmur present ABDOMEN: firm and distended abdomen,  Bowel sounds present. No organomegaly or mass.  EXTREMITIES: No pedal edema, cyanosis, or clubbing.  NEUROLOGIC: Cranial nerves II through XII are intact. Muscle strength 5/5 in all extremities. Sensation intact. Gait not checked.  Able to follow simple commands, global weakness noted PSYCHIATRIC: The patient is alert and disoriented SKIN: No obvious rash, lesion, or ulcer.    LABORATORY PANEL:   CBC Recent Labs  Lab 11/10/18 0324  WBC 12.5*  HGB 11.9*  HCT 36.0  PLT 324   ------------------------------------------------------------------------------------------------------------------  Chemistries  Recent Labs  Lab 11/09/18 1318  11/10/18 0324  NA 125* 126*  K 4.1 3.9  CL 93* 98  CO2 22 18*  GLUCOSE 96 78  BUN 36* 36*  CREATININE 2.52* 2.54*  CALCIUM 8.6* 7.8*  AST 48*  --   ALT 24  --   ALKPHOS 115  --   BILITOT 1.2  --    ------------------------------------------------------------------------------------------------------------------  Cardiac Enzymes Recent Labs  Lab 11/09/18 1318  TROPONINI <0.03   ------------------------------------------------------------------------------------------------------------------  RADIOLOGY:  Ct Abdomen Pelvis Wo Contrast  Result Date: 11/09/2018 CLINICAL DATA:  Abdominal distension. EXAM: CT ABDOMEN AND PELVIS WITHOUT CONTRAST TECHNIQUE: Multidetector CT imaging of the abdomen and pelvis was performed following the standard protocol without IV contrast. COMPARISON:  CT scan of September 26, 2016. FINDINGS: Lower chest: Wall thickening of distal esophagus is noted. Visualized lung bases are unremarkable. Hepatobiliary: Small gallstone is noted. No biliary dilatation is noted. Findings consistent with hepatic cirrhosis. No definite focal abnormality seen in the liver on these unenhanced images. Pancreas: Unremarkable. No pancreatic ductal dilatation or surrounding inflammatory changes. Spleen: Appears to be significantly smaller in size compared to prior exam, but no definite focal abnormality is noted. Adrenals/Urinary Tract: Adrenal glands are unremarkable. Kidneys are normal, without renal calculi, focal lesion, or hydronephrosis. Bladder is decompressed secondary to Foley catheter. Stomach/Bowel: Severe wall thickening of proximal stomach is noted concerning for gastritis. Appendix appears normal. No evidence of bowel wall thickening, distention, or inflammatory changes. Sigmoid diverticulosis is noted without inflammation. Vascular/Lymphatic: Aortic atherosclerosis. No enlarged abdominal or pelvic lymph nodes. Reproductive: Status post hysterectomy. No adnexal masses. Other:  Severe ascites is noted.  No definite hernia is noted. Musculoskeletal: No acute or significant osseous findings. IMPRESSION: Hepatic cirrhosis is  noted with severe ascites. Wall thickening of distal esophagus is noted as well as significant wall and fold thickening of proximal stomach. This is concerning for esophagitis and/or gastritis. Endoscopy is recommended for further evaluation. Sigmoid diverticulosis without inflammation. Aortic Atherosclerosis (ICD10-I70.0). Electronically Signed   By: Marijo Conception, M.D.   On: 11/09/2018 15:53   US Paracentesis  Result Date: 11/10/2018 INDICATION: 62 year old with ascites. EXAM: ULTRASOUND-GUIDED PARACENTESIS COMPARISON:  None. MEDICATIONS: Nonene. COMPLICATIONS: None immediate. TECHNIQUE: Verbal consent was obtained from the patient's husband after a discussion of the risks, benefits and alternatives to treatment. A timeout was performed prior to the initiation of the procedure. Initial ultrasound scanning demonstrates a large amount of ascites within the right lower abdominal quadrant. The right lower abdomen was prepped and draped in the usual sterile fashion. 1% lidocaine was used for local anesthesia. Under direct ultrasound guidance, a Safe-T-Centesis catheter was introduced. An ultrasound image was saved for documentation purposed. The paracentesis was performed. The catheter was removed and a dressing was applied. The patient tolerated the procedure well without immediate post procedural complication. FINDINGS: A total of approximately 5 L liters of yellow fluid was removed. Samples were sent to the laboratory as requested by the clinical team. IMPRESSION: Successful ultrasound-guided paracentesis yielding 5 liters of peritoneal fluid. Electronically Signed   By: Markus Daft M.D.   On: 11/10/2018 13:11   Dg Abdomen Acute W/chest  Result Date: 11/09/2018 CLINICAL DATA:  Altered mental status.  Distended abdomen. EXAM: DG ABDOMEN ACUTE W/ 1V CHEST  COMPARISON:  10/25/2018 FINDINGS: There are multiple distended fluid and air-filled loops of small intestine suggesting small-bowel obstruction or ileus. Loops are centrally located suggesting the presence of ascites. No sign of free air. No significant bone finding. One-view chest shows normal heart and mediastinal shadows with clear lungs. No effusions. No free air seen under the diaphragm. IMPRESSION: Abnormal small bowel gas pattern that could be due to small-bowel obstruction or ileus. Central location suggests the presence of ascites. Electronically Signed   By: Nelson Chimes M.D.   On: 11/09/2018 14:56    EKG:   Orders placed or performed during the hospital encounter of 11/09/18  . EKG 12-Lead  . EKG 12-Lead  . ED EKG  . ED EKG    ASSESSMENT AND PLAN:   62 y/o atrial fibrillation not on anticoagulation, smoking and alcohol, liver cirrhosis presents to hospital secondary to confusion and abdominal distention  1.  Liver cirrhosis with ascites-ultrasound-guided tap requested -Low blood pressure, started albumin -Receiving fluids but will avoid unless blood pressure is very low  2.  Acute encephalopathy-ammonia is within normal limits. -Alcohol level is negative.  Continue to monitor closely -Still has hyponatremia.   -Continue IV fluids. -Renal ultrasound ordered  3.  Acute renal failure-not sure if this is hepatorenal syndrome.  Continue albumin, -Renal ultrasound and nephrology consult requested -Foley catheter ordered  4.  A. fib-rate controlled with amiodarone and Cardizem. -Not a candidate for long-term anticoagulation  5.  Tobacco use disorder-on nicotine patch  6.  DVT prophylaxis-teds and SCDs   All the records are reviewed and case discussed with Care Management/Social Workerr. Management plans discussed with the patient, family and they are in agreement.  CODE STATUS: Full Code  TOTAL TIME TAKING CARE OF THIS PATIENT: 38 minutes.   POSSIBLE D/C IN 2 DAYS,  DEPENDING ON CLINICAL CONDITION.   Gladstone Lighter M.D on 11/10/2018 at 1:16 PM  Between 7am to 6pm - Pager - [phone]  After  6pm go to [email or web address] - password [email or web address] [facility]  Office  [phone]  CC: Primary care physician; [facility]

## 2018-11-10 NOTE — Progress Notes (Signed)
lovenox decreased to 30mg  q 24hr due to crcl <24ml/min  Brance Dartt D Tanica Gaige, Pharm.D, BCPS Clinical Pharmacist

## 2018-11-10 NOTE — Care Management (Signed)
Patient admitted for AMS.  Unable to complete assessment with patient due to AMS.  RNCM has called spouse and was unable to reach.    Per previous assessment patient has a history of alcohol abuse.  Patient lives with her husband in an extended stay hotel.    On 10/27/18 patient was provided with a 30day eliquis coupon  On 10/29/18 a charity home health referral was accepted by [facility].     Per Corene Cornea with [facility] case was never opened due patient not having a PCP, unable to reach by phone, and needs beyond home care.  RNCM contacted [employer].  Per clinic patient is active and PCP is Lelon Huh, last seen on 09/04/18. This information was provided to [facility] with [facility]  RN to order PT when patient mental status improve.   RNCM following for needs.  Would need to confirm working phone number and address prior to discharge.

## 2018-11-10 NOTE — Plan of Care (Signed)
The patient has had low urine output. MD has been notified. Albumin given as well as bolus fluid of NS of 250ml. The patient has been disoriented x4 and currently disoriented x3. Paracenteses performed and 5L of fluid removed. CIWA assessed Q6. Soft BP's through out the day. CT scan of the head performed do to being drowsy/lethargic. Urology has placed a new foley catheter today. Monitoring urine output over the night.   Problem: Education: Goal: Knowledge of General Education information will improve Description Including pain rating scale, medication(s)/side effects and non-pharmacologic comfort measures Outcome: Progressing   Problem: Health Behavior/Discharge Planning: Goal: Ability to manage health-related needs will improve Outcome: Progressing   Problem: Clinical Measurements: Goal: Ability to maintain clinical measurements within normal limits will improve Outcome: Progressing Goal: Will remain free from infection Outcome: Progressing Goal: Diagnostic test results will improve Outcome: Progressing Goal: Respiratory complications will improve Outcome: Progressing Goal: Cardiovascular complication will be avoided Outcome: Progressing   Problem: Activity: Goal: Risk for activity intolerance will decrease Outcome: Progressing   Problem: Nutrition: Goal: Adequate nutrition will be maintained Outcome: Progressing   Problem: Coping: Goal: Level of anxiety will decrease Outcome: Progressing   Problem: Elimination: Goal: Will not experience complications related to bowel motility Outcome: Progressing Goal: Will not experience complications related to urinary retention Outcome: Progressing   Problem: Pain Managment: Goal: General experience of comfort will improve Outcome: Progressing   Problem: Safety: Goal: Ability to remain free from injury will improve Outcome: Progressing   Problem: Skin Integrity: Goal: Risk for impaired skin integrity will decrease Outcome:  Progressing

## 2018-11-10 NOTE — Progress Notes (Signed)
UROLOGY PROGRESS NOTE  I was asked to see [name] for low urine output/non-draining Foley in the setting of acute renal failure and ascites.  She had a renal ultrasound today with a catheter in place that showed Foley within the bladder which was decompressed, with no hydronephrosis.  The catheter has since been removed.  She also had ascites with paracentesis today of 5 L.  On exam there is a normal appearing urethral meatus.  Patient was prepped and draped in standard sterile fashion, and a 20 [place] two-way Foley passed easily into the bladder with return of amber urine.  10 cc were placed in the balloon.  15 cc of urine drained.  I then irrigated the catheter with a total of 200 cc of normal saline and the catheter irrigated easily.  It was connected to dependent drainage and secured to the leg.  -Continue work-up with primary team -Okay to maintain Foley for accurate I's and O's -Suspect prior catheter was in the correct place, she is just oliguric with ascites making bladder scan elevated -Call if questions  Nickolas Madrid, MD 11/10/2018

## 2018-11-10 NOTE — Consult Note (Signed)
Jonathon Bellows , MD [address] [place], [place], [place], [address] [address], [place], [place], [place], [address] Phone: [phone]  Fax: [phone]  Consultation  Referring Provider: DR Tressia Miners Primary Care Physician:  [place] Primary Gastroenterologist:  None          Reason for Consultation:     Liver cirrhosis   Date of Admission:  11/09/2018 Date of Consultation:  11/10/2018         HPI:   [name] is a 62 y.o. female with a known history of alcohol abuse presented to the emergency room with confusion.  He was noted to have a distended abdomen.  Last drink of alcohol was 2 days back.  CT scan of the abdomen showed cirrhosis with severe ascites, thickening of the distal esophagus and significant wall and fold thickening of the proximal stomach concerning for esophagitis or gastritis.  Sigmoid colon diverticulosis.   Labs 11/10/2018 hemoglobin 11.9 g with an MCV of 97.8.  Creatinine 2.54 , INR of 0.97 Albumin of 2 AST of 48 ALT of 24 alkaline phosphatase of 115 and total bilirubin of 1.2.   I went into the room to see the patient she appeared drowsy and confused could not provide any history did not appear uncomfortable.  Discussed with nursing she has been confused since admission. Past Medical History:  Diagnosis Date  . Cancer ([place])    skin  . Depression   . ETOH abuse     Past Surgical History:  Procedure Laterality Date  . MOLE REMOVAL      Prior to Admission medications   Medication Sig Start Date End Date Taking? Authorizing Provider  diltiazem (CARDIZEM CD) 120 MG 24 hr capsule Take 1 capsule (120 mg total) by mouth daily. 05/22/18  Yes Vaughan Basta, MD  feeding supplement, ENSURE ENLIVE, (ENSURE ENLIVE) LIQD Take 237 mLs by mouth 2 (two) times daily between meals. 10/29/18  Yes Salary, Avel Peace, MD  midodrine (PROAMATINE) 5 MG tablet Take 1 tablet (5 mg total) by mouth 3 (three) times daily with meals.  10/29/18  Yes Salary, Montell D, MD  thiamine 100 MG tablet Take 1 tablet (100 mg total) by mouth daily. 10/29/18  Yes Salary, Avel Peace, MD  traZODone (DESYREL) 100 MG tablet Take 100 mg by mouth at bedtime.   Yes [provider]  venlafaxine (EFFEXOR) 75 MG tablet Take 75 mg by mouth 3 (three) times daily.   Yes [provider]  amiodarone (PACERONE) 200 MG tablet Take 1 tablet (200 mg total) by mouth daily. Patient not taking: Reported on 11/09/2018 10/29/18   Salary, Avel Peace, MD  apixaban (ELIQUIS) 5 MG TABS tablet Take 1 tablet (5 mg total) by mouth 2 (two) times daily. Patient not taking: Reported on 11/09/2018 10/29/18   Salary, Avel Peace, MD  folic acid (FOLVITE) 1 MG tablet Take 1 tablet (1 mg total) by mouth daily. Patient not taking: Reported on 10/24/2018 05/22/18   Vaughan Basta, MD  Multiple Vitamin (MULTIVITAMIN WITH MINERALS) TABS tablet Take 1 tablet by mouth daily. Patient not taking: Reported on 11/09/2018 10/29/18   Salary, Holly Bodily D, MD  sodium chloride 1 g tablet Take 1 tablet (1 g total) by mouth 2 (two) times daily with a meal. Patient not taking: Reported on 11/09/2018 10/29/18   Salary, Avel Peace, MD  venlafaxine (EFFEXOR) 37.5 MG tablet Take 1 tablet (37.5 mg total) by mouth 2 (two) times daily with a meal. Patient  not taking: Reported on 11/09/2018 10/29/18   Salary, Avel Peace, MD    Family History  Problem Relation Age of Onset  . Breast cancer Neg Hx      Social History   Tobacco Use  . Smoking status: Current Every Day Smoker    Packs/day: 0.25    Types: Cigarettes  . Smokeless tobacco: Never Used  Substance Use Topics  . Alcohol use: Yes    Alcohol/week: 1.0 standard drinks    Types: 1 Shots of liquor per week  . Drug use: No    Allergies as of 11/09/2018  . (No Known Allergies)    Review of Systems:    All systems reviewed and negative except where noted in HPI.   Physical Exam:  Vital signs in last 24  hours: Temp:  [97 F (36.1 C)-97.8 F (36.6 C)] 97.8 F (36.6 C) (11/22 0210) Pulse Rate:  [75-89] 89 (11/22 0214) Resp:  [12-20] 16 (11/22 0210) BP: (71-103)/(49-73) 89/62 (11/22 0214) SpO2:  [98 %-100 %] 100 % (11/22 0214) Weight:  [65.3 kg] 65.3 kg (11/21 1321) Last BM Date: 11/09/18 General:   Awake, comfortable but confused. Head:  Normocephalic and atraumatic. Eyes:   No icterus.   Conjunctiva pink. PERRLA. Ears: Appears grossly normal. Neck:  Supple; no masses or thyroidomegaly Lungs: Respirations even and unlabored. Lungs clear to auscultation bilaterally.   No wheezes, crackles, or rhonchi.  Heart:  Regular rate and rhythm;  Without murmur, clicks, rubs or gallops Abdomen:  Soft, nondistended, nontender. Normal bowel sounds. No appreciable masses or hepatomegaly.  No rebound or guarding.  Neurologic:  Alert and oriented x3;  grossly normal neurologically. Skin:  Intact without significant lesions or rashes. Cervical Nodes:  No significant cervical adenopathy. Psych: Confused  LAB RESULTS: Recent Labs    11/09/18 1318 11/10/18 0324  WBC 9.6 12.5*  HGB 13.2 11.9*  HCT 38.8 36.0  PLT 330 324   BMET Recent Labs    11/09/18 1318 11/10/18 0324  NA 125* 126*  K 4.1 3.9  CL 93* 98  CO2 22 18*  GLUCOSE 96 78  BUN 36* 36*  CREATININE 2.52* 2.54*  CALCIUM 8.6* 7.8*   LFT Recent Labs    11/09/18 1318  PROT 5.8*  ALBUMIN 2.0*  AST 48*  ALT 24  ALKPHOS 115  BILITOT 1.2   PT/INR Recent Labs    11/09/18 1318  LABPROT 12.8  INR 0.97    STUDIES: Ct Abdomen Pelvis Wo Contrast  Result Date: 11/09/2018 CLINICAL DATA:  Abdominal distension. EXAM: CT ABDOMEN AND PELVIS WITHOUT CONTRAST TECHNIQUE: Multidetector CT imaging of the abdomen and pelvis was performed following the standard protocol without IV contrast. COMPARISON:  CT scan of September 26, 2016. FINDINGS: Lower chest: Wall thickening of distal esophagus is noted. Visualized lung bases are unremarkable.  Hepatobiliary: Small gallstone is noted. No biliary dilatation is noted. Findings consistent with hepatic cirrhosis. No definite focal abnormality seen in the liver on these unenhanced images. Pancreas: Unremarkable. No pancreatic ductal dilatation or surrounding inflammatory changes. Spleen: Appears to be significantly smaller in size compared to prior exam, but no definite focal abnormality is noted. Adrenals/Urinary Tract: Adrenal glands are unremarkable. Kidneys are normal, without renal calculi, focal lesion, or hydronephrosis. Bladder is decompressed secondary to Foley catheter. Stomach/Bowel: Severe wall thickening of proximal stomach is noted concerning for gastritis. Appendix appears normal. No evidence of bowel wall thickening, distention, or inflammatory changes. Sigmoid diverticulosis is noted without inflammation. Vascular/Lymphatic: Aortic atherosclerosis. No enlarged  abdominal or pelvic lymph nodes. Reproductive: Status post hysterectomy. No adnexal masses. Other: Severe ascites is noted.  No definite hernia is noted. Musculoskeletal: No acute or significant osseous findings. IMPRESSION: Hepatic cirrhosis is noted with severe ascites. Wall thickening of distal esophagus is noted as well as significant wall and fold thickening of proximal stomach. This is concerning for esophagitis and/or gastritis. Endoscopy is recommended for further evaluation. Sigmoid diverticulosis without inflammation. Aortic Atherosclerosis (ICD10-I70.0). Electronically Signed   By: Marijo Conception, M.D.   On: 11/09/2018 15:53   Dg Abdomen Acute W/chest  Result Date: 11/09/2018 CLINICAL DATA:  Altered mental status.  Distended abdomen. EXAM: DG ABDOMEN ACUTE W/ 1V CHEST COMPARISON:  10/25/2018 FINDINGS: There are multiple distended fluid and air-filled loops of small intestine suggesting small-bowel obstruction or ileus. Loops are centrally located suggesting the presence of ascites. No sign of free air. No significant bone  finding. One-view chest shows normal heart and mediastinal shadows with clear lungs. No effusions. No free air seen under the diaphragm. IMPRESSION: Abnormal small bowel gas pattern that could be due to small-bowel obstruction or ileus. Central location suggests the presence of ascites. Electronically Signed   By: Nelson Chimes M.D.   On: 11/09/2018 14:56      Impression / Plan:   [name] is a 62 y.o. y/o female with history of alcohol abuse.  I have been consulted for decompensated liver cirrhosis.  CT scan of the abdomen shows large quantity of ascites.  Etiology of decompensation and encephalopathy can range from constipation, GI bleed, electrolyte abnormalities, medications, SBP or other infections in the body.  Plan 1.  Rule out SBP by performing diagnostic and therapeutic paracentesis.  Can take out all the ascites with no limits.  If greater than 5 L taken out please administer 25% albumin at the dose of 8 g/L of fluid taken out.  Check the fluid for cell count, cytology, culture. 2.  Low-salt diet 3.  Stop all alcohol. 4.  Outpatient endoscopy to screen for varices and abnormality seen on the CAT scan.  Commence on PPI and continue as an outpatient. 5.  Autoimmune and liver work-up will be performed as an outpatient when seen in the clinic. 6.  High-protein diet. 7.  After paracentesis commence on Aldactone 100 mg and Lasix 40 mg once a day also to discharge once acute renal failure has resolved. 8.  Lactulose titrated to soft bowel movements per day excess lactulose can cause diarrhea, dehydration, elect light abnormalities and subsequent worsening of hepatic encephalopathy. 9.  Patient has AKI.  Will need further evaluation such as renal ultrasound to rule out any obstructive causes.  Suggest giving 25 to 50 g of albumin to determine if the creatinine improves and rule out prerenal causes.  If it does not improve we would have to consider diagnosis of hepatorenal syndrome and  subsequently would require to be commenced on octreotide, midodrine, and daily albumin infusions. 10.  If confusion persists over the next few hours despite attempt to rehydrate I would suggest to obtain a CT scan of the head as patients with alcohol abuse tend to be at high risk for subdural bleeds.    Thank you for involving me in the care of this patient.      LOS: 1 day   Jonathon Bellows, MD  11/10/2018, 10:12 AM

## 2018-11-10 NOTE — Procedures (Signed)
US guided paracentesis.  Removed 5 liters of yellow fluid.  No immediate complication.  Minimal blood loss.

## 2018-11-11 DIAGNOSIS — R188 Other ascites: Secondary | ICD-10-CM

## 2018-11-11 DIAGNOSIS — K767 Hepatorenal syndrome: Secondary | ICD-10-CM

## 2018-11-11 LAB — BASIC METABOLIC PANEL
ANION GAP: 13 (ref 5–15)
BUN: 37 mg/dL — ABNORMAL HIGH (ref 8–23)
CALCIUM: 8.1 mg/dL — AB (ref 8.9–10.3)
CO2: 17 mmol/L — AB (ref 22–32)
Chloride: 101 mmol/L (ref 98–111)
Creatinine, Ser: 2.6 mg/dL — ABNORMAL HIGH (ref 0.44–1.00)
GFR calc Af Amer: 22 mL/min — ABNORMAL LOW (ref >60)
GFR calc non Af Amer: 19 mL/min — ABNORMAL LOW (ref >60)
GLUCOSE: 64 mg/dL — AB (ref 70–99)
Potassium: 3.4 mmol/L — ABNORMAL LOW (ref 3.5–5.1)
Sodium: 131 mmol/L — ABNORMAL LOW (ref 135–145)

## 2018-11-11 LAB — SODIUM, URINE, RANDOM

## 2018-11-11 LAB — TRIGLYCERIDES, BODY FLUIDS: Triglycerides, Fluid: 47 mg/dL

## 2018-11-11 LAB — PROTEIN, BODY FLUID (OTHER): TOTAL PROTEIN, BODY FLUID OTHER: 0.9 g/dL

## 2018-11-11 MED ORDER — OCTREOTIDE ACETATE 100 MCG/ML IJ SOLN
100.0000 ug | Freq: Three times a day (TID) | INTRAMUSCULAR | Status: DC
  Administered 2018-11-11 – 2018-11-20 (×23): 100 ug via SUBCUTANEOUS
  Filled 2018-11-11 (×39): qty 1

## 2018-11-11 MED ORDER — MIDODRINE HCL 5 MG PO TABS: 5.0000 mg | ORAL_TABLET | Freq: Three times a day (TID) | ORAL | Status: DC

## 2018-11-11 MED ORDER — SODIUM BICARBONATE 8.4 % IV SOLN
INTRAVENOUS | Status: DC
  Administered 2018-11-11 – 2018-11-12 (×2): via INTRAVENOUS
  Filled 2018-11-11 (×5): qty 150

## 2018-11-11 MED ORDER — POTASSIUM CHLORIDE CRYS ER 20 MEQ PO TBCR
20.0000 meq | EXTENDED_RELEASE_TABLET | Freq: Once | ORAL | Status: AC
  Administered 2018-11-11: 20 meq via ORAL
  Filled 2018-11-11: qty 1

## 2018-11-11 NOTE — Progress Notes (Addendum)
[facility] [place] NAME: Christmas Faraci    MR#:  [id number]  DATE OF BIRTH:  18-Apr-1956  SUBJECTIVE:  CHIEF COMPLAINT:   Chief Complaint  Patient presents with  . Altered Mental Status  Patient seen today Is awake and alert and responds to all verbal commands Decreased confusion Had a large volume paracentesis yesterday  REVIEW OF SYSTEMS:    ROS  CONSTITUTIONAL: No documented fever. No fatigue, weakness. No weight gain, no weight loss.  EYES: No blurry or double vision.  ENT: No tinnitus. No postnasal drip. No redness of the oropharynx.  RESPIRATORY: No cough, no wheeze, no hemoptysis. No dyspnea.  CARDIOVASCULAR: No chest pain. No orthopnea. No palpitations. No syncope.  GASTROINTESTINAL: No nausea, no vomiting or diarrhea. No abdominal pain. No melena or hematochezia.  GENITOURINARY: No dysuria or hematuria.  ENDOCRINE: No polyuria or nocturia. No heat or cold intolerance.  HEMATOLOGY: No anemia. No bruising. No bleeding.  INTEGUMENTARY: No rashes. No lesions.  MUSCULOSKELETAL: No arthritis. No swelling. No gout.  NEUROLOGIC: No numbness, tingling, or ataxia. No seizure-type activity.  PSYCHIATRIC: No anxiety. No insomnia. No ADD.   DRUG ALLERGIES:  No Known Allergies  VITALS:  Blood pressure (!) 88/55, pulse 86, temperature 97.6 F (36.4 C), temperature source Oral, resp. rate 17, height 5\' 9"  (1.753 m), weight 65.3 kg, SpO2 99 %.  PHYSICAL EXAMINATION:   Physical Exam  GENERAL:  62 y.o.-year-old patient lying in the bed with no acute distress.  EYES: Pupils equal, round, reactive to light and accommodation. No scleral icterus. Extraocular muscles intact.  HEENT: Head atraumatic, normocephalic. Oropharynx and nasopharynx clear.  NECK:  Supple, no jugular venous distention. No thyroid enlargement, no tenderness.  LUNGS: Normal breath sounds bilaterally, no wheezing, rales, rhonchi. No use of accessory muscles of  respiration.  CARDIOVASCULAR: S1, S2 normal. No murmurs, rubs, or gallops.  ABDOMEN: Soft, nontender, decreased distension. Bowel sounds present. No organomegaly or mass.  EXTREMITIES: No cyanosis, clubbing  1 plus edema b/l.    NEUROLOGIC: Cranial nerves II through XII are intact. No focal Motor or sensory deficits b/l.   PSYCHIATRIC: The patient is alert and oriented x 2.  SKIN: No obvious rash, lesion, or ulcer.   LABORATORY PANEL:   CBC Recent Labs  Lab 11/10/18 0324  WBC 12.5*  HGB 11.9*  HCT 36.0  PLT 324   ------------------------------------------------------------------------------------------------------------------ Chemistries  Recent Labs  Lab 11/09/18 1318  11/11/18 0531  NA 125*   < > 131*  K 4.1   < > 3.4*  CL 93*   < > 101  CO2 22   < > 17*  GLUCOSE 96   < > 64*  BUN 36*   < > 37*  CREATININE 2.52*   < > 2.60*  CALCIUM 8.6*   < > 8.1*  AST 48*  --   --   ALT 24  --   --   ALKPHOS 115  --   --   BILITOT 1.2  --   --    < > = values in this interval not displayed.   ------------------------------------------------------------------------------------------------------------------  Cardiac Enzymes Recent Labs  Lab 11/09/18 1318  TROPONINI <0.03   ------------------------------------------------------------------------------------------------------------------  RADIOLOGY:  Ct Abdomen Pelvis Wo Contrast  Result Date: 11/09/2018 CLINICAL DATA:  Abdominal distension. EXAM: CT ABDOMEN AND PELVIS WITHOUT CONTRAST TECHNIQUE: Multidetector CT imaging of the abdomen and pelvis was performed following the standard protocol without IV contrast. COMPARISON:  CT scan  of September 26, 2016. FINDINGS: Lower chest: Wall thickening of distal esophagus is noted. Visualized lung bases are unremarkable. Hepatobiliary: Small gallstone is noted. No biliary dilatation is noted. Findings consistent with hepatic cirrhosis. No definite focal abnormality seen in the liver on these  unenhanced images. Pancreas: Unremarkable. No pancreatic ductal dilatation or surrounding inflammatory changes. Spleen: Appears to be significantly smaller in size compared to prior exam, but no definite focal abnormality is noted. Adrenals/Urinary Tract: Adrenal glands are unremarkable. Kidneys are normal, without renal calculi, focal lesion, or hydronephrosis. Bladder is decompressed secondary to Foley catheter. Stomach/Bowel: Severe wall thickening of proximal stomach is noted concerning for gastritis. Appendix appears normal. No evidence of bowel wall thickening, distention, or inflammatory changes. Sigmoid diverticulosis is noted without inflammation. Vascular/Lymphatic: Aortic atherosclerosis. No enlarged abdominal or pelvic lymph nodes. Reproductive: Status post hysterectomy. No adnexal masses. Other: Severe ascites is noted.  No definite hernia is noted. Musculoskeletal: No acute or significant osseous findings. IMPRESSION: Hepatic cirrhosis is noted with severe ascites. Wall thickening of distal esophagus is noted as well as significant wall and fold thickening of proximal stomach. This is concerning for esophagitis and/or gastritis. Endoscopy is recommended for further evaluation. Sigmoid diverticulosis without inflammation. Aortic Atherosclerosis (ICD10-I70.0). Electronically Signed   By: Marijo Conception, M.D.   On: 11/09/2018 15:53   Ct Head Wo Contrast  Result Date: 11/10/2018 CLINICAL DATA:  Altered mental status x2 days. EXAM: CT HEAD WITHOUT CONTRAST TECHNIQUE: Contiguous axial images were obtained from the base of the skull through the vertex without intravenous contrast. COMPARISON:  None. FINDINGS: BRAIN: There is mild sulcal prominence consistent with superficial atrophy. No hydrocephalus. No intraparenchymal hemorrhage, mass effect nor midline shift. 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 No acute large vascular territory  infarcts. No abnormal extra-axial fluid collections. Basal cisterns are not effaced and midline. Mild cerebellar atrophy. Brainstem and cerebellum are nonacute. VASCULAR: No hyperdense vessel sign or unexpected calcifications. SKULL: No skull fracture. No significant scalp soft tissue swelling. SINUSES/ORBITS: The mastoid air-cells are clear. The included paranasal sinuses are well-aerated.The included ocular globes and orbital contents are non-suspicious. OTHER: None. IMPRESSION: 1. 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 2. No acute intracranial abnormality. 3. Age related involutional changes of the brain. Electronically Signed   By: Ashley Royalty M.D.   On: 11/10/2018 15:41   US Renal  Result Date: 11/10/2018 CLINICAL DATA:  Acute renal failure. EXAM: RENAL / URINARY TRACT ULTRASOUND COMPLETE COMPARISON:  CT scan of November 09, 2018. FINDINGS: Right Kidney: Renal measurements: 10.0 x 5.3 x 5.0 cm = volume: 138 mL. Probable 1.9 cm exophytic cyst is seen arising from upper pole. Mildly increased echogenicity of renal parenchyma is noted. No mass or hydronephrosis visualized. Left Kidney: Renal measurements: 9.5 x 4.2 x 3.9 cm = volume: 81 mL. Mildly increased echogenicity of renal parenchyma is noted. No mass or hydronephrosis visualized. Bladder: Decompressed secondary to Foley catheter.  Mild ascites is noted. IMPRESSION: Mildly increased echogenicity of renal parenchyma is noted bilaterally suggesting medical renal disease. No hydronephrosis or renal obstruction is noted. Ascites is noted. Electronically Signed   By: Marijo Conception, M.D.   On: 11/10/2018 13:33   US Paracentesis  Result Date: 11/10/2018 INDICATION: 62 year old with ascites. EXAM: ULTRASOUND-GUIDED PARACENTESIS COMPARISON:  None. MEDICATIONS: Nonene. COMPLICATIONS: None immediate. TECHNIQUE: Verbal consent was obtained from the patient's husband after a discussion of the  risks, benefits and alternatives to treatment. A timeout  was performed prior to the initiation of the procedure. Initial ultrasound scanning demonstrates a large amount of ascites within the right lower abdominal quadrant. The right lower abdomen was prepped and draped in the usual sterile fashion. 1% lidocaine was used for local anesthesia. Under direct ultrasound guidance, a Safe-T-Centesis catheter was introduced. An ultrasound image was saved for documentation purposed. The paracentesis was performed. The catheter was removed and a dressing was applied. The patient tolerated the procedure well without immediate post procedural complication. FINDINGS: A total of approximately 5 L liters of yellow fluid was removed. Samples were sent to the laboratory as requested by the clinical team. IMPRESSION: Successful ultrasound-guided paracentesis yielding 5 liters of peritoneal fluid. Electronically Signed   By: Markus Daft M.D.   On: 11/10/2018 13:11   Dg Abdomen Acute W/chest  Result Date: 11/09/2018 CLINICAL DATA:  Altered mental status.  Distended abdomen. EXAM: DG ABDOMEN ACUTE W/ 1V CHEST COMPARISON:  10/25/2018 FINDINGS: There are multiple distended fluid and air-filled loops of small intestine suggesting small-bowel obstruction or ileus. Loops are centrally located suggesting the presence of ascites. No sign of free air. No significant bone finding. One-view chest shows normal heart and mediastinal shadows with clear lungs. No effusions. No free air seen under the diaphragm. IMPRESSION: Abnormal small bowel gas pattern that could be due to small-bowel obstruction or ileus. Central location suggests the presence of ascites. Electronically Signed   By: Nelson Chimes M.D.   On: 11/09/2018 14:56     ASSESSMENT AND PLAN:  62 year old female patient with history of tobacco abuse, alcohol abuse, liver cirrhosis, atrial fibrillation currently under hospitalist service for altered mental status and abdominal  distention  -Decompensated liver disease with ascites status post large volume paracentesis Status post GI evaluation Continue albumin On octreotide  -Acute encephalopathy probably hepatic Ammonia is normal Improving slowly  -Hyponatremia Improving with IV fluids and p.o. Intake Appreciate nephrology follow-up  -Metabolic acidosis On IV sodium bicarbonate fluids  -Acute renal failure Is oliguric with ascites On Foley catheter Probably from hepatorenal syndrome Follow-up renal ultrasound  -Chronic atrial fibrillation Rate controlled with amiodarone and Cardizem Not a candidate for anticoagulation  -Tobacco abuse On nicotine patch  -DVT prophylaxis with sequential compression device to lower extremities   All the records are reviewed and case discussed with Care Management/Social Worker. Management plans discussed with the patient, family and they are in agreement.  CODE STATUS: Full code  DVT Prophylaxis: SCDs  TOTAL TIME TAKING CARE OF THIS PATIENT: 35 minutes.   POSSIBLE D/C IN 2 to 3 DAYS, DEPENDING ON CLINICAL CONDITION.  [name] M.D on 11/11/2018 at 1:57 PM  Between 7am to 6pm - Pager - [phone]  After 6pm go to [email or web address] - password [email or web address]  Office  [phone]  CC: Primary care physician; [place]  Note: This dictation was prepared with Dragon dictation along with smaller Company secretary. Any transcriptional errors that result from this process are unintentional.

## 2018-11-11 NOTE — Progress Notes (Signed)
Pt is appropriate for short term work to increase mobility and strength, but will be resistant to working with therapist.  Follow acutely for progression of mobility to standing and to be in the chair, to transition to walker and walk longer trips to increase safety and reduce fall risk.   11/11/18 1300  PT Visit Information  Last PT Received On 11/11/18  Assistance Needed +2  History of Present Illness 62 yo female with onset of AMS had 5L fluid removed in paracentesis on 11/22, now referred to PT.  Her husband is not present for eval but did call hosp during the PT visit to ask for a number, pt could not tell it.  Pt admitted for general weakness, at baseline was wearing paper undergarments and not clear if she was ambulatory.  PMHx:  recent admission for sepsis UTI, skin CA, cirrhosis, depression, EtOH abuse, tobacco abuse,   Precautions  Precautions Fall (telemetry)  Required Braces or Orthoses Other Brace/Splint  Other Brace/Splint soft boots to protect heels  Restrictions  Weight Bearing Restrictions No  Home Living  Family/patient expects to be discharged to: Private residence  Living Arrangements Spouse/significant other  Available Help at Discharge Family  Type of [place] (universal access apt)  Home Access Level entry  [place] One level  Bathroom Shower/Tub Tub/shower unit  [place] BSC;Cane - single point;Tub bench;Hand held shower head;Walker - 2 wheels  Additional Comments mult falls in recent history  Prior Function  Level of [place] Needs assistance  Gait / Transfers Assistance Needed pt was not able to tell PT specifics about gait but was using RW per her report  ADL's / [place] home with husband, not clear how much pt was doing  Communication / Swallowing Assistance Needed no diff  Comments previous admission pt reported not walking recently but contradicting  herself now  Communication  Communication No difficulties  Pain Assessment  Pain Assessment No/denies pain  Cognition  Arousal/Alertness Awake/alert  Behavior During Therapy Impulsive;Agitated  Overall Cognitive Status History of cognitive impairments - at baseline  General Comments Pt has history of falls and is unable to give PT a history of her use of AD prior to arrival  Upper Extremity Assessment  Upper Extremity Assessment Generalized weakness  Lower Extremity Assessment  Lower Extremity Assessment Generalized weakness  Cervical / Trunk Assessment  Cervical / Trunk Assessment Kyphotic  Bed Mobility  Overal bed mobility Needs Assistance  Bed Mobility Sidelying to Sit;Sit to Sidelying;Rolling  Rolling Mod assist  Sidelying to sit Max assist  Sit to supine Max assist  Sit to sidelying Max assist  Transfers  General transfer comment pt became severely agitated and PT could not safety assist her to stand  Balance  Overall balance assessment Needs assistance;History of Falls  Sitting-balance support Feet supported;Bilateral upper extremity supported  Sitting balance-Leahy Scale Fair  General Comments  General comments (skin integrity, edema, etc.) pt is unable to give a coherent history and refused to answer questions about home  PT - End of Session  Activity Tolerance Treatment limited secondary to agitation  Patient left in bed;with call bell/phone within reach;with bed alarm set;with nursing/sitter in room  Nurse Communication Mobility status  PT Assessment  PT Recommendation/Assessment Patient needs continued PT services  PT Visit Diagnosis Muscle weakness (generalized) (M62.81);History of falling (Z91.81);Other abnormalities of gait and mobility (R26.89)  PT Problem List Decreased strength;Decreased range of motion;Decreased activity tolerance;Decreased balance;Pain;Decreased mobility;Decreased  coordination;Decreased cognition;Decreased knowledge of use of DME;Decreased  safety awareness  Barriers to Discharge Decreased caregiver support  PT Plan  PT Frequency (ACUTE ONLY) Min 2X/week  PT Treatment/Interventions (ACUTE ONLY) DME instruction;Gait training;Stair training;Functional mobility training;Balance training;Therapeutic exercise;Therapeutic activities;Neuromuscular re-education;Patient/family education  AM-PAC PT "6 Clicks" Mobility Outcome Measure (Version 2)  Help needed turning from your back to your side while in a flat bed without using bedrails? 1  Help needed moving from lying on your back to sitting on the side of a flat bed without using bedrails? 1  Help needed moving to and from a bed to a chair (including a wheelchair)? 1  Help needed standing up from a chair using your arms (e.g., wheelchair or bedside chair)? 1  Help needed to walk in hospital room? 1  Help needed climbing 3-5 steps with a railing?  1  6 Click Score 6  Consider Recommendation of Discharge To: CIR/SNF/LTACH  PT Recommendation  Follow Up Recommendations SNF  PT equipment Rolling walker with 5" wheels;Other (comment)  Individuals Consulted  Consulted and Agree with Results and Recommendations Patient  Acute Rehab PT Goals  Patient Stated Goal Pt wants to walk again  PT Goal Formulation Patient unable to participate in goal setting  Time For Goal Achievement 11/25/18  Potential to Achieve Goals Fair  PT Time Calculation  PT Start Time (ACUTE ONLY) 1255  PT Stop Time (ACUTE ONLY) 1323  PT Time Calculation (min) (ACUTE ONLY) 28 min  PT General Charges  $$ ACUTE PT VISIT 1 Visit  PT Evaluation  $PT Eval Moderate Complexity 1 Mod  PT Treatments  $Therapeutic Activity 8-22 mins  Written Expression  Dominant Hand Right   Mee Hives, PT MS Acute Rehab Dept. Number: [place] [facility] [place]

## 2018-11-11 NOTE — Progress Notes (Signed)
Jonathon Bellows , MD [address] [place], [place], [place], [address] [address], [place], [place], [place], [address] Phone: [phone]  Fax: [phone]   Ludean Duhart is being followed for hepatic encephelopathy and possible HRS  Subjective: Nausea but no abdominal pain   Objective: Vital signs in last 24 hours: Vitals:   11/10/18 1830 11/10/18 2029 11/11/18 0048 11/11/18 0454  BP: (!) 80/53 (!) 87/54 (!) 90/56 90/60  Pulse: 74 82  (!) 53  Resp:  20  20  Temp:  (!) 97.5 F (36.4 C)  97.8 F (36.6 C)  TempSrc:  Oral  Oral  SpO2:  96%  96%  Weight:      Height:       Weight change:   Intake/Output Summary (Last 24 hours) at 11/11/2018 1043 Last data filed at 11/11/2018 1034 Gross per 24 hour  Intake 1498.33 ml  Output 15 ml  Net 1483.33 ml     Exam: Heart:: Regular rate and rhythm, S1S2 present or without murmur or extra heart sounds Lungs: normal, clear to auscultation and clear to auscultation and percussion Abdomen: soft, nontender, normal bowel sounds   Lab Results: @LABTEST2 @ Micro Results: Recent Results (from the past 240 hour(s))  Body fluid culture     Status: None (Preliminary result)   Collection Time: 11/10/18 12:19 PM  Result Value Ref Range Status   Specimen Description   Final    PERITONEAL Performed at [facility], [address], [place], [place] [address]    Special Requests   Final    NONE Performed at [facility], [address] [place], [place] [address]    Gram Stain   Final    WBC PRESENT,BOTH PMN AND MONONUCLEAR NO ORGANISMS SEEN CYTOSPIN SMEAR    Culture   Final    NO GROWTH < 12 HOURS Performed at [facility], [place] [address] [place],  [address]    Report Status PENDING  Incomplete   Studies/Results: Ct Abdomen Pelvis Wo Contrast  Result Date: 11/09/2018 CLINICAL DATA:  Abdominal distension. EXAM: CT ABDOMEN AND PELVIS WITHOUT CONTRAST TECHNIQUE: Multidetector CT imaging  of the abdomen and pelvis was performed following the standard protocol without IV contrast. COMPARISON:  CT scan of September 26, 2016. FINDINGS: Lower chest: Wall thickening of distal esophagus is noted. Visualized lung bases are unremarkable. Hepatobiliary: Small gallstone is noted. No biliary dilatation is noted. Findings consistent with hepatic cirrhosis. No definite focal abnormality seen in the liver on these unenhanced images. Pancreas: Unremarkable. No pancreatic ductal dilatation or surrounding inflammatory changes. Spleen: Appears to be significantly smaller in size compared to prior exam, but no definite focal abnormality is noted. Adrenals/Urinary Tract: Adrenal glands are unremarkable. Kidneys are normal, without renal calculi, focal lesion, or hydronephrosis. Bladder is decompressed secondary to Foley catheter. Stomach/Bowel: Severe wall thickening of proximal stomach is noted concerning for gastritis. Appendix appears normal. No evidence of bowel wall thickening, distention, or inflammatory changes. Sigmoid diverticulosis is noted without inflammation. Vascular/Lymphatic: Aortic atherosclerosis. No enlarged abdominal or pelvic lymph nodes. Reproductive: Status post hysterectomy. No adnexal masses. Other: Severe ascites is noted.  No definite hernia is noted. Musculoskeletal: No acute or significant osseous findings. IMPRESSION: Hepatic cirrhosis is noted with severe ascites. Wall thickening of distal esophagus is noted as well as significant wall and fold thickening of proximal stomach. This is concerning for esophagitis and/or gastritis. Endoscopy is recommended for further evaluation. Sigmoid diverticulosis without inflammation. Aortic Atherosclerosis (ICD10-I70.0). Electronically Signed  By: Marijo Conception, M.D.   On: 11/09/2018 15:53   Ct Head Wo Contrast  Result Date: 11/10/2018 CLINICAL DATA:  Altered mental status x2 days. EXAM: CT HEAD WITHOUT CONTRAST TECHNIQUE: Contiguous axial images  were obtained from the base of the skull through the vertex without intravenous contrast. COMPARISON:  None. FINDINGS: BRAIN: There is mild sulcal prominence consistent with superficial atrophy. No hydrocephalus. No intraparenchymal hemorrhage, mass effect nor midline shift. 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 No acute large vascular territory infarcts. No abnormal extra-axial fluid collections. Basal cisterns are not effaced and midline. Mild cerebellar atrophy. Brainstem and cerebellum are nonacute. VASCULAR: No hyperdense vessel sign or unexpected calcifications. SKULL: No skull fracture. No significant scalp soft tissue swelling. SINUSES/ORBITS: The mastoid air-cells are clear. The included paranasal sinuses are well-aerated.The included ocular globes and orbital contents are non-suspicious. OTHER: None. IMPRESSION: 1. 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 2. No acute intracranial abnormality. 3. Age related involutional changes of the brain. Electronically Signed   By: Ashley Royalty M.D.   On: 11/10/2018 15:41   US Renal  Result Date: 11/10/2018 CLINICAL DATA:  Acute renal failure. EXAM: RENAL / URINARY TRACT ULTRASOUND COMPLETE COMPARISON:  CT scan of November 09, 2018. FINDINGS: Right Kidney: Renal measurements: 10.0 x 5.3 x 5.0 cm = volume: 138 mL. Probable 1.9 cm exophytic cyst is seen arising from upper pole. Mildly increased echogenicity of renal parenchyma is noted. No mass or hydronephrosis visualized. Left Kidney: Renal measurements: 9.5 x 4.2 x 3.9 cm = volume: 81 mL. Mildly increased echogenicity of renal parenchyma is noted. No mass or hydronephrosis visualized. Bladder: Decompressed secondary to Foley catheter.  Mild ascites is noted. IMPRESSION: Mildly increased echogenicity of renal parenchyma is noted bilaterally suggesting medical renal disease. No hydronephrosis or  renal obstruction is noted. Ascites is noted. Electronically Signed   By: Marijo Conception, M.D.   On: 11/10/2018 13:33   US Paracentesis  Result Date: 11/10/2018 INDICATION: 62 year old with ascites. EXAM: ULTRASOUND-GUIDED PARACENTESIS COMPARISON:  None. MEDICATIONS: Nonene. COMPLICATIONS: None immediate. TECHNIQUE: Verbal consent was obtained from the patient's husband after a discussion of the risks, benefits and alternatives to treatment. A timeout was performed prior to the initiation of the procedure. Initial ultrasound scanning demonstrates a large amount of ascites within the right lower abdominal quadrant. The right lower abdomen was prepped and draped in the usual sterile fashion. 1% lidocaine was used for local anesthesia. Under direct ultrasound guidance, a Safe-T-Centesis catheter was introduced. An ultrasound image was saved for documentation purposed. The paracentesis was performed. The catheter was removed and a dressing was applied. The patient tolerated the procedure well without immediate post procedural complication. FINDINGS: A total of approximately 5 L liters of yellow fluid was removed. Samples were sent to the laboratory as requested by the clinical team. IMPRESSION: Successful ultrasound-guided paracentesis yielding 5 liters of peritoneal fluid. Electronically Signed   By: Markus Daft M.D.   On: 11/10/2018 13:11   Dg Abdomen Acute W/chest  Result Date: 11/09/2018 CLINICAL DATA:  Altered mental status.  Distended abdomen. EXAM: DG ABDOMEN ACUTE W/ 1V CHEST COMPARISON:  10/25/2018 FINDINGS: There are multiple distended fluid and air-filled loops of small intestine suggesting small-bowel obstruction or ileus. Loops are centrally located suggesting the presence of ascites. No sign of free air. No significant bone finding. One-view chest shows normal heart and mediastinal shadows with clear lungs. No effusions. No free air seen  under the diaphragm. IMPRESSION: Abnormal small bowel gas  pattern that could be due to small-bowel obstruction or ileus. Central location suggests the presence of ascites. Electronically Signed   By: Nelson Chimes M.D.   On: 11/09/2018 14:56   Medications: I have reviewed the patient's current medications. Scheduled Meds: . enoxaparin (LOVENOX) injection  30 mg Subcutaneous Q24H  . feeding supplement (ENSURE ENLIVE)  237 mL Oral BID BM  . folic acid  1 mg Oral Daily  . LORazepam  0-4 mg Intravenous Q6H   Followed by  . LORazepam  0-4 mg Intravenous Q12H  . midodrine  5 mg Oral TID WC  . multivitamin with minerals  1 tablet Oral Daily  . nicotine  21 mg Transdermal Daily  . sodium chloride  1 g Oral BID WC  . thiamine  100 mg Oral Daily   Or  . thiamine  100 mg Intravenous Daily  . venlafaxine  37.5 mg Oral BID WC   Continuous Infusions: . sodium chloride 75 mL/hr at 11/11/18 0334  . albumin human 25 g (11/11/18 0948)  . cefTRIAXone (ROCEPHIN)  IV 1 g (11/10/18 1623)   PRN Meds:.acetaminophen **OR** acetaminophen, guaiFENesin-dextromethorphan, LORazepam **OR** LORazepam, ondansetron **OR** ondansetron (ZOFRAN) IV, senna-docusate   Assessment: Active Problems:   Renal failure  Zaley Talley is a 62 y.o. y/o female with history of alcohol abuse.  I have been consulted for decompensated liver cirrhosis.  CT scan of the abdomen shows large quantity of ascites.  Etiology of decompensation and encephalopathy can range from constipation, GI bleed, electrolyte abnormalities, medications, SBP or other infections in the body. Ascitic fluid analysis shows no SBP  Plan 1.   Lactulose titrated to soft bowel movements per day excess lactulose can cause diarrhea, dehydration, elect light abnormalities and subsequent worsening of hepatic encephalopathy. 2.  Low-salt diet < 2grams sodium per day  3.  Stop all alcohol. 4.  Outpatient endoscopy to screen for varices and abnormality seen on the CAT scan.  Commence on PPI and continue as an outpatient. 5.   Autoimmune and liver work-up will be performed as an outpatient when seen in the clinic. 6.  High-protein diet. 7.  AKI no better with albumin infusions suspect HRS vs ATN: commence on Midodrine  5mg  TID and can be gradually increased. Commence on octreotide 100 MCG TID,50 grams albumin daily . Suggest avoid sodium tablets as the reason for the hyponatremia is not a body deficit of sodium rather dilutional due to retention of water due to a large qty of sodium retained by excess stimulation of the renin angiotensin mechanism by decreased renal perfusion. Sodium tablets will lead to worsening of ascites.     LOS: 2 days   Jonathon Bellows, MD 11/11/2018, 10:43 AM

## 2018-11-11 NOTE — Progress Notes (Signed)
[facility]  ROUNDING NOTE   Subjective:   More alert this morning. Alert to self and time, not place.  Creatinine 2.6 (2.54)  Na 131 (126) CO2 17 (18)  NS at 54mL/hr  Objective:  Vital signs in last 24 hours:  Temp:  [97.5 F (36.4 C)-97.8 F (36.6 C)] 97.6 F (36.4 C) (11/23 1144) Pulse Rate:  [53-87] 86 (11/23 1144) Resp:  [17-20] 17 (11/23 1144) BP: (79-90)/(53-60) 88/55 (11/23 1144) SpO2:  [96 %-99 %] 99 % (11/23 1144)  Weight change:  Filed Weights   11/09/18 1321  Weight: 65.3 kg    Intake/Output: I/O last 3 completed shifts: In: 1678.3 [P.O.:300; I.V.:1287.6; IV Piggyback:90.7] Out: 215 [Urine:215]   Intake/Output this shift:  Total I/O In: 325.4 [P.O.:120; I.V.:105.4; IV Piggyback:100] Out: -   Physical Exam: General: NAD,   Head: Normocephalic, atraumatic. Moist oral mucosal membranes  Eyes: Anicteric, PERRL  Neck: Supple, trachea midline  Lungs:  Clear to auscultation  Heart: Regular rate and rhythm  Abdomen:  +ascites  Extremities:  trace peripheral edema.  Neurologic: Nonfocal, moving all four extremities  Skin: No lesions        Basic Metabolic Panel: Recent Labs  Lab 11/09/18 1318 11/10/18 0324 11/11/18 0531  NA 125* 126* 131*  K 4.1 3.9 3.4*  CL 93* 98 101  CO2 22 18* 17*  GLUCOSE 96 78 64*  BUN 36* 36* 37*  CREATININE 2.52* 2.54* 2.60*  CALCIUM 8.6* 7.8* 8.1*    Liver Function Tests: Recent Labs  Lab 11/09/18 1318  AST 48*  ALT 24  ALKPHOS 115  BILITOT 1.2  PROT 5.8*  ALBUMIN 2.0*   No results for input(s): LIPASE, AMYLASE in the last 168 hours. Recent Labs  Lab 11/09/18 1318  AMMONIA 21    CBC: Recent Labs  Lab 11/09/18 1318 11/10/18 0324  WBC 9.6 12.5*  NEUTROABS 7.2  --   HGB 13.2 11.9*  HCT 38.8 36.0  MCV 97.5 97.8  PLT 330 324    Cardiac Enzymes: Recent Labs  Lab 11/09/18 1318  TROPONINI <0.03    BNP: Invalid input(s): POCBNP  CBG: No results for input(s): GLUCAP in the  last 168 hours.  Microbiology: Results for orders placed or performed during the hospital encounter of 11/09/18  Body fluid culture     Status: None (Preliminary result)   Collection Time: 11/10/18 12:19 PM  Result Value Ref Range Status   Specimen Description   Final    PERITONEAL Performed at [facility], [address], [place], [place] [address]    Special Requests   Final    NONE Performed at [facility], [address] [place], [place] [address]    Gram Stain   Final    WBC PRESENT,BOTH PMN AND MONONUCLEAR NO ORGANISMS SEEN CYTOSPIN SMEAR    Culture   Final    NO GROWTH < 12 HOURS Performed at [facility], [place] [address], [place], [place] [address]    Report Status PENDING  Incomplete    Coagulation Studies: Recent Labs    11/09/18 1318  LABPROT 12.8  INR 0.97    Urinalysis: Recent Labs    11/09/18 1520 11/10/18 1628  [place] 1.020 1.014  PHURINE TEST NOT REPORTED DUE TO COLOR INTERFERENCE OF URINE PIGMENT 5.0  GLUCOSEU TEST NOT REPORTED DUE TO COLOR INTERFERENCE OF URINE PIGMENT* NEGATIVE  HGBUR TEST NOT REPORTED DUE TO COLOR INTERFERENCE OF URINE PIGMENT* LARGE*  BILIRUBINUR TEST NOT REPORTED DUE TO COLOR INTERFERENCE OF URINE PIGMENT* NEGATIVE  KETONESUR TEST NOT REPORTED DUE TO COLOR INTERFERENCE OF URINE PIGMENT* NEGATIVE  PROTEINUR TEST NOT REPORTED DUE TO COLOR INTERFERENCE OF URINE PIGMENT* 30*  NITRITE TEST NOT REPORTED DUE TO COLOR INTERFERENCE OF URINE PIGMENT* NEGATIVE  LEUKOCYTESUR TEST NOT REPORTED DUE TO COLOR INTERFERENCE OF URINE PIGMENT* MODERATE*      Imaging: Ct Abdomen Pelvis Wo Contrast  Result Date: 11/09/2018 CLINICAL DATA:  Abdominal distension. EXAM: CT ABDOMEN AND PELVIS WITHOUT CONTRAST TECHNIQUE: Multidetector CT imaging of the abdomen and pelvis was performed following the standard protocol without IV contrast. COMPARISON:  CT scan of September 26, 2016. FINDINGS: Lower  chest: Wall thickening of distal esophagus is noted. Visualized lung bases are unremarkable. Hepatobiliary: Small gallstone is noted. No biliary dilatation is noted. Findings consistent with hepatic cirrhosis. No definite focal abnormality seen in the liver on these unenhanced images. Pancreas: Unremarkable. No pancreatic ductal dilatation or surrounding inflammatory changes. Spleen: Appears to be significantly smaller in size compared to prior exam, but no definite focal abnormality is noted. Adrenals/Urinary Tract: Adrenal glands are unremarkable. Kidneys are normal, without renal calculi, focal lesion, or hydronephrosis. Bladder is decompressed secondary to Foley catheter. Stomach/Bowel: Severe wall thickening of proximal stomach is noted concerning for gastritis. Appendix appears normal. No evidence of bowel wall thickening, distention, or inflammatory changes. Sigmoid diverticulosis is noted without inflammation. Vascular/Lymphatic: Aortic atherosclerosis. No enlarged abdominal or pelvic lymph nodes. Reproductive: Status post hysterectomy. No adnexal masses. Other: Severe ascites is noted.  No definite hernia is noted. Musculoskeletal: No acute or significant osseous findings. IMPRESSION: Hepatic cirrhosis is noted with severe ascites. Wall thickening of distal esophagus is noted as well as significant wall and fold thickening of proximal stomach. This is concerning for esophagitis and/or gastritis. Endoscopy is recommended for further evaluation. Sigmoid diverticulosis without inflammation. Aortic Atherosclerosis (ICD10-I70.0). Electronically Signed   By: Marijo Conception, M.D.   On: 11/09/2018 15:53   Ct Head Wo Contrast  Result Date: 11/10/2018 CLINICAL DATA:  Altered mental status x2 days. EXAM: CT HEAD WITHOUT CONTRAST TECHNIQUE: Contiguous axial images were obtained from the base of the skull through the vertex without intravenous contrast. COMPARISON:  None. FINDINGS: BRAIN: There is mild sulcal  prominence consistent with superficial atrophy. No hydrocephalus. No intraparenchymal hemorrhage, mass effect nor midline shift. 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 No acute large vascular territory infarcts. No abnormal extra-axial fluid collections. Basal cisterns are not effaced and midline. Mild cerebellar atrophy. Brainstem and cerebellum are nonacute. VASCULAR: No hyperdense vessel sign or unexpected calcifications. SKULL: No skull fracture. No significant scalp soft tissue swelling. SINUSES/ORBITS: The mastoid air-cells are clear. The included paranasal sinuses are well-aerated.The included ocular globes and orbital contents are non-suspicious. OTHER: None. IMPRESSION: 1. 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 2. No acute intracranial abnormality. 3. Age related involutional changes of the brain. Electronically Signed   By: Ashley Royalty M.D.   On: 11/10/2018 15:41   US Renal  Result Date: 11/10/2018 CLINICAL DATA:  Acute renal failure. EXAM: RENAL / URINARY TRACT ULTRASOUND COMPLETE COMPARISON:  CT scan of November 09, 2018. FINDINGS: Right Kidney: Renal measurements: 10.0 x 5.3 x 5.0 cm = volume: 138 mL. Probable 1.9 cm exophytic cyst is seen arising from upper pole. Mildly increased echogenicity of renal parenchyma is noted. No mass or hydronephrosis visualized. Left Kidney: Renal measurements: 9.5 x 4.2 x 3.9 cm =  volume: 81 mL. Mildly increased echogenicity of renal parenchyma is noted. No mass or hydronephrosis visualized. Bladder: Decompressed secondary to Foley catheter.  Mild ascites is noted. IMPRESSION: Mildly increased echogenicity of renal parenchyma is noted bilaterally suggesting medical renal disease. No hydronephrosis or renal obstruction is noted. Ascites is noted. Electronically Signed   By: Marijo Conception, M.D.   On: 11/10/2018 13:33   US  Paracentesis  Result Date: 11/10/2018 INDICATION: 62 year old with ascites. EXAM: ULTRASOUND-GUIDED PARACENTESIS COMPARISON:  None. MEDICATIONS: Nonene. COMPLICATIONS: None immediate. TECHNIQUE: Verbal consent was obtained from the patient's husband after a discussion of the risks, benefits and alternatives to treatment. A timeout was performed prior to the initiation of the procedure. Initial ultrasound scanning demonstrates a large amount of ascites within the right lower abdominal quadrant. The right lower abdomen was prepped and draped in the usual sterile fashion. 1% lidocaine was used for local anesthesia. Under direct ultrasound guidance, a Safe-T-Centesis catheter was introduced. An ultrasound image was saved for documentation purposed. The paracentesis was performed. The catheter was removed and a dressing was applied. The patient tolerated the procedure well without immediate post procedural complication. FINDINGS: A total of approximately 5 L liters of yellow fluid was removed. Samples were sent to the laboratory as requested by the clinical team. IMPRESSION: Successful ultrasound-guided paracentesis yielding 5 liters of peritoneal fluid. Electronically Signed   By: Markus Daft M.D.   On: 11/10/2018 13:11   Dg Abdomen Acute W/chest  Result Date: 11/09/2018 CLINICAL DATA:  Altered mental status.  Distended abdomen. EXAM: DG ABDOMEN ACUTE W/ 1V CHEST COMPARISON:  10/25/2018 FINDINGS: There are multiple distended fluid and air-filled loops of small intestine suggesting small-bowel obstruction or ileus. Loops are centrally located suggesting the presence of ascites. No sign of free air. No significant bone finding. One-view chest shows normal heart and mediastinal shadows with clear lungs. No effusions. No free air seen under the diaphragm. IMPRESSION: Abnormal small bowel gas pattern that could be due to small-bowel obstruction or ileus. Central location suggests the presence of ascites.  Electronically Signed   By: Nelson Chimes M.D.   On: 11/09/2018 14:56     Medications:   . albumin human 25 g (11/11/18 0948)  . cefTRIAXone (ROCEPHIN)  IV 1 g (11/10/18 1623)  .  sodium bicarbonate  infusion 1000 mL 50 mL/hr at 11/11/18 1330   . enoxaparin (LOVENOX) injection  30 mg Subcutaneous Q24H  . feeding supplement (ENSURE ENLIVE)  237 mL Oral BID BM  . folic acid  1 mg Oral Daily  . LORazepam  0-4 mg Intravenous Q6H   Followed by  . LORazepam  0-4 mg Intravenous Q12H  . midodrine  5 mg Oral TID WC  . multivitamin with minerals  1 tablet Oral Daily  . nicotine  21 mg Transdermal Daily  . octreotide  100 mcg Subcutaneous TID  . sodium chloride  1 g Oral BID WC  . thiamine  100 mg Oral Daily   Or  . thiamine  100 mg Intravenous Daily  . venlafaxine  37.5 mg Oral BID WC   acetaminophen **OR** acetaminophen, guaiFENesin-dextromethorphan, LORazepam **OR** LORazepam, ondansetron **OR** ondansetron (ZOFRAN) IV, senna-docusate  Assessment/ Plan:  Ms. Dahiana Kulak is a 62 y.o. white female Ms. Caterra Ostroff is a 62 y.o. white female with hepatic cirrhosis, atrial fibrillation, EtOH, depression, who was admitted to [facility] on 11/09/2018 for Hyponatremia and acute renal failure  1. Hyponatremia: secondary to hepatic cirrhosis. Improving with PO and IV replacement  2. Acute Renal Failure: history is consistent with prerenal failure. However there is concern for hepatorenal syndrome Creatinine remains stable from admission ~2.5. Baseline creatinine of 0.88 on 10/29/18  Anuric - Check urine sodium - Continue IV fluids - Continue midodrine - Continue octreotide.   3. Metabolic acidosis - change IV fluids to sodium bicarbonate.   3. Edema and ascites: status post large volume paracentesis on 11/22  5 liters removed.   4. Acute hepatic encephalopathy   LOS: 2 Levern Pitter 11/23/20191:31 PM

## 2018-11-11 NOTE — Progress Notes (Signed)
OT Cancellation Note  Patient Details Name: [name] MRN: [id number] DOB: Nov 08, 1956   Cancelled Treatment:    Reason Eval/Treat Not Completed: Patient's level of consciousness  Evaluation deferred based on conversation with PT. Per PT patient was unable to follow any commands and was agitated. Will re-attempt at a later time.  Amie Portland, OTR/L Meliana Canner L 11/11/2018, 3:29 PM

## 2018-11-12 LAB — COMPREHENSIVE METABOLIC PANEL
ALK PHOS: 53 U/L (ref 38–126)
ALT: 11 U/L (ref 0–44)
AST: 27 U/L (ref 15–41)
Albumin: 3 g/dL — ABNORMAL LOW (ref 3.5–5.0)
Anion gap: 10 (ref 5–15)
BILIRUBIN TOTAL: 1.3 mg/dL — AB (ref 0.3–1.2)
BUN: 35 mg/dL — ABNORMAL HIGH (ref 8–23)
CALCIUM: 8.4 mg/dL — AB (ref 8.9–10.3)
CO2: 20 mmol/L — ABNORMAL LOW (ref 22–32)
CREATININE: 2.58 mg/dL — AB (ref 0.44–1.00)
Chloride: 104 mmol/L (ref 98–111)
GFR calc Af Amer: 22 mL/min — ABNORMAL LOW (ref >60)
GFR, EST NON AFRICAN AMERICAN: 19 mL/min — AB (ref >60)
Glucose, Bld: 121 mg/dL — ABNORMAL HIGH (ref 70–99)
Potassium: 3.1 mmol/L — ABNORMAL LOW (ref 3.5–5.1)
Sodium: 134 mmol/L — ABNORMAL LOW (ref 135–145)
Total Protein: 4.7 g/dL — ABNORMAL LOW (ref 6.5–8.1)

## 2018-11-12 LAB — OSMOLALITY: Osmolality: 275 mOsm/kg (ref 275–295)

## 2018-11-12 LAB — URINE CULTURE: Culture: 10000 — AB

## 2018-11-12 LAB — OSMOLALITY, URINE: Osmolality, Ur: 363 mOsm/kg (ref 300–900)

## 2018-11-12 MED ORDER — POTASSIUM CHLORIDE CRYS ER 20 MEQ PO TBCR
40.0000 meq | EXTENDED_RELEASE_TABLET | Freq: Once | ORAL | Status: AC
  Administered 2018-11-12: 40 meq via ORAL
  Filled 2018-11-12: qty 2

## 2018-11-12 MED ORDER — LACTULOSE 10 GM/15ML PO SOLN
30.0000 g | Freq: Three times a day (TID) | ORAL | Status: DC
  Administered 2018-11-12 – 2018-11-19 (×14): 30 g via ORAL
  Filled 2018-11-12 (×22): qty 60

## 2018-11-12 MED ORDER — ALBUMIN HUMAN 25 % IV SOLN
25.0000 g | Freq: Two times a day (BID) | INTRAVENOUS | Status: DC
  Administered 2018-11-12 – 2018-11-13 (×3): 25 g via INTRAVENOUS
  Filled 2018-11-12 (×4): qty 100

## 2018-11-12 NOTE — Progress Notes (Signed)
[place] [facility] [place] NAME: [name]    MR#:  [id number]  DATE OF BIRTH:  Oct 25, 1956  SUBJECTIVE:  CHIEF COMPLAINT:   Chief Complaint  Patient presents with  . Altered Mental Status  Patient seen today Confused Not completely oriented to time place and person Had a large volume paracentesis recently  REVIEW OF SYSTEMS:    ROS  Could not be obtained secondary to confusion  DRUG ALLERGIES:  No Known Allergies  VITALS:  Blood pressure 90/61, pulse 88, temperature 98.6 F (37 C), temperature source Oral, resp. rate 15, height 5\' 9"  (1.753 m), weight 65.3 kg, SpO2 98 %.  PHYSICAL EXAMINATION:   Physical Exam  GENERAL:  62 y.o.-year-old patient lying in the bed  EYES: Pupils equal, round, reactive to light and accommodation. No scleral icterus. Extraocular muscles intact.  HEENT: Head atraumatic, normocephalic. Oropharynx and nasopharynx clear.  NECK:  Supple, no jugular venous distention. No thyroid enlargement, no tenderness.  LUNGS: Normal breath sounds bilaterally, no wheezing, rales, rhonchi. No use of accessory muscles of respiration.  CARDIOVASCULAR: S1, S2 normal. No murmurs, rubs, or gallops.  ABDOMEN: Soft, nontender, has distension. Bowel sounds present. No organomegaly or mass.  EXTREMITIES: No cyanosis, clubbing  1 plus edema b/l.    NEUROLOGIC: Cranial nerves II through XII are intact. No focal Motor or sensory deficits b/l.   PSYCHIATRIC: Not oriented to time place and person SKIN: No obvious rash, lesion, or ulcer.   LABORATORY PANEL:   CBC Recent Labs  Lab 11/10/18 0324  WBC 12.5*  HGB 11.9*  HCT 36.0  PLT 324   ------------------------------------------------------------------------------------------------------------------ Chemistries  Recent Labs  Lab 11/12/18 0435  NA 134*  K 3.1*  CL 104  CO2 20*  GLUCOSE 121*  BUN 35*  CREATININE 2.58*  CALCIUM 8.4*  AST 27  ALT 11  ALKPHOS 53   BILITOT 1.3*   ------------------------------------------------------------------------------------------------------------------  Cardiac Enzymes Recent Labs  Lab 11/09/18 1318  TROPONINI <0.03   ------------------------------------------------------------------------------------------------------------------  RADIOLOGY:  Ct Head Wo Contrast  Result Date: 11/10/2018 CLINICAL DATA:  Altered mental status x2 days. EXAM: CT HEAD WITHOUT CONTRAST TECHNIQUE: Contiguous axial images were obtained from the base of the skull through the vertex without intravenous contrast. COMPARISON:  None. FINDINGS: BRAIN: There is mild sulcal prominence consistent with superficial atrophy. No hydrocephalus. No intraparenchymal hemorrhage, mass effect nor midline shift. 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 No acute large vascular territory infarcts. No abnormal extra-axial fluid collections. Basal cisterns are not effaced and midline. Mild cerebellar atrophy. Brainstem and cerebellum are nonacute. VASCULAR: No hyperdense vessel sign or unexpected calcifications. SKULL: No skull fracture. No significant scalp soft tissue swelling. SINUSES/ORBITS: The mastoid air-cells are clear. The included paranasal sinuses are well-aerated.The included ocular globes and orbital contents are non-suspicious. OTHER: None. IMPRESSION: 1. 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 2. No acute intracranial abnormality. 3. Age related involutional changes of the brain. Electronically Signed   By: Ashley Royalty M.D.   On: 11/10/2018 15:41   US Renal  Result Date: 11/10/2018 CLINICAL DATA:  Acute renal failure. EXAM: RENAL / URINARY TRACT ULTRASOUND COMPLETE COMPARISON:  CT scan of November 09, 2018. FINDINGS: Right Kidney: Renal measurements: 10.0 x 5.3 x 5.0 cm = volume: 138 mL. Probable 1.9 cm exophytic cyst is seen  arising from upper pole. Mildly increased echogenicity of renal parenchyma is noted. No mass or hydronephrosis visualized. Left  Kidney: Renal measurements: 9.5 x 4.2 x 3.9 cm = volume: 81 mL. Mildly increased echogenicity of renal parenchyma is noted. No mass or hydronephrosis visualized. Bladder: Decompressed secondary to Foley catheter.  Mild ascites is noted. IMPRESSION: Mildly increased echogenicity of renal parenchyma is noted bilaterally suggesting medical renal disease. No hydronephrosis or renal obstruction is noted. Ascites is noted. Electronically Signed   By: Marijo Conception, M.D.   On: 11/10/2018 13:33   US Paracentesis  Result Date: 11/10/2018 INDICATION: 62 year old with ascites. EXAM: ULTRASOUND-GUIDED PARACENTESIS COMPARISON:  None. MEDICATIONS: Nonene. COMPLICATIONS: None immediate. TECHNIQUE: Verbal consent was obtained from the patient's husband after a discussion of the risks, benefits and alternatives to treatment. A timeout was performed prior to the initiation of the procedure. Initial ultrasound scanning demonstrates a large amount of ascites within the right lower abdominal quadrant. The right lower abdomen was prepped and draped in the usual sterile fashion. 1% lidocaine was used for local anesthesia. Under direct ultrasound guidance, a Safe-T-Centesis catheter was introduced. An ultrasound image was saved for documentation purposed. The paracentesis was performed. The catheter was removed and a dressing was applied. The patient tolerated the procedure well without immediate post procedural complication. FINDINGS: A total of approximately 5 L liters of yellow fluid was removed. Samples were sent to the laboratory as requested by the clinical team. IMPRESSION: Successful ultrasound-guided paracentesis yielding 5 liters of peritoneal fluid. Electronically Signed   By: Markus Daft M.D.   On: 11/10/2018 13:11     ASSESSMENT AND PLAN:  62 year old female patient with history of tobacco  abuse, alcohol abuse, liver cirrhosis, atrial fibrillation currently under hospitalist service for altered mental status and abdominal distention  -Decompensated liver cirrhosis with ascites status post large volume paracentesis Status post GI evaluation and follow-up Continue albumin, octreotide  -Acute encephalopathy  Multifactorial in etiology Probability of hepatic encephalopathy Ammonia is normal On IV Rocephin antibiotic  -Hyponatremia better Discontinue IV fluids  Appreciate nephrology follow-up Sodium tablets deferred to avoid worsening of ascites  -Metabolic acidosis On IV sodium bicarbonate fluids  -Acute renal failure Probably ATN versus hepatorenal syndrome Continue oral midodrine Continue octreotide Continue albumin Is oliguric with ascites On Foley catheter Probably from hepatorenal syndrome Follow-up renal ultrasound  -Acute hypokalemia Replace potassium aggressively  -Chronic atrial fibrillation Rate controlled with amiodarone and Cardizem Not a candidate for anticoagulation  -Tobacco abuse On nicotine patch  -DVT prophylaxis with sequential compression device to lower extremities  -Overall prognosis poor Discussed with patient's husband   All the records are reviewed and case discussed with Care Management/Social Worker. Management plans discussed with the patient, family and they are in agreement.  CODE STATUS: Full code  DVT Prophylaxis: SCDs  TOTAL TIME TAKING CARE OF THIS PATIENT: 34 minutes.   POSSIBLE D/C IN 2 to 3 DAYS, DEPENDING ON CLINICAL CONDITION.  [name] M.D on 11/12/2018 at 10:55 AM  Between 7am to 6pm - Pager - [phone]  After 6pm go to [email or web address] - password [email or web address] [facility]  Office  [phone]  CC: Primary care physician; [place]  Note: This dictation was prepared with Dragon dictation along with smaller Company secretary. Any transcriptional errors that  result from this process are unintentional.

## 2018-11-12 NOTE — Progress Notes (Signed)
[facility]  ROUNDING NOTE   Subjective:   Intermittently confused this morning.   Na 134  Objective:  Vital signs in last 24 hours:  Temp:  [97.8 F (36.6 C)-98.6 F (37 C)] 97.8 F (36.6 C) (11/24 1151) Pulse Rate:  [86-88] 88 (11/24 1151) Resp:  [15-18] 15 (11/24 1151) BP: (90-91)/(58-61) 91/59 (11/24 1151) SpO2:  [96 %-98 %] 96 % (11/24 1151)  Weight change:  Filed Weights   11/09/18 1321  Weight: 65.3 kg    Intake/Output: I/O last 3 completed shifts: In: 1334.5 [P.O.:120; I.V.:829.5; IV Piggyback:384.9] Out: 350 [Urine:350]   Intake/Output this shift:  No intake/output data recorded.  Physical Exam: General: NAD,   Head: Normocephalic, atraumatic. Moist oral mucosal membranes  Eyes: Anicteric, PERRL  Neck: Supple, trachea midline  Lungs:  Clear to auscultation  Heart: Regular rate and rhythm  Abdomen:  +ascites  Extremities:  trace peripheral edema.  Neurologic: Nonfocal, moving all four extremities, alert to self only  Skin: No lesions        Basic Metabolic Panel: Recent Labs  Lab 11/09/18 1318 11/10/18 0324 11/11/18 0531 11/12/18 0435  NA 125* 126* 131* 134*  K 4.1 3.9 3.4* 3.1*  CL 93* 98 101 104  CO2 22 18* 17* 20*  GLUCOSE 96 78 64* 121*  BUN 36* 36* 37* 35*  CREATININE 2.52* 2.54* 2.60* 2.58*  CALCIUM 8.6* 7.8* 8.1* 8.4*    Liver Function Tests: Recent Labs  Lab 11/09/18 1318 11/12/18 0435  AST 48* 27  ALT 24 11  ALKPHOS 115 53  BILITOT 1.2 1.3*  PROT 5.8* 4.7*  ALBUMIN 2.0* 3.0*   No results for input(s): LIPASE, AMYLASE in the last 168 hours. Recent Labs  Lab 11/09/18 1318  AMMONIA 21    CBC: Recent Labs  Lab 11/09/18 1318 11/10/18 0324  WBC 9.6 12.5*  NEUTROABS 7.2  --   HGB 13.2 11.9*  HCT 38.8 36.0  MCV 97.5 97.8  PLT 330 324    Cardiac Enzymes: Recent Labs  Lab 11/09/18 1318  TROPONINI <0.03    BNP: Invalid input(s): POCBNP  CBG: No results for input(s): GLUCAP in the last 168  hours.  Microbiology: Results for orders placed or performed during the hospital encounter of 11/09/18  Body fluid culture     Status: None (Preliminary result)   Collection Time: 11/10/18 12:19 PM  Result Value Ref Range Status   Specimen Description   Final    PERITONEAL Performed at [facility], [address] [place], [place] [address]    Special Requests   Final    NONE Performed at [facility], [address] [place], [place] [address]    Gram Stain   Final    WBC PRESENT,BOTH PMN AND MONONUCLEAR NO ORGANISMS SEEN CYTOSPIN SMEAR    Culture   Final    NO GROWTH 2 DAYS Performed at [facility], [place] [address], [place], [place] [address]    Report Status PENDING  Incomplete  Urine Culture     Status: Abnormal   Collection Time: 11/10/18  4:28 PM  Result Value Ref Range Status   Specimen Description   Final    URINE, RANDOM Performed at [facility], [address], [place], [place] [address]    Special Requests   Final    NONE Performed at [facility], [place], [place] [address]    Culture 10,000 COLONIES/mL YEAST (A)  Final  Report Status 11/12/2018 FINAL  Final    Coagulation Studies: Recent Labs    11/09/18 1318  LABPROT 12.8  INR 0.97    Urinalysis: Recent Labs    11/09/18 1520 11/10/18 1628  COLORURINE BROWN* AMBER*  LABSPEC 1.020 1.014  PHURINE TEST NOT REPORTED DUE TO COLOR INTERFERENCE OF URINE PIGMENT 5.0  GLUCOSEU TEST NOT REPORTED DUE TO COLOR INTERFERENCE OF URINE PIGMENT* NEGATIVE  HGBUR TEST NOT REPORTED DUE TO COLOR INTERFERENCE OF URINE PIGMENT* LARGE*  BILIRUBINUR TEST NOT REPORTED DUE TO COLOR INTERFERENCE OF URINE PIGMENT* NEGATIVE  KETONESUR TEST NOT REPORTED DUE TO COLOR INTERFERENCE OF URINE PIGMENT* NEGATIVE  PROTEINUR TEST NOT REPORTED DUE TO COLOR INTERFERENCE OF URINE PIGMENT* 30*  NITRITE TEST NOT REPORTED DUE TO COLOR INTERFERENCE OF URINE PIGMENT* NEGATIVE   LEUKOCYTESUR TEST NOT REPORTED DUE TO COLOR INTERFERENCE OF URINE PIGMENT* MODERATE*      Imaging: Ct Head Wo Contrast  Result Date: 11/10/2018 CLINICAL DATA:  Altered mental status x2 days. EXAM: CT HEAD WITHOUT CONTRAST TECHNIQUE: Contiguous axial images were obtained from the base of the skull through the vertex without intravenous contrast. COMPARISON:  None. FINDINGS: BRAIN: There is mild sulcal prominence consistent with superficial atrophy. No hydrocephalus. No intraparenchymal hemorrhage, mass effect nor midline shift. 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 No acute large vascular territory infarcts. No abnormal extra-axial fluid collections. Basal cisterns are not effaced and midline. Mild cerebellar atrophy. Brainstem and cerebellum are nonacute. VASCULAR: No hyperdense vessel sign or unexpected calcifications. SKULL: No skull fracture. No significant scalp soft tissue swelling. SINUSES/ORBITS: The mastoid air-cells are clear. The included paranasal sinuses are well-aerated.The included ocular globes and orbital contents are non-suspicious. OTHER: None. IMPRESSION: 1. 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 2. No acute intracranial abnormality. 3. Age related involutional changes of the brain. Electronically Signed   By: Ashley Royalty M.D.   On: 11/10/2018 15:41   US Renal  Result Date: 11/10/2018 CLINICAL DATA:  Acute renal failure. EXAM: RENAL / URINARY TRACT ULTRASOUND COMPLETE COMPARISON:  CT scan of November 09, 2018. FINDINGS: Right Kidney: Renal measurements: 10.0 x 5.3 x 5.0 cm = volume: 138 mL. Probable 1.9 cm exophytic cyst is seen arising from upper pole. Mildly increased echogenicity of renal parenchyma is noted. No mass or hydronephrosis visualized. Left Kidney: Renal measurements: 9.5 x 4.2 x 3.9 cm = volume: 81 mL. Mildly increased echogenicity of renal  parenchyma is noted. No mass or hydronephrosis visualized. Bladder: Decompressed secondary to Foley catheter.  Mild ascites is noted. IMPRESSION: Mildly increased echogenicity of renal parenchyma is noted bilaterally suggesting medical renal disease. No hydronephrosis or renal obstruction is noted. Ascites is noted. Electronically Signed   By: Marijo Conception, M.D.   On: 11/10/2018 13:33   US Paracentesis  Result Date: 11/10/2018 INDICATION: 62 year old with ascites. EXAM: ULTRASOUND-GUIDED PARACENTESIS COMPARISON:  None. MEDICATIONS: Nonene. COMPLICATIONS: None immediate. TECHNIQUE: Verbal consent was obtained from the patient's husband after a discussion of the risks, benefits and alternatives to treatment. A timeout was performed prior to the initiation of the procedure. Initial ultrasound scanning demonstrates a large amount of ascites within the right lower abdominal quadrant. The right lower abdomen was prepped and draped in the usual sterile fashion. 1% lidocaine was used for local anesthesia. Under direct ultrasound guidance, a Safe-T-Centesis catheter was introduced. An ultrasound image was saved for documentation purposed. The paracentesis was performed. The catheter was removed and a dressing was  applied. The patient tolerated the procedure well without immediate post procedural complication. FINDINGS: A total of approximately 5 L liters of yellow fluid was removed. Samples were sent to the laboratory as requested by the clinical team. IMPRESSION: Successful ultrasound-guided paracentesis yielding 5 liters of peritoneal fluid. Electronically Signed   By: Markus Daft M.D.   On: 11/10/2018 13:11     Medications:   . albumin human    . cefTRIAXone (ROCEPHIN)  IV Stopped (11/11/18 1800)  .  sodium bicarbonate  infusion 1000 mL 50 mL/hr at 11/12/18 0554   . enoxaparin (LOVENOX) injection  30 mg Subcutaneous Q24H  . feeding supplement (ENSURE ENLIVE)  237 mL Oral BID BM  . folic acid  1 mg Oral  Daily  . lactulose  30 g Oral TID  . LORazepam  0-4 mg Intravenous Q12H  . midodrine  5 mg Oral TID WC  . multivitamin with minerals  1 tablet Oral Daily  . nicotine  21 mg Transdermal Daily  . octreotide  100 mcg Subcutaneous TID  . thiamine  100 mg Oral Daily   Or  . thiamine  100 mg Intravenous Daily  . venlafaxine  37.5 mg Oral BID WC   acetaminophen **OR** acetaminophen, guaiFENesin-dextromethorphan, LORazepam **OR** LORazepam, ondansetron **OR** ondansetron (ZOFRAN) IV, senna-docusate  Assessment/ Plan:  [name] is a 62 y.o. white female [name] is a 62 y.o. white female with hepatic cirrhosis, atrial fibrillation, EtOH, depression, who was admitted to [facility] on 11/09/2018 for Hyponatremia and acute renal failure  1. Hyponatremia: secondary to hepatic cirrhosis. Improving with PO and IV replacement.  - Discontinue IV fluids as ascites is reaccumulating.  - PO sodium chloride.   2. Acute Renal Failure: concern for hepatorenal syndrome. No improvement in creatinine despite albumin volume expansion.  However there is concern for hepatorenal syndrome Creatinine remains stable from admission ~2.5. Baseline creatinine of 0.88 on 10/29/18  Anuric - Completed three days of albumin - Continue midodrine - Continue octreotide.   3. Hypokalemia - improved with IV replacement.  4. Edema and ascites: status post large volume paracentesis on 11/22  5 liters removed.  Holding diuretics.   4. Acute hepatic encephalopathy - lactulose   LOS: 3 Fred Franzen 11/24/201912:17 PM

## 2018-11-13 LAB — CBC
HCT: 28.2 % — ABNORMAL LOW (ref 36.0–46.0)
Hemoglobin: 9.5 g/dL — ABNORMAL LOW (ref 12.0–15.0)
MCH: 33.1 pg (ref 26.0–34.0)
MCHC: 33.7 g/dL (ref 30.0–36.0)
MCV: 98.3 fL (ref 80.0–100.0)
NRBC: 0 % (ref 0.0–0.2)
PLATELETS: 233 10*3/uL (ref 150–400)
RBC: 2.87 MIL/uL — AB (ref 3.87–5.11)
RDW: 13.2 % (ref 11.5–15.5)
WBC: 4.1 10*3/uL (ref 4.0–10.5)

## 2018-11-13 LAB — COMPREHENSIVE METABOLIC PANEL
ALT: 11 U/L (ref 0–44)
AST: 26 U/L (ref 15–41)
Albumin: 3.3 g/dL — ABNORMAL LOW (ref 3.5–5.0)
Alkaline Phosphatase: 44 U/L (ref 38–126)
Anion gap: 9 (ref 5–15)
BUN: 34 mg/dL — ABNORMAL HIGH (ref 8–23)
CHLORIDE: 104 mmol/L (ref 98–111)
CO2: 25 mmol/L (ref 22–32)
CREATININE: 2.38 mg/dL — AB (ref 0.44–1.00)
Calcium: 8.7 mg/dL — ABNORMAL LOW (ref 8.9–10.3)
GFR calc non Af Amer: 21 mL/min — ABNORMAL LOW (ref >60)
GFR, EST AFRICAN AMERICAN: 24 mL/min — AB (ref >60)
Glucose, Bld: 133 mg/dL — ABNORMAL HIGH (ref 70–99)
Potassium: 2.7 mmol/L — CL (ref 3.5–5.1)
SODIUM: 138 mmol/L (ref 135–145)
Total Bilirubin: 1.1 mg/dL (ref 0.3–1.2)
Total Protein: 4.9 g/dL — ABNORMAL LOW (ref 6.5–8.1)

## 2018-11-13 LAB — PH, BODY FLUID: PH, BODY FLUID: 7.8

## 2018-11-13 LAB — BODY FLUID CULTURE: Culture: NO GROWTH

## 2018-11-13 MED ORDER — SPIRONOLACTONE 25 MG PO TABS
25.0000 mg | ORAL_TABLET | Freq: Every day | ORAL | Status: DC
  Administered 2018-11-15 – 2018-11-20 (×6): 25 mg via ORAL
  Filled 2018-11-13 (×7): qty 1

## 2018-11-13 MED ORDER — POTASSIUM CHLORIDE 20 MEQ PO PACK
40.0000 meq | PACK | Freq: Two times a day (BID) | ORAL | Status: DC
  Administered 2018-11-13: 40 meq via ORAL
  Filled 2018-11-13: qty 2

## 2018-11-13 MED ORDER — POTASSIUM CHLORIDE 20 MEQ PO PACK
40.0000 meq | PACK | ORAL | Status: AC
  Administered 2018-11-13: 40 meq via ORAL
  Filled 2018-11-13: qty 2

## 2018-11-13 MED ORDER — POTASSIUM CHLORIDE 20 MEQ PO PACK: 40.0000 meq | PACK | Freq: Two times a day (BID) | ORAL | Status: DC

## 2018-11-13 NOTE — Progress Notes (Signed)
[facility]  ROUNDING NOTE   Subjective:   Patient is very lethargic Able to follow few basic commands but not answering questions states she had a rough day  Objective:  Vital signs in last 24 hours:  Temp:  [97.4 F (36.3 C)-98.3 F (36.8 C)] 97.4 F (36.3 C) (11/25 1154) Pulse Rate:  [83-87] 87 (11/25 1154) Resp:  [20] 20 (11/25 1154) BP: (87-95)/(56-71) 95/62 (11/25 1154) SpO2:  [96 %-98 %] 98 % (11/25 1154)  Weight change:  Filed Weights   11/09/18 1321  Weight: 65.3 kg    Intake/Output: I/O last 3 completed shifts: In: 2338.2 [P.O.:340; I.V.:1621.9; IV Piggyback:376.3] Out: 430 [Urine:430]   Intake/Output this shift:  Total I/O In: 192.7 [P.O.:80; I.V.:96.8; IV Piggyback:15.9] Out: 100 [Urine:100]  Physical Exam: General: NAD,   Head: Normocephalic, atraumatic. Moist oral mucosal membranes  Eyes: Anicteric,   Neck: Supple, trachea midline  Lungs:  Clear to auscultation  Heart: Regular rate and rhythm  Abdomen:  +Tense ascites  Extremities:  trace peripheral edema.  Neurologic: Nonfocal, moving all four extremities, alert to self only  Skin: No lesions        Basic Metabolic Panel: Recent Labs  Lab 11/09/18 1318 11/10/18 0324 11/11/18 0531 11/12/18 0435 11/13/18 0321  NA 125* 126* 131* 134* 138  K 4.1 3.9 3.4* 3.1* 2.7*  CL 93* 98 101 104 104  CO2 22 18* 17* 20* 25  GLUCOSE 96 78 64* 121* 133*  BUN 36* 36* 37* 35* 34*  CREATININE 2.52* 2.54* 2.60* 2.58* 2.38*  CALCIUM 8.6* 7.8* 8.1* 8.4* 8.7*    Liver Function Tests: Recent Labs  Lab 11/09/18 1318 11/12/18 0435 11/13/18 0321  AST 48* 27 26  ALT 24 11 11   ALKPHOS 115 53 44  BILITOT 1.2 1.3* 1.1  PROT 5.8* 4.7* 4.9*  ALBUMIN 2.0* 3.0* 3.3*   No results for input(s): LIPASE, AMYLASE in the last 168 hours. Recent Labs  Lab 11/09/18 1318  AMMONIA 21    CBC: Recent Labs  Lab 11/09/18 1318 11/10/18 0324 11/13/18 0321  WBC 9.6 12.5* 4.1  NEUTROABS 7.2  --   --    HGB 13.2 11.9* 9.5*  HCT 38.8 36.0 28.2*  MCV 97.5 97.8 98.3  PLT 330 324 233    Cardiac Enzymes: Recent Labs  Lab 11/09/18 1318  TROPONINI <0.03    BNP: Invalid input(s): POCBNP  CBG: No results for input(s): GLUCAP in the last 168 hours.  Microbiology: Results for orders placed or performed during the hospital encounter of 11/09/18  Body fluid culture     Status: None (Preliminary result)   Collection Time: 11/10/18 12:19 PM  Result Value Ref Range Status   Specimen Description   Final    PERITONEAL Performed at [facility], [address], [place], [place] [address]    Special Requests   Final    NONE Performed at [facility], [address] [place], [place] [address]    Gram Stain   Final    WBC PRESENT,BOTH PMN AND MONONUCLEAR NO ORGANISMS SEEN CYTOSPIN SMEAR    Culture   Final    NO GROWTH 3 DAYS Performed at [facility], [place] [address] [place], [place] [address]    Report Status PENDING  Incomplete  Urine Culture     Status: Abnormal   Collection Time: 11/10/18  4:28 PM  Result Value Ref Range Status   Specimen Description   Final    URINE, RANDOM  Performed at [facility], [address], [place], [place] [address]    Special Requests   Final    NONE Performed at [facility], [place], [place] [address]    Culture 10,000 COLONIES/mL YEAST (A)  Final   Report Status 11/12/2018 FINAL  Final    Coagulation Studies: No results for input(s): LABPROT, INR in the last 72 hours.  Urinalysis: Recent Labs    11/10/18 1628  COLORURINE AMBER*  LABSPEC 1.014  PHURINE 5.0  GLUCOSEU NEGATIVE  HGBUR LARGE*  BILIRUBINUR NEGATIVE  KETONESUR NEGATIVE  PROTEINUR 30*  NITRITE NEGATIVE  LEUKOCYTESUR MODERATE*      Imaging: No results found.   Medications:   . albumin human 60 mL/hr at 11/13/18 1048  . cefTRIAXone (ROCEPHIN)  IV Stopped (11/12/18 1546)  .  sodium bicarbonate   infusion 1000 mL Stopped (11/13/18 0902)   . feeding supplement (ENSURE ENLIVE)  237 mL Oral BID BM  . folic acid  1 mg Oral Daily  . lactulose  30 g Oral TID  . LORazepam  0-4 mg Intravenous Q12H  . midodrine  5 mg Oral TID WC  . multivitamin with minerals  1 tablet Oral Daily  . nicotine  21 mg Transdermal Daily  . octreotide  100 mcg Subcutaneous TID  . potassium chloride  40 mEq Oral Q4H  . thiamine  100 mg Oral Daily   Or  . thiamine  100 mg Intravenous Daily  . venlafaxine  37.5 mg Oral BID WC   acetaminophen **OR** acetaminophen, guaiFENesin-dextromethorphan, ondansetron **OR** ondansetron (ZOFRAN) IV, senna-docusate  Assessment/ Plan:  [name] is a 62 y.o. white female [name] is a 62 y.o. white female with hepatic cirrhosis, atrial fibrillation, EtOH, depression, who was admitted to [facility] on 11/09/2018 for Hyponatremia and acute renal failure  1. Hyponatremia: secondary to hepatic cirrhosis.  - Avoid IV fluids as ascites is reaccumulating.   2. Acute Renal Failure: concern for hepatorenal syndrome. No improvement in creatinine despite albumin volume expansion.  However there is concern for hepatorenal syndrome Creatinine slightly improved today Baseline creatinine of 0.88 on 10/29/18  - Contonue albumin - Continue midodrine - Continue octreotide.   3. Hypokalemia -  Add spironolactone  4. Edema and ascites: status post large volume paracentesis on 11/22  5 liters removed.   4. Acute hepatic encephalopathy - lactulose   LOS: 4 [name] 11/25/20193:34 PM

## 2018-11-13 NOTE — Progress Notes (Signed)
OT Cancellation Note  Patient Details Name: [name] MRN: [id number] DOB: 23-Apr-1956   Cancelled Treatment:    Reason Eval/Treat Not Completed: Medical issues which prohibited therapy. Order received, chart reviewed. Pt with low potassium, this am 2.7. Will hold OT evaluation at this time and re-attempt at later date/time as . K+ improves and pt is medically appropriate.  Jeni Salles, MPH, MS, OTR/L ascom [phone] 11/13/18, 8:36 AM

## 2018-11-13 NOTE — Progress Notes (Signed)
Patient given morning meds. Shortly after taking them, patient vomited.

## 2018-11-13 NOTE — Progress Notes (Addendum)
Vonda Antigua, MD [address] [place], [place], [place], [address] [address], [place], [place], [place], [address] Phone: [phone]  Fax: [phone]   Subjective: Pt awake and answering questions. Alert to person, but not to place and time.    Objective: Exam: Vital signs in last 24 hours: Vitals:   11/12/18 1151 11/12/18 2031 11/13/18 0516 11/13/18 0804  BP: (!) 91/59 (!) 87/71 92/61 (!) 94/56  Pulse: 88 85 83 84  Resp: 15 20 20 20   Temp: 97.8 F (36.6 C) 98.3 F (36.8 C) 97.6 F (36.4 C) 97.8 F (36.6 C)  TempSrc:  Oral Oral Oral  SpO2: 96% 98% 96% 97%  Weight:      Height:       Weight change:   Intake/Output Summary (Last 24 hours) at 11/13/2018 1008 Last data filed at 11/13/2018 0900 Gross per 24 hour  Intake 1362.51 ml  Output 180 ml  Net 1182.51 ml    General: No acute distress, alert and oriented to person only Abd: Soft, distended but not tender, No HSM Skin: Warm, no rashes Neck: Supple, Trachea midline   Lab Results: Lab Results  Component Value Date   WBC 4.1 11/13/2018   HGB 9.5 (L) 11/13/2018   HCT 28.2 (L) 11/13/2018   MCV 98.3 11/13/2018   PLT 233 11/13/2018   Micro Results: Recent Results (from the past 240 hour(s))  Body fluid culture     Status: None (Preliminary result)   Collection Time: 11/10/18 12:19 PM  Result Value Ref Range Status   Specimen Description   Final    PERITONEAL Performed at [name], [address] [place], [place] [address]    Special Requests   Final    NONE Performed at [facility], [address] [place], [place] [address]    Gram Stain   Final    WBC PRESENT,BOTH PMN AND MONONUCLEAR NO ORGANISMS SEEN CYTOSPIN SMEAR    Culture   Final    NO GROWTH 2 DAYS Performed at [facility], [place] [address], [place], [place] [address]    Report Status PENDING  Incomplete  Urine Culture     Status: Abnormal   Collection Time: 11/10/18  4:28 PM  Result  Value Ref Range Status   Specimen Description   Final    URINE, RANDOM Performed at [facility], [address], [place], [place] [address]    Special Requests   Final    NONE Performed at [facility], [place], [place] [address]    Culture 10,000 COLONIES/mL YEAST (A)  Final   Report Status 11/12/2018 FINAL  Final   Studies/Results: No results found. Medications:  Scheduled Meds: . enoxaparin (LOVENOX) injection  30 mg Subcutaneous Q24H  . feeding supplement (ENSURE ENLIVE)  237 mL Oral BID BM  . folic acid  1 mg Oral Daily  . lactulose  30 g Oral TID  . LORazepam  0-4 mg Intravenous Q12H  . midodrine  5 mg Oral TID WC  . multivitamin with minerals  1 tablet Oral Daily  . nicotine  21 mg Transdermal Daily  . octreotide  100 mcg Subcutaneous TID  . potassium chloride  40 mEq Oral BID  . thiamine  100 mg Oral Daily   Or  . thiamine  100 mg Intravenous Daily  . venlafaxine  37.5 mg Oral BID WC   Continuous Infusions: . albumin human Stopped (11/13/18 0010)  . cefTRIAXone (  ROCEPHIN)  IV 1 g (11/12/18 1537)  .  sodium bicarbonate  infusion 1000 mL Stopped (11/13/18 0917)   PRN Meds:.acetaminophen **OR** acetaminophen, guaiFENesin-dextromethorphan, ondansetron **OR** ondansetron (ZOFRAN) IV, senna-docusate   Assessment: Active Problems:   Renal failure   Ascites    Plan: Hyponatremia has improved Continue to correct electrolyte abnormalities, patient found to be hypokalemic today  See Dr. Georgeann Oppenheim very detailed note from 11/11/2018 for etiology of her hyponatremia  Patient on albumin, midodrine, octreotide Nephrology following Sodium restriction to less than 2 g a day  Consider repeat paracentesis this week or next week depending on abdominal exam.  Continue lactulose but titrate it to where patient is not having any more than 2-3 bowel movements a day as diarrhea can worsen encephalopathy Start rifaximin twice daily Body fluid  studies from 11/10/2018 not consistent with SBP  CT head report shows chronic microvascular changes, no acute abnormality. CIWA protocol Folate, thiamine  Continue to encourage abstinence   LOS: 4 days   Vonda Antigua, MD 11/13/2018, 10:08 AM

## 2018-11-13 NOTE — Progress Notes (Signed)
[name] at [place] NAME: [name]    MR#:  [id number]  DATE OF BIRTH:  [birth year]  SUBJECTIVE:  CHIEF COMPLAINT:   Chief Complaint  Patient presents with  . Altered Mental Status  Patient seen today Alert and less confused No complaints of any abdominal pain No fever   REVIEW OF SYSTEMS:    ROS  Could not be obtained secondary to on and off confusion  DRUG ALLERGIES:  No Known Allergies  VITALS:  Blood pressure (!) 94/56, pulse 84, temperature 97.8 F (36.6 C), temperature source Oral, resp. rate 20, height 5\' 9"  (1.753 m), weight 65.3 kg, SpO2 97 %.  PHYSICAL EXAMINATION:   Physical Exam  GENERAL:  62 y.o.-year-old patient lying in the bed  EYES: Pupils equal, round, reactive to light and accommodation. No scleral icterus. Extraocular muscles intact.  HEENT: Head atraumatic, normocephalic. Oropharynx and nasopharynx clear.  NECK:  Supple, no jugular venous distention. No thyroid enlargement, no tenderness.  LUNGS: Normal breath sounds bilaterally, no wheezing, rales, rhonchi. No use of accessory muscles of respiration.  CARDIOVASCULAR: S1, S2 normal. No murmurs, rubs, or gallops.  ABDOMEN: Soft, nontender, has distension and ascites. Bowel sounds present. No organomegaly or mass.  EXTREMITIES: No cyanosis, clubbing  1 plus edema b/l.    NEUROLOGIC: Cranial nerves II through XII are intact. No focal Motor or sensory deficits b/l.   PSYCHIATRIC: Oriented to self and place SKIN: No obvious rash, lesion, or ulcer.   LABORATORY PANEL:   CBC Recent Labs  Lab 11/13/18 0321  WBC 4.1  HGB 9.5*  HCT 28.2*  PLT 233   ------------------------------------------------------------------------------------------------------------------ Chemistries  Recent Labs  Lab 11/13/18 0321  NA 138  K 2.7*  CL 104  CO2 25  GLUCOSE 133*  BUN 34*  CREATININE 2.38*  CALCIUM 8.7*  AST 26  ALT 11  ALKPHOS 44  BILITOT 1.1    ------------------------------------------------------------------------------------------------------------------  Cardiac Enzymes Recent Labs  Lab 11/09/18 1318  TROPONINI <0.03   ------------------------------------------------------------------------------------------------------------------  RADIOLOGY:  No results found.   ASSESSMENT AND PLAN:  62 year old female patient with history of tobacco abuse, alcohol abuse, liver cirrhosis, atrial fibrillation currently under hospitalist service for altered mental status and abdominal distention  -Decompensated liver cirrhosis with ascites status post large volume paracentesis Status post GI evaluation and follow-up Continue albumin, octreotide  -Acute encephalopathy improving slowly Multifactorial in etiology Secondary to hepatic encephalopathy Continue oral lactulose On IV Rocephin antibiotic  -Hyponatremia improved Discontinued IV fluids  Appreciate nephrology follow-up Sodium tablets deferred to avoid worsening of ascites  -Metabolic acidosis improved off IV sodium bicarbonate fluids  -Acute renal failure improving Probably ATN versus hepatorenal syndrome Continue oral midodrine Continue octreotide Continue albumin Is oliguric with ascites On Foley catheter Probably from hepatorenal syndrome Follow-up renal ultrasound  -Acute hypokalemia Replace potassium aggressively  -Chronic atrial fibrillation Rate controlled with amiodarone and Cardizem Not a candidate for anticoagulation  -Tobacco abuse On nicotine patch  -DVT prophylaxis with sequential compression device to lower extremities  -Overall prognosis poor Discussed with patient's husband  -Ambulatory dysfunction physical deconditioning Physical therapy evaluation for strengthening   All the records are reviewed and case discussed with Care Management/Social Worker. Management plans discussed with the patient, family and they are in  agreement.  CODE STATUS: Full code  DVT Prophylaxis: SCDs  TOTAL TIME TAKING CARE OF THIS PATIENT: 34 minutes.   POSSIBLE D/C IN 2 to 3 DAYS, DEPENDING ON CLINICAL CONDITION.  Berthold Glace  M.D on 11/13/2018 at 11:42 AM  Between 7am to 6pm - Pager - [phone]  After 6pm go to [email or web address] - password [email or web address] [facility]  Office  [phone]  CC: Primary care physician; [place]  Note: This dictation was prepared with Dragon dictation along with smaller Company secretary. Any transcriptional errors that result from this process are unintentional.

## 2018-11-13 NOTE — Progress Notes (Signed)
PT Cancellation Note  Patient Details Name: [name] MRN: [id number] DOB: [birth year]   Cancelled Treatment:    Reason Eval/Treat Not Completed: Medical issues which prohibited therapy;Other (comment)(Low potassium, 2.7. Will follow up as able when patient is more medically appropriate.)  Lieutenant Diego PT, DPT 1:14 PM,11/13/18 [phone]

## 2018-11-13 NOTE — Progress Notes (Signed)
Prime doc notified of potassium 2.7, orders given

## 2018-11-14 DIAGNOSIS — G9340 Encephalopathy, unspecified: Secondary | ICD-10-CM

## 2018-11-14 DIAGNOSIS — Z7189 Other specified counseling: Secondary | ICD-10-CM

## 2018-11-14 DIAGNOSIS — Z515 Encounter for palliative care: Secondary | ICD-10-CM

## 2018-11-14 DIAGNOSIS — R000 Tachycardia, unspecified: Secondary | ICD-10-CM

## 2018-11-14 LAB — SODIUM, URINE, RANDOM: Sodium, Ur: <10 mmol/L

## 2018-11-14 LAB — BASIC METABOLIC PANEL
Anion gap: 13 (ref 5–15)
BUN: 30 mg/dL — ABNORMAL HIGH (ref 8–23)
CALCIUM: 9.4 mg/dL (ref 8.9–10.3)
CO2: 24 mmol/L (ref 22–32)
CREATININE: 2.54 mg/dL — AB (ref 0.44–1.00)
Chloride: 103 mmol/L (ref 98–111)
GFR calc Af Amer: 22 mL/min — ABNORMAL LOW (ref >60)
GFR calc non Af Amer: 19 mL/min — ABNORMAL LOW (ref >60)
GLUCOSE: 116 mg/dL — AB (ref 70–99)
Potassium: 2.8 mmol/L — ABNORMAL LOW (ref 3.5–5.1)
Sodium: 140 mmol/L (ref 135–145)

## 2018-11-14 LAB — CHLORIDE, URINE, RANDOM: Chloride Urine: 17 mmol/L

## 2018-11-14 LAB — CYTOLOGY - NON PAP

## 2018-11-14 LAB — MAGNESIUM: Magnesium: 1.6 mg/dL — ABNORMAL LOW (ref 1.7–2.4)

## 2018-11-14 LAB — PHOSPHORUS: PHOSPHORUS: 2.7 mg/dL (ref 2.5–4.6)

## 2018-11-14 MED ORDER — POTASSIUM CHLORIDE 10 MEQ/100ML IV SOLN
10.0000 meq | INTRAVENOUS | Status: AC
  Administered 2018-11-14 – 2018-11-15 (×4): 10 meq via INTRAVENOUS
  Filled 2018-11-14 (×4): qty 100

## 2018-11-14 MED ORDER — POTASSIUM CHLORIDE 20 MEQ PO PACK: 40.0000 meq | PACK | ORAL | Status: DC

## 2018-11-14 MED ORDER — POTASSIUM CHLORIDE 10 MEQ/100ML IV SOLN: 10.0000 meq | INTRAVENOUS | Status: DC

## 2018-11-14 MED ORDER — MAGNESIUM SULFATE 2 GM/50ML IV SOLN
2.0000 g | Freq: Once | INTRAVENOUS | Status: AC
  Administered 2018-11-14: 2 g via INTRAVENOUS
  Filled 2018-11-14: qty 50

## 2018-11-14 MED ORDER — ALBUMIN HUMAN 25 % IV SOLN
12.5000 g | Freq: Two times a day (BID) | INTRAVENOUS | Status: DC
  Administered 2018-11-14 – 2018-11-18 (×10): 12.5 g via INTRAVENOUS
  Filled 2018-11-14 (×12): qty 50

## 2018-11-14 MED ORDER — RIFAXIMIN 550 MG PO TABS
550.0000 mg | ORAL_TABLET | Freq: Two times a day (BID) | ORAL | Status: DC
  Administered 2018-11-14 – 2018-11-20 (×13): 550 mg via ORAL
  Filled 2018-11-14 (×14): qty 1

## 2018-11-14 MED ORDER — THIAMINE HCL 100 MG/ML IJ SOLN
200.0000 mg | Freq: Three times a day (TID) | INTRAVENOUS | Status: AC
  Administered 2018-11-14 – 2018-11-16 (×8): 200 mg via INTRAVENOUS
  Filled 2018-11-14 (×9): qty 2

## 2018-11-14 MED ORDER — METOPROLOL TARTRATE 5 MG/5ML IV SOLN: 5.0000 mg | Freq: Four times a day (QID) | INTRAVENOUS | Status: DC | PRN

## 2018-11-14 MED ORDER — SODIUM CHLORIDE 0.9 % IV SOLN
INTRAVENOUS | Status: DC | PRN
  Administered 2018-11-14 – 2018-11-15 (×2): 250 mL via INTRAVENOUS

## 2018-11-14 MED ORDER — POTASSIUM CHLORIDE CRYS ER 20 MEQ PO TBCR: 40.0000 meq | EXTENDED_RELEASE_TABLET | ORAL | Status: DC

## 2018-11-14 MED ORDER — MAGNESIUM SULFATE IN D5W 1-5 GM/100ML-% IV SOLN
1.0000 g | Freq: Once | INTRAVENOUS | Status: DC
  Filled 2018-11-14: qty 100

## 2018-11-14 MED ORDER — DILTIAZEM HCL 30 MG PO TABS
30.0000 mg | ORAL_TABLET | Freq: Three times a day (TID) | ORAL | Status: DC
  Administered 2018-11-14: 30 mg via ORAL
  Filled 2018-11-14 (×5): qty 1

## 2018-11-14 NOTE — Progress Notes (Signed)
[facility]  ROUNDING NOTE   Subjective:   Able to follow few basic commands and answer simple questions.  Oriented to self Worked with PT earlier today   Objective:  Vital signs in last 24 hours:  Temp:  [97.4 F (36.3 C)-98 F (36.7 C)] 98 F (36.7 C) (11/26 0535) Pulse Rate:  [87-104] 104 (11/26 0535) Resp:  [16-20] 18 (11/26 0535) BP: (91-96)/(50-67) 91/50 (11/26 0535) SpO2:  [98 %-99 %] 99 % (11/26 0535)  Weight change:  Filed Weights   11/09/18 1321  Weight: 65.3 kg    Intake/Output: I/O last 3 completed shifts: In: 1418.1 [P.O.:80; I.V.:1008; IV Piggyback:330.1] Out: 455 [Urine:305; Emesis/NG output:150]   Intake/Output this shift:  No intake/output data recorded.  Physical Exam: General: NAD,   Head/ENT Normocephalic, atraumatic. Moist oral mucosal membranes  Neck: Supple, trachea midline  Lungs:  Clear to auscultation  Heart: Regular rate and rhythm  Abdomen:  +Tense ascites  Extremities:  trace peripheral edema.  Neurologic: Nonfocal, moving all four extremities, alert, oriented to self only        Basic Metabolic Panel: Recent Labs  Lab 11/10/18 0324 11/11/18 0531 11/12/18 0435 11/13/18 0321 11/14/18 0526  NA 126* 131* 134* 138 140  K 3.9 3.4* 3.1* 2.7* 2.8*  CL 98 101 104 104 103  CO2 18* 17* 20* 25 24  GLUCOSE 78 64* 121* 133* 116*  BUN 36* 37* 35* 34* 30*  CREATININE 2.54* 2.60* 2.58* 2.38* 2.54*  CALCIUM 7.8* 8.1* 8.4* 8.7* 9.4    Liver Function Tests: Recent Labs  Lab 11/09/18 1318 11/12/18 0435 11/13/18 0321  AST 48* 27 26  ALT 24 11 11   ALKPHOS 115 53 44  BILITOT 1.2 1.3* 1.1  PROT 5.8* 4.7* 4.9*  ALBUMIN 2.0* 3.0* 3.3*   No results for input(s): LIPASE, AMYLASE in the last 168 hours. Recent Labs  Lab 11/09/18 1318  AMMONIA 21    CBC: Recent Labs  Lab 11/09/18 1318 11/10/18 0324 11/13/18 0321  WBC 9.6 12.5* 4.1  NEUTROABS 7.2  --   --   HGB 13.2 11.9* 9.5*  HCT 38.8 36.0 28.2*  MCV 97.5 97.8  98.3  PLT 330 324 233    Cardiac Enzymes: Recent Labs  Lab 11/09/18 1318  TROPONINI <0.03    BNP: Invalid input(s): POCBNP  CBG: No results for input(s): GLUCAP in the last 168 hours.  Microbiology: Results for orders placed or performed during the hospital encounter of 11/09/18  Body fluid culture     Status: None   Collection Time: 11/10/18 12:19 PM  Result Value Ref Range Status   Specimen Description   Final    PERITONEAL Performed at [facility], [address] [place], [place] [address]    Special Requests   Final    NONE Performed at [facility], [address] [place], [place] [address]    Gram Stain   Final    WBC PRESENT,BOTH PMN AND MONONUCLEAR NO ORGANISMS SEEN CYTOSPIN SMEAR    Culture   Final    NO GROWTH 3 DAYS Performed at [facility], [place] [address], [place], [place] [address]    Report Status 11/13/2018 FINAL  Final  Urine Culture     Status: Abnormal   Collection Time: 11/10/18  4:28 PM  Result Value Ref Range Status   Specimen Description   Final    URINE, RANDOM Performed at [facility], [address], [place], [place] [address]  Special Requests   Final    NONE Performed at [facility], [place], [place] [address]    Culture 10,000 COLONIES/mL YEAST (A)  Final   Report Status 11/12/2018 FINAL  Final    Coagulation Studies: No results for input(s): LABPROT, INR in the last 72 hours.  Urinalysis: No results for input(s): COLORURINE, LABSPEC, PHURINE, GLUCOSEU, HGBUR, BILIRUBINUR, KETONESUR, PROTEINUR, UROBILINOGEN, NITRITE, LEUKOCYTESUR in the last 72 hours.  Invalid input(s): APPERANCEUR    Imaging: No results found.   Medications:   . albumin human 60 mL/hr at 11/14/18 0000  . cefTRIAXone (ROCEPHIN)  IV Stopped (11/13/18 1657)   . feeding supplement (ENSURE ENLIVE)  237 mL Oral BID BM  . folic acid  1 mg Oral Daily  . lactulose  30 g Oral TID  .  midodrine  5 mg Oral TID WC  . multivitamin with minerals  1 tablet Oral Daily  . nicotine  21 mg Transdermal Daily  . octreotide  100 mcg Subcutaneous TID  . spironolactone  25 mg Oral Daily  . thiamine  100 mg Oral Daily   Or  . thiamine  100 mg Intravenous Daily  . venlafaxine  37.5 mg Oral BID WC   acetaminophen **OR** acetaminophen, guaiFENesin-dextromethorphan, ondansetron **OR** ondansetron (ZOFRAN) IV, senna-docusate  Assessment/ Plan:  [name] is a 62 y.o. white female [name] is a 62 y.o. white female with hepatic cirrhosis, atrial fibrillation, EtOH, depression, who was admitted to [facility] on 11/09/2018 for Hyponatremia and acute renal failure  1. Hyponatremia: secondary to hepatic cirrhosis.  - Avoid IV fluids as ascites is reaccumulating.  - Na corrected to 140  2. Acute Renal Failure:  Getting albumin for volume expansion.  However there is concern for hepatorenal syndrome Creatinine still high Baseline creatinine of 0.88 on 10/29/18  - Contonue albumin, reduce dose to 12.5 gm BID - Continue midodrine - Continue octreotide.   3. Hypokalemia -  Add spironolactone - may supplement orally or iv  4. Edema, cirrhosis and ascites: status post large volume paracentesis on 11/22  5 liters removed.   4. Acute hepatic encephalopathy - lactulose   LOS: 5 [name] 11/26/20198:29 AM

## 2018-11-14 NOTE — Progress Notes (Signed)
[place] at [place] NAME: [name]    MR#:  [id number]  DATE OF BIRTH:  1955-12-27  SUBJECTIVE:  CHIEF COMPLAINT:   Chief Complaint  Patient presents with  . Altered Mental Status  Patient seen today Responds to verbal commands Has poor oral intake Has periods of confusion Abdominal distention secondary to ascites No fever   REVIEW OF SYSTEMS:    ROS  Could not be obtained secondary to on and off confusion  DRUG ALLERGIES:  No Known Allergies  VITALS:  Blood pressure 93/66, pulse 94, temperature 98.2 F (36.8 C), temperature source Oral, resp. rate 14, height 5\' 9"  (1.753 m), weight 65.3 kg, SpO2 99 %.  PHYSICAL EXAMINATION:   Physical Exam  GENERAL:  62 y.o.-year-old patient lying in the bed  EYES: Pupils equal, round, reactive to light and accommodation. No scleral icterus. Extraocular muscles intact.  HEENT: Head atraumatic, normocephalic. Oropharynx and nasopharynx clear.  NECK:  Supple, no jugular venous distention. No thyroid enlargement, no tenderness.  LUNGS: Normal breath sounds bilaterally, no wheezing, rales, rhonchi. No use of accessory muscles of respiration.  CARDIOVASCULAR: S1, S2 normal. No murmurs, rubs, or gallops.  ABDOMEN: Soft, nontender, has distension and ascites. Bowel sounds present. No organomegaly or mass.  EXTREMITIES: No cyanosis, clubbing  1 plus edema b/l.    NEUROLOGIC: Cranial nerves II through XII are intact. No focal Motor or sensory deficits b/l.   PSYCHIATRIC: Oriented to self and place SKIN: No obvious rash, lesion, or ulcer.   LABORATORY PANEL:   CBC Recent Labs  Lab 11/13/18 0321  WBC 4.1  HGB 9.5*  HCT 28.2*  PLT 233   ------------------------------------------------------------------------------------------------------------------ Chemistries  Recent Labs  Lab 11/13/18 0321 11/14/18 0526  NA 138 140  K 2.7* 2.8*  CL 104 103  CO2 25 24  GLUCOSE 133* 116*  BUN  34* 30*  CREATININE 2.38* 2.54*  CALCIUM 8.7* 9.4  MG  --  1.6*  AST 26  --   ALT 11  --   ALKPHOS 44  --   BILITOT 1.1  --    ------------------------------------------------------------------------------------------------------------------  Cardiac Enzymes Recent Labs  Lab 11/09/18 1318  TROPONINI <0.03   ------------------------------------------------------------------------------------------------------------------  RADIOLOGY:  No results found.   ASSESSMENT AND PLAN:  62 year old female patient with history of tobacco abuse, alcohol abuse, liver cirrhosis, atrial fibrillation currently under hospitalist service for altered mental status and abdominal distention  -Decompensated liver cirrhosis with ascites status post large volume paracentesis Status post GI evaluation and follow-up Continue albumin, octreotide and midodrine  -Acute encephalopathy  Possibly Wernicke's from chronic alcohol use Thiamine supplementation Multifactorial in etiology Continue oral lactulose to avoid hepatic encephalopathy On IV Rocephin antibiotic D5 Will discontinue antibiotic today  -Hyponatremia improved Discontinued IV fluids to avoid fluid overload Appreciate nephrology follow-up Sodium tablets deferred to avoid worsening of ascites  -Metabolic acidosis improved off IV sodium bicarbonate fluids  -Acute renal failure Creatinine remaining stable Probability of hepatorenal syndrome Continue oral midodrine Continue octreotide Continue albumin  -Acute hypokalemia Replace potassium aggressively Appreciate pharmacy intervention  -Acute hypomagnesemia Replace magnesium intravenously  -Chronic atrial fibrillation Resume  Cardizem at low dose in view of borderling BP Not a candidate for anticoagulation  -Tobacco abuse On nicotine patch  -DVT prophylaxis with sequential compression device to lower extremities  -Overall long term prognosis poor Discussed with patient's  husband medical condition and treatment plan Palliative medicine consult  -Ambulatory dysfunction physical deconditioning Physical therapy evaluation for  strengthening   All the records are reviewed and case discussed with Care Management/Social Worker. Management plans discussed with the patient, family and they are in agreement.  CODE STATUS: Full code  DVT Prophylaxis: SCDs  TOTAL TIME TAKING CARE OF THIS PATIENT: 38 minutes.   POSSIBLE D/C IN 2 to 3 DAYS, DEPENDING ON CLINICAL CONDITION.  [name] M.D on 11/14/2018 at 11:19 AM  Between 7am to 6pm - Pager - [phone]  After 6pm go to [email or web address] - password [email or web address] [facility]  Office  [phone]  CC: Primary care physician; [place]  Note: This dictation was prepared with Dragon dictation along with smaller Company secretary. Any transcriptional errors that result from this process are unintentional.

## 2018-11-14 NOTE — Progress Notes (Signed)
PT Cancellation Note  Patient Details Name: [name] MRN: [id number] DOB: 1956-09-24   Cancelled Treatment:    Reason Eval/Treat Not Completed: Patient not medically ready   Chart reviewed.  Potassium remains low at 2.8.  Will hold at this time and continue as appropriate.  Will attempt later today as time allows if levels increase.   Chesley Noon 11/14/2018, 7:59 AM

## 2018-11-14 NOTE — Progress Notes (Signed)
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   Medical History: Past Medical History:  Diagnosis Date  . Cancer ([place])    skin  . Depression   . ETOH abuse     Medications:  Scheduled:  . feeding supplement (ENSURE ENLIVE)  237 mL Oral BID BM  . folic acid  1 mg Oral Daily  . lactulose  30 g Oral TID  . midodrine  5 mg Oral TID WC  . multivitamin with minerals  1 tablet Oral Daily  . nicotine  21 mg Transdermal Daily  . octreotide  100 mcg Subcutaneous TID  . potassium chloride  40 mEq Oral Q4H  . spironolactone  25 mg Oral Daily  . venlafaxine  37.5 mg Oral BID WC   Infusions:  . albumin human    . cefTRIAXone (ROCEPHIN)  IV Stopped (11/13/18 1657)  . thiamine 500mg  in normal saline (29ml) IVPB     PRN: acetaminophen **OR** acetaminophen, guaiFENesin-dextromethorphan, ondansetron **OR** ondansetron (ZOFRAN) IV, senna-docusate  Assessment: 62 year old female patient with history of tobacco abuse, alcohol abuse, liver cirrhosis, atrial fibrillation currently under hospitalist service for altered mental status and abdominal distention.  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 Patient also has edema and ascites will limited IV fluids. Doses of thiamine > 100 mg need to be given by slow infusion. Patient has received thiamine 100 mg PO x 4 days.    Plan:  Will give IV thiamine 200 mg IVPB TID for 3 days.  If patient clinically improves - switch to thiamine 100 mg daily for 1-2 weeks.   Oswald Hillock, PharmD  11/14/2018,10:21 AM

## 2018-11-14 NOTE — Consult Note (Signed)
Consultation Note Date: 11/14/2018   Patient Name: [name]  DOB: Mar 07, 1956  MRN: [id number]  Age / Sex: 62 y.o., female  PCP: [facility] Referring Physician: [name], MD  Reason for Consultation: Establishing goals of care and Psychosocial/spiritual support  HPI/Patient Profile: 62 y.o. female  admitted on 11/09/2018 with a past medical  history of tobacco abuse, alcohol abuse, atrial fibrillation who  presented to the emergency room for confusion.  She has abdominal distention and  discomfort.  Patient drinks alcohol/  1 liter of vodka a day per husband   and last drink was 2 days ago.  She also is an active smoker.   CT scan of the abdomen showed cirrhosis with severe ascites, thickening of the distal esophagus and significant wall and fold thickening of the proximal stomach concerning for esophagitis or gastritis.  Sigmoid colon diverticulosis.  Per husband  PCP is  Dr Shelly Rubenstein an area clinic in [facility]  Admitted for evaluation and treatment.  Family face treatment option decision, advanced directive decisions and anticipatory care needs.   Clinical Assessment and Goals of Care:  This NP Wadie Lessen reviewed medical records, received report from team, assessed the patient and then from the patient's bedside  spoke by telephone with her husband/ [name] to discuss diagnosis, prognosis, [place], EOL wishes disposition and options.  Patient has no children and has been married to Mr Mcilhenny for two years.  Concept of Hospice and Palliative Care were discussed  A  discussion was had today regarding advanced directives.  Concepts specific to code status, artifical feeding and hydration, continued IV antibiotics and rehospitalization was had.    Values and goals of care important to patient and family were attempted to be elicited.  Questions and concerns addressed.    Family encouraged to call with questions or concerns.    PMT will continue to support holistically.   NEXT OF KIN/husband    SUMMARY OF RECOMMENDATIONS    Code Status/Advance Care Planning:  Full code   Open to all offered and available medical interventions to prolong life.  Currently patient is without capacity to make medical decisions for herself.   Palliative Prophylaxis:   Aspiration, Bowel Regimen, Delirium Protocol, Frequent Pain Assessment and Oral Care  Additional Recommendations (Limitations, Scope, Preferences):  Full Scope Treatment  Psycho-social/Spiritual:   Desire for further Chaplaincy support:no-declined by husband  Additional Recommendations:  Created space and opportunity for husband to explore his thoughts and feeling regarding his wife's current medical situation. Care needs on discharge is a main issue.    Prognosis:   Unable to determine  Discharge Planning: Currently patient is without medical insurance   To Be Determined     Primary Diagnoses: Present on Admission: . Renal failure   I have reviewed the medical record, interviewed the patient and family, and examined the patient. The following aspects are pertinent.  Past Medical History:  Diagnosis Date  . Cancer ([place])    skin  . Depression   . ETOH abuse  Social History   Socioeconomic History  . Marital status: Married    Spouse name: Not on file  . Number of children: Not on file  . Years of education: Not on file  . Highest education level: Not on file  Occupational History  . Not on file  Social Needs  . Financial resource strain: Not on file  . Food insecurity:    Worry: Not on file    Inability: Not on file  . Transportation needs:    Medical: Not on file    Non-medical: Not on file  Tobacco Use  . Smoking status: Current Every Day Smoker    Packs/day: 0.25    Types: Cigarettes  . Smokeless tobacco: Never Used  Substance and Sexual Activity  .  Alcohol use: Yes    Alcohol/week: 1.0 standard drinks    Types: 1 Shots of liquor per week  . Drug use: No  . Sexual activity: Never  Lifestyle  . Physical activity:    Days per week: Not on file    Minutes per session: Not on file  . Stress: Not on file  Relationships  . Social connections:    Talks on phone: Not on file    Gets together: Not on file    Attends religious service: Not on file    Active member of club or organization: Not on file    Attends meetings of clubs or organizations: Not on file    Relationship status: Not on file  Other Topics Concern  . Not on file  Social History Narrative  . Not on file   Family History  Problem Relation Age of Onset  . Breast cancer Neg Hx    Scheduled Meds: . diltiazem  30 mg Oral Q8H  . feeding supplement (ENSURE ENLIVE)  237 mL Oral BID BM  . folic acid  1 mg Oral Daily  . lactulose  30 g Oral TID  . midodrine  5 mg Oral TID WC  . multivitamin with minerals  1 tablet Oral Daily  . nicotine  21 mg Transdermal Daily  . octreotide  100 mcg Subcutaneous TID  . spironolactone  25 mg Oral Daily  . venlafaxine  37.5 mg Oral BID WC   Continuous Infusions: . albumin human 12.5 g (11/14/18 1130)  . cefTRIAXone (ROCEPHIN)  IV Stopped (11/13/18 1657)  . magnesium sulfate 1 - 4 g bolus IVPB    . potassium chloride    . thiamine 500mg  in normal saline (66ml) IVPB 200 mg (11/14/18 1249)   PRN Meds:.acetaminophen **OR** acetaminophen, guaiFENesin-dextromethorphan, metoprolol tartrate, ondansetron **OR** ondansetron (ZOFRAN) IV, senna-docusate Medications Prior to Admission:  Prior to Admission medications   Medication Sig Start Date End Date Taking? Authorizing Provider  diltiazem (CARDIZEM CD) 120 MG 24 hr capsule Take 1 capsule (120 mg total) by mouth daily. 05/22/18  Yes Vaughan Basta, MD  feeding supplement, ENSURE ENLIVE, (ENSURE ENLIVE) LIQD Take 237 mLs by mouth 2 (two) times daily between meals. 10/29/18  Yes Salary,  Avel Peace, MD  midodrine (PROAMATINE) 5 MG tablet Take 1 tablet (5 mg total) by mouth 3 (three) times daily with meals. 10/29/18  Yes Salary, Montell D, MD  thiamine 100 MG tablet Take 1 tablet (100 mg total) by mouth daily. 10/29/18  Yes Salary, Avel Peace, MD  traZODone (DESYREL) 100 MG tablet Take 100 mg by mouth at bedtime.   Yes [provider]  venlafaxine (EFFEXOR) 75 MG tablet Take 75 mg by mouth 3 (  three) times daily.   Yes [provider]  amiodarone (PACERONE) 200 MG tablet Take 1 tablet (200 mg total) by mouth daily. Patient not taking: Reported on 11/09/2018 10/29/18   Salary, Avel Peace, MD  apixaban (ELIQUIS) 5 MG TABS tablet Take 1 tablet (5 mg total) by mouth 2 (two) times daily. Patient not taking: Reported on 11/09/2018 10/29/18   Salary, Avel Peace, MD  folic acid (FOLVITE) 1 MG tablet Take 1 tablet (1 mg total) by mouth daily. Patient not taking: Reported on 10/24/2018 05/22/18   Vaughan Basta, MD  Multiple Vitamin (MULTIVITAMIN WITH MINERALS) TABS tablet Take 1 tablet by mouth daily. Patient not taking: Reported on 11/09/2018 10/29/18   Salary, Holly Bodily D, MD  sodium chloride 1 g tablet Take 1 tablet (1 g total) by mouth 2 (two) times daily with a meal. Patient not taking: Reported on 11/09/2018 10/29/18   Salary, Avel Peace, MD  venlafaxine (EFFEXOR) 37.5 MG tablet Take 1 tablet (37.5 mg total) by mouth 2 (two) times daily with a meal. Patient not taking: Reported on 11/09/2018 10/29/18   Salary, Avel Peace, MD   No Known Allergies Review of Systems  Unable to perform ROS: Mental status change    Physical Exam  Constitutional: She appears well-developed. She appears lethargic. She appears ill.  Cardiovascular: Normal rate, regular rhythm and normal heart sounds.  Pulmonary/Chest: She has decreased breath sounds.  Abdominal: She exhibits ascites. There is tenderness.  Neurological: She appears lethargic.  Skin: Skin is warm and dry.    Vital  Signs: BP 93/66 (BP Location: Right Arm)   Pulse 94   Temp 98.2 F (36.8 C) (Oral)   Resp 14   Ht 5\' 9"  (1.753 m)   Wt 65.3 kg   SpO2 99%   BMI 21.27 kg/m  Pain Scale: 0-10 POSS *See Group Information*: S-Acceptable,Sleep, easy to arouse Pain Score: 0-No pain   SpO2: SpO2: 99 % O2 Device:SpO2: 99 % O2 Flow Rate: .   IO: Intake/output summary:   Intake/Output Summary (Last 24 hours) at 11/14/2018 1317 Last data filed at 11/14/2018 1100 Gross per 24 hour  Intake 222.85 ml  Output 355 ml  Net -132.15 ml    LBM: Last BM Date: 11/14/18 Baseline Weight: Weight: 65.3 kg Most recent weight: Weight: 65.3 kg     Palliative Assessment/Data: 30% at best   Discussed with Dr Estanislado Pandy  Time In: 1500 Time Out: 1600 Time Total: 60 minutes Greater than 50%  of this time was spent counseling and coordinating care related to the above assessment and plan.  Signed by: Wadie Lessen, NP   Please contact Palliative Medicine Team phone at [phone] for questions and concerns.  For individual provider: See Shea Evans

## 2018-11-14 NOTE — Evaluation (Signed)
Occupational Therapy Evaluation Patient Details Name: [name] MRN: [id number] DOB: Mar 14, 1956 Today's Date: 11/14/2018    History of Present Illness 62 yo female with onset of AMS had 5L fluid removed in paracentesis on 11/22.  Pt admitted for general weakness, at baseline was wearing paper undergarments and not clear if she was ambulatory.  PMHx:  recent admission for sepsis UTI, skin CA, cirrhosis, depression, EtOH abuse, tobacco abuse,    Clinical Impression   Pt seen for OT evaluation this date. Pt presented in bed without family member present. Although alert, pt seemed somewhat confused and had visual hallucinations during session. Pt mildy agitated and confused when ask to move to EOB. Pt refused stating " We have gone over this before, I told you I am not ready for this" although this conversation had not occurred with OT. Pt A&O x2 and needed VC to initiate simple one step commands. Pt unable to lift BUE finger to nose or lift cup to mouth without assistance. Pt required extra time and effort and repeated VC to initiate BUE/BLE movements.  Currently pt demonstrates impairments in (See OT Problem List below) requiring MAX/MOD assist for ADL and mobility.  Pt would benefit from skilled OT to address noted impairments and functional limitationsin order to maximize safety and independence while minimizing falls risk and caregiver burden.  Upon hospital discharge, recommend pt discharge to SNF.    Follow Up Recommendations  SNF    Equipment Recommendations  None recommended by OT    Recommendations for Other Services       Precautions / Restrictions Precautions Precautions: Fall Required Braces or Orthoses: Other Brace/Splint Other Brace/Splint: soft boots to protect heels Restrictions Weight Bearing Restrictions: No      Mobility Bed Mobility Overal bed mobility: Needs Assistance             General bed mobility comments: Pt refused to move to EOB becoming midly  agitated; stated "we have gone over this before and I told you I wasn't ready for this".   Transfers                 General transfer comment: deferred; assess next session    Balance                                           ADL either performed or assessed with clinical judgement   ADL Overall ADL's : Needs assistance/impaired Eating/Feeding: Moderate assistance;Bed level   Grooming: Bed level;Moderate assistance;Maximal assistance   Upper Body Bathing: Maximal assistance;Bed level   Lower Body Bathing: Maximal assistance;Bed level   Upper Body Dressing : Maximal assistance;Bed level   Lower Body Dressing: Maximal assistance;Bed level     Toilet Transfer Details (indicate cue type and reason): deferred; assess next session         Functional mobility during ADLs: (deferre: assess next session)       Vision         Perception     Praxis      Pertinent Vitals/Pain Pain Assessment: No/denies pain(Pt denied pain but asked for Prilosec for stomach. RN notofied; Prilosec not on her list of medications.) Pain Intervention(s): Limited activity within patient's tolerance     Hand Dominance Right   Extremity/Trunk Assessment Upper Extremity Assessment Upper Extremity Assessment: Generalized weakness(AROM in BUE; 75% flexion/extension L shoulder AROM, 50% flexion/extension R shoulder) RUE Deficits /  Details: grossly 3-/5 strength LUE Deficits / Details: grossly 3-/5 strength   Lower Extremity Assessment Lower Extremity Assessment: Generalized weakness(Pt able to lift BLE ~ 1 inch off bed x2 )       Communication Communication Communication: No difficulties   Cognition Arousal/Alertness: Awake/alert Behavior During Therapy: Agitated Overall Cognitive Status: Impaired/Different from baseline Area of Impairment: Orientation;Following commands                 Orientation Level: Disoriented to;Time;Situation     Following  Commands: Follows one step commands inconsistently;Follows one step commands with increased time       General Comments: Pt has history of falls; had visual hallucinations during session; required VC and extra time to complete one step commands   General Comments       Exercises Other Exercises Other Exercises: Pt educated in the importance of moving in the bed and participated in AROM shoulder flexion/extension exercises for BUE.    Shoulder Instructions      Home Living Family/patient expects to be discharged to:: Private residence Living Arrangements: Spouse/significant other Available Help at Discharge: Family Type of Home: Apartment Home Access: Level entry     Home Layout: One level     Bathroom Shower/Tub: Teacher, early years/pre: Handicapped height Bathroom Accessibility: Yes   Home Equipment: Bedside commode;Cane - single point;Tub bench;Hand held shower head;Walker - 2 wheels   Additional Comments: mult falls in recent history      Prior Functioning/Environment Level of Independence: Needs assistance  Gait / Transfers Assistance Needed: pt  using RW per her report ADL's / Homemaking Assistance Needed: Not clear how much assistance pt received at home for ADL tasks.             OT Problem List: Decreased strength;Decreased safety awareness;Impaired balance (sitting and/or standing);Decreased range of motion;Decreased cognition;Decreased knowledge of use of DME or AE;Impaired UE functional use      OT Treatment/Interventions: Self-care/ADL training;Cognitive remediation/compensation;Balance training;Therapeutic exercise;Patient/family education;Therapeutic activities;DME and/or AE instruction    OT Goals(Current goals can be found in the care plan section) Acute Rehab OT Goals Patient Stated Goal: To go home OT Goal Formulation: With patient Time For Goal Achievement: 11/28/18 Potential to Achieve Goals: Fair ADL Goals Additional ADL Goal #1:  Pt will participate in rolling side to side with MOD A  to increase participation in bed level ADL Additional ADL Goal #2: Pt will self feed with MIN A  OT Frequency: Min 2X/week   Barriers to D/C:            Co-evaluation              AM-PAC OT "6 Clicks" Daily Activity     Outcome Measure Help from another person eating meals?: A Lot Help from another person taking care of personal grooming?: A Lot Help from another person toileting, which includes using toliet, bedpan, or urinal?: A Lot Help from another person bathing (including washing, rinsing, drying)?: A Lot Help from another person to put on and taking off regular upper body clothing?: A Lot Help from another person to put on and taking off regular lower body clothing?: A Lot 6 Click Score: 12   End of Session    Activity Tolerance: Treatment limited secondary to agitation Patient left: in bed;with call bell/phone within reach;with bed alarm set;with SCD's reapplied(boots/splints put on )  OT Visit Diagnosis: Repeated falls (R29.6);Other abnormalities of gait and mobility (R26.89);Muscle weakness (generalized) (M62.81)  Time: 0813-8871 OT Time Calculation (min): 28 min Charges:     Jadene Pierini OTS  11/14/2018, 10:50 AM

## 2018-11-14 NOTE — Progress Notes (Signed)
Vonda Antigua, MD [address], [place], [place], [place], [address] [address], [place], [place], [place], [address] Phone: [phone]  Fax: [phone]   Subjective: Pt laying in bed comfortably. Oriented to person.    Objective: Exam: Vital signs in last 24 hours: Vitals:   11/13/18 1154 11/13/18 2004 11/14/18 0535 11/14/18 0847  BP: 95/62 96/67 (!) 91/50 93/66  Pulse: 87 89 (!) 104 94  Resp: 20 16 18 14   Temp: (!) 97.4 F (36.3 C) 97.6 F (36.4 C) 98 F (36.7 C) 98.2 F (36.8 C)  TempSrc: Oral Oral Oral Oral  SpO2: 98% 99% 99% 99%  Weight:      Height:       Weight change:   Intake/Output Summary (Last 24 hours) at 11/14/2018 1213 Last data filed at 11/14/2018 0900 Gross per 24 hour  Intake 222.85 ml  Output 255 ml  Net -32.15 ml    General: No acute distress, AAO x3 Abd: Soft, distended, nontender, No HSM Skin: Warm, no rashes Neck: Supple, Trachea midline Neuro: No asterixis  Lab Results: Lab Results  Component Value Date   WBC 4.1 11/13/2018   HGB 9.5 (L) 11/13/2018   HCT 28.2 (L) 11/13/2018   MCV 98.3 11/13/2018   PLT 233 11/13/2018   Micro Results: Recent Results (from the past 240 hour(s))  Body fluid culture     Status: None   Collection Time: 11/10/18 12:19 PM  Result Value Ref Range Status   Specimen Description   Final    PERITONEAL Performed at [facility], [address] [place], [place] [address]    Special Requests   Final    NONE Performed at [facility], [address] [place], [place] [address]    Gram Stain   Final    WBC PRESENT,BOTH PMN AND MONONUCLEAR NO ORGANISMS SEEN CYTOSPIN SMEAR    Culture   Final    NO GROWTH 3 DAYS Performed at [facility], [place] [address], [place], [place] [address]    Report Status 11/13/2018 FINAL  Final  Fungus Culture With Stain     Status: None (Preliminary result)   Collection Time: 11/10/18 12:19 PM  Result Value Ref Range Status   Fungus Stain Final report  Final    Comment: (NOTE) Performed At: [facility] [place], [place] [id number] Rush Farmer MD [id number]    Fungus (Mycology) Culture PENDING  Incomplete   Fungal Source PERITONEAL  Final    Comment: Performed at [facility], [place] [place], [place] [address]  Fungus Culture Result     Status: None   Collection Time: 11/10/18 12:19 PM  Result Value Ref Range Status   Result 1 Comment  Final    Comment: (NOTE) KOH/Calcofluor preparation:  no fungus observed. Performed At: [facility] [place], [place] [id number] Rush Farmer MD [id number]   Urine Culture     Status: Abnormal   Collection Time: 11/10/18  4:28 PM  Result Value Ref Range Status   Specimen Description   Final    URINE, RANDOM Performed at [facility], [address], [place], [place] [address]    Special Requests   Final    NONE Performed at [facility], [place], [place] [address]    Culture 10,000 COLONIES/mL YEAST (A)  Final   Report Status 11/12/2018 FINAL  Final   Studies/Results: No results found. Medications:  Scheduled Meds: .  diltiazem  30 mg Oral Q8H  . feeding supplement (ENSURE ENLIVE)  237 mL Oral BID BM  . folic acid  1 mg Oral Daily  . lactulose  30 g Oral TID  . midodrine  5 mg Oral TID WC  . multivitamin with minerals  1 tablet Oral Daily  . nicotine  21 mg Transdermal Daily  . octreotide  100 mcg Subcutaneous TID  . spironolactone  25 mg Oral Daily  . venlafaxine  37.5 mg Oral BID WC   Continuous Infusions: . albumin human 12.5 g (11/14/18 1130)  . cefTRIAXone (ROCEPHIN)  IV Stopped (11/13/18 1657)  . magnesium sulfate 1 - 4 g bolus IVPB    . potassium chloride    . thiamine 500mg  in normal saline (8ml) IVPB     PRN Meds:.acetaminophen **OR** acetaminophen, guaiFENesin-dextromethorphan, metoprolol tartrate, ondansetron **OR** ondansetron  (ZOFRAN) IV, senna-docusate   Assessment: Cirrhosis Ascites   Plan: Abdomen is distended, but not tense Last paracentesis was 5 days ago Would recommend to repeat paracentesis in 1 to 2 days  Start rifaximin twice daily Continue lactulose titration to no more than 2-3 bowel movements a day  Sodium restriction to less than 2 g a day  From a GI standpoint she does not need to be on a clear liquid diet.  Primary team can advance diet to solids if no other contraindications to advance diet.   LOS: 5 days   Vonda Antigua, MD 11/14/2018, 12:13 PM

## 2018-11-14 NOTE — Progress Notes (Signed)
Initial Nutrition Assessment  DOCUMENTATION CODES:   Non-severe (moderate) malnutrition in context of chronic illness  INTERVENTION:   Ensure Enlive po BID, each supplement provides 350 kcal and 20 grams of protein  MVI daily  Folic acid daily  Pt at high risk for Wernicke's; recommend 500 mg of thiamine intravenously, infused over 30 minutes, three times daily for two consecutive days and 250 mg intravenously once daily for an additional five days  Pending GOC, pt would benefit from NGT placement for meds and tube feeds as pt without adequate nutrition for > 5 days.   Pt at high refeeding risk; recommend monitor K, Mg and P labs once oral intake improves  NUTRITION DIAGNOSIS:   Moderate Malnutrition related to chronic illness(cirrhosis, etoh abuse) as evidenced by moderate fat depletion, moderate to severe muscle depletions.  GOAL:   Patient will meet greater than or equal to 90% of their needs  MONITOR:   PO intake, Supplement acceptance, Labs, Weight trends, Skin, I & O's  REASON FOR ASSESSMENT:   NPO/Clear Liquid Diet    ASSESSMENT:   62 y.o. white female with hepatic cirrhosis, atrial fibrillation, EtOH, depression, who was admitted to [facility] on 11/09/2018 for Hyponatremia and acute renal failure   Pt s/p paracentesis with 5L fluid removal 11/22  Met with pt in room today. Pt with AMS and unable to provide any history. Pt refusing to take anything by mouth today. Pt was able to swallow her meds after much coaxing from RN this morning but then vomited them back up. Pt refusing to eat or drink anything but water. Pt with ascites and distended abdomen; pt keeps holding her stomach today as if she has pain. It is difficult to determine if patient has had any weight loss r/t ascites. Pt has now been without adequate nutrition now for > 5 days. Palliative care consult pending. If full scope of care is requested; recommend NGT placement for tube feeds and medication  administration. Radiograph from 11/21 suggesting SBO vs ileus; consider repeat abdominal xray r/o GI dysfunction.   Medications reviewed and include: folic acid, lactulose, MVI, nicotine, octreotide, KCl, aldactone, albumin, ceftriaxone, thiamine, zofran  Labs reviewed: K 2.8(L), BUN 30(H), creat 2.54(H), P 2.7 wnl, Mg 1.6(L) Hgb 9.5(L), Hct 28.2(L)- 11/25  NUTRITION - FOCUSED PHYSICAL EXAM:    Most Recent Value  Orbital Region  Mild depletion  Upper Arm Region  Moderate depletion  Thoracic and Lumbar Region  Moderate depletion  Buccal Region  Moderate depletion  Temple Region  Mild depletion  Clavicle Bone Region  Mild depletion  Clavicle and Acromion Bone Region  Mild depletion  Scapular Bone Region  Mild depletion  Dorsal Hand  Severe depletion  Patellar Region  Severe depletion  Anterior Thigh Region  Severe depletion  Posterior Calf Region  Severe depletion  Edema (RD Assessment)  Mild  Hair  Reviewed  Eyes  Reviewed  Mouth  Reviewed  Skin  Reviewed  Nails  Reviewed     Diet Order:   Diet Order            Diet clear liquid Room service appropriate? Yes; Fluid consistency: Thin  Diet effective now             EDUCATION NEEDS:   Not appropriate for education at this time  Skin:  Skin Assessment: Reviewed RN Assessment(incision abdomen s/p paracentesis, MASD)  Last BM:  11/25- type 7  Height:   Ht Readings from Last 1 Encounters:  11/09/18 _0  (  1.753 m)    Weight:   Wt Readings from Last 1 Encounters:  11/09/18 65.3 kg    Ideal Body Weight:  65.9 kg  BMI:  Body mass index is 21.27 kg/m.  Estimated Nutritional Needs:   Kcal:  1700-2000kcal/day   Protein:  85-98g/day   Fluid:  1.2L/day   Koleen Distance MS, RD, LDN Pager #- [phone] Office#- [phone] After Hours Pager: [phone]

## 2018-11-14 NOTE — Clinical Social Work Note (Signed)
Clinical Social Work Assessment  Patient Details  Name: [name] MRN: [id number] Date of Birth: 1956/10/14  Date of referral:  11/14/18               Reason for consult:  Discharge Planning                Permission sought to share information with:    Permission granted to share information::     Name::        Agency::     Relationship::     Contact Information:     Housing/Transportation Living arrangements for the past 2 months:  Apartment Source of Information:  Spouse Patient Interpreter Needed:  None Criminal Activity/Legal Involvement Pertinent to Current Situation/Hospitalization:  No - Comment as needed Significant Relationships:  Spouse Lives with:  Spouse Do you feel safe going back to the place where you live?    Need for family participation in patient care:  Yes (Comment)  Care giving concerns:  Patient resides at home with her husband.    Social Worker assessment / plan:  CSW contacted patient's husband: [phone] via phone this morning to discuss discharge planning. CSW discussed that PT is recommending short term rehab. CSW noted patient does not have any insurance and patient's husband confirmed this. Patient's husband has stated he cannot afford out of pocket. Patient's husband stated that patient's baseline is total care and that she is bed bound and is incontinent. CSW inquired if he had applied for medicaid for her and he stated she was denied and that they received a determination that she was denied a little over a year ago.   CSW confirmed with patient's husband that the plan would then be for patient to return home to continue her previous level of care.   Employment status:    Insurance information:  Self Pay (Medicaid Pending) PT Recommendations:  [place] / Referral to community resources:     Patient/Family's Response to care:  Patient's husband expressed appreciation for CSW call.  Patient/Family's Understanding  of and Emotional Response to Diagnosis, Current Treatment, and Prognosis:  Patient's husband asked that the physician contact him with an update on his wife's condition. CSW advised that CSW would inform Dr. Estanislado Pandy which has been done.  Emotional Assessment Appearance:  Appears stated age Attitude/Demeanor/Rapport:  (confused) Affect (typically observed):  Calm Orientation:  Oriented to Self Alcohol / Substance use:  Alcohol Use(has admitted to drinking in the past when she was oriented on previous admission) Psych involvement (Current and /or in the community):  No (Comment)  Discharge Needs  Concerns to be addressed:  Care Coordination Readmission within the last 30 days:  Yes Current discharge risk:  None Barriers to Discharge:  No Barriers Identified   Shela Leff, LCSW 11/14/2018, 10:56 AM

## 2018-11-15 ENCOUNTER — Inpatient Hospital Stay: Payer: Self-pay

## 2018-11-15 DIAGNOSIS — R531 Weakness: Secondary | ICD-10-CM

## 2018-11-15 LAB — BASIC METABOLIC PANEL
Anion gap: 13 (ref 5–15)
BUN: 29 mg/dL — ABNORMAL HIGH (ref 8–23)
CALCIUM: 9.5 mg/dL (ref 8.9–10.3)
CHLORIDE: 104 mmol/L (ref 98–111)
CO2: 24 mmol/L (ref 22–32)
CREATININE: 2.45 mg/dL — AB (ref 0.44–1.00)
GFR calc Af Amer: 24 mL/min — ABNORMAL LOW (ref >60)
GFR calc non Af Amer: 20 mL/min — ABNORMAL LOW (ref >60)
GLUCOSE: 108 mg/dL — AB (ref 70–99)
Potassium: 3.3 mmol/L — ABNORMAL LOW (ref 3.5–5.1)
Sodium: 141 mmol/L (ref 135–145)

## 2018-11-15 LAB — MAGNESIUM: Magnesium: 2.1 mg/dL (ref 1.7–2.4)

## 2018-11-15 MED ORDER — POTASSIUM CHLORIDE CRYS ER 20 MEQ PO TBCR
40.0000 meq | EXTENDED_RELEASE_TABLET | Freq: Once | ORAL | Status: AC
  Administered 2018-11-15: 40 meq via ORAL
  Filled 2018-11-15: qty 2

## 2018-11-15 MED ORDER — VITAMIN C 500 MG PO TABS
250.0000 mg | ORAL_TABLET | Freq: Two times a day (BID) | ORAL | Status: DC
  Administered 2018-11-15 – 2018-11-20 (×11): 250 mg via ORAL
  Filled 2018-11-15 (×11): qty 1

## 2018-11-15 MED ORDER — METOPROLOL TARTRATE 25 MG PO TABS
25.0000 mg | ORAL_TABLET | Freq: Two times a day (BID) | ORAL | Status: DC
  Administered 2018-11-15 – 2018-11-19 (×6): 25 mg via ORAL
  Filled 2018-11-15 (×10): qty 1

## 2018-11-15 MED ORDER — METOPROLOL TARTRATE 25 MG PO TABS: 25.0000 mg | ORAL_TABLET | Freq: Two times a day (BID) | ORAL | Status: DC

## 2018-11-15 NOTE — Care Management (Signed)
RNCM assessment completed Full note to follow  

## 2018-11-15 NOTE — Progress Notes (Signed)
PT Cancellation Note  Patient Details Name: [name] MRN: [id number] DOB: 08/09/1956   Cancelled Treatment:    Reason Eval/Treat Not Completed: Other (comment). Leaving for [place] so will try again at another time.   Ramond Dial 11/15/2018, 2:20 PM   Mee Hives, PT MS Acute Rehab Dept. Number: [place]

## 2018-11-15 NOTE — Progress Notes (Signed)
Vonda Antigua, MD [address] [place], [place], [place], [address] [address], [place], [place], [place], [address] Phone: [phone]  Fax: [phone]   Subjective:  Patient tolerating solid food diet.  Oriented to person, place and situation.  Appears much improved today.  Objective: Exam: Vital signs in last 24 hours: Vitals:   11/14/18 1952 11/15/18 0443 11/15/18 0455 11/15/18 0554  BP: 99/71 (!) 81/56 (!) 82/61 96/66  Pulse: 85 88 89 86  Resp: 18 16 16    Temp: 98 F (36.7 C) 98.4 F (36.9 C) 98.4 F (36.9 C)   TempSrc: Oral Oral Oral   SpO2: 98% 98% 98%   Weight:      Height:       Weight change:   Intake/Output Summary (Last 24 hours) at 11/15/2018 1126 Last data filed at 11/15/2018 0455 Gross per 24 hour  Intake 1003.17 ml  Output 450 ml  Net 553.17 ml    General: No acute distress, AAO x3 Abd: Soft, nontender, distended, No HSM Skin: Warm, no rashes Neck: Supple, Trachea midline   Lab Results: Lab Results  Component Value Date   WBC 4.1 11/13/2018   HGB 9.5 (L) 11/13/2018   HCT 28.2 (L) 11/13/2018   MCV 98.3 11/13/2018   PLT 233 11/13/2018   Micro Results: Recent Results (from the past 240 hour(s))  Body fluid culture     Status: None   Collection Time: 11/10/18 12:19 PM  Result Value Ref Range Status   Specimen Description   Final    PERITONEAL Performed at [facility], [address], [place], [place] [address]    Special Requests   Final    NONE Performed at [facility], [address] [place], [place] [address]    Gram Stain   Final    WBC PRESENT,BOTH PMN AND MONONUCLEAR NO ORGANISMS SEEN CYTOSPIN SMEAR    Culture   Final    NO GROWTH 3 DAYS Performed at [facility], [place] [address], [place], [place] [address]    Report Status 11/13/2018 FINAL  Final  Fungus Culture With Stain     Status: None (Preliminary result)   Collection Time: 11/10/18 12:19 PM  Result Value Ref Range Status    Fungus Stain Final report  Final    Comment: (NOTE) Performed At: [facility] [place], [place] [id number] Rush Farmer MD YI:[id number]    Fungus (Mycology) Culture PENDING  Incomplete   Fungal Source PERITONEAL  Final    Comment: Performed at [facility], [address] [place], [place] [address]  Fungus Culture Result     Status: None   Collection Time: 11/10/18 12:19 PM  Result Value Ref Range Status   Result 1 Comment  Final    Comment: (NOTE) KOH/Calcofluor preparation:  no fungus observed. Performed At: [facility] [place], [place] [id number] Rush Farmer MD IR:[id number]   Urine Culture     Status: Abnormal   Collection Time: 11/10/18  4:28 PM  Result Value Ref Range Status   Specimen Description   Final    URINE, RANDOM Performed at [facility], [address] [place], [place] [address]    Special Requests   Final    NONE Performed at [facility], [place], [place] [address]    Culture 10,000 COLONIES/mL YEAST (A)  Final   Report Status 11/12/2018 FINAL  Final   Studies/Results: No results found. Medications:  Scheduled Meds: . diltiazem  30 mg Oral Q8H  . feeding supplement (ENSURE ENLIVE)  237 mL Oral BID BM  . folic acid  1 mg Oral Daily  . lactulose  30 g Oral TID  . midodrine  5 mg Oral TID WC  . multivitamin with minerals  1 tablet Oral Daily  . nicotine  21 mg Transdermal Daily  . octreotide  100 mcg Subcutaneous TID  . rifaximin  550 mg Oral BID  . spironolactone  25 mg Oral Daily  . venlafaxine  37.5 mg Oral BID WC  . vitamin C  250 mg Oral BID   Continuous Infusions: . sodium chloride 250 mL (11/15/18 0303)  . albumin human 12.5 g (11/15/18 0123)  . thiamine 500mg  in normal saline (32ml) IVPB 200 mg (11/15/18 1003)   PRN Meds:.sodium chloride, acetaminophen **OR** acetaminophen, guaiFENesin-dextromethorphan, metoprolol tartrate, ondansetron  **OR** ondansetron (ZOFRAN) IV, senna-docusate   Assessment: Cirrhosis, ascites, encephalopathy  Plan: Rifaximin started by primary team yesterday Lactulose on board Continue to monitor mental status, which is improving. Avoid diarrhea with lactulose  Sodium restriction to less than 2 g a day Paracentesis has been ordered by primary team due to abdominal distention which is appropriate.  Check fluid studies to rule out SBP.  Hyponatremia resolved Continue electrolyte replacement as needed.  Hyperkalemia is improved today but potassium still low at 3.3  Dr. Alice Reichert is on service and patient was signed out to him.   LOS: 6 days   Vonda Antigua, MD 11/15/2018, 11:26 AM

## 2018-11-15 NOTE — Progress Notes (Signed)
[facility]  ROUNDING NOTE   Subjective:   Able to follow few basic commands and answer simple questions.  Appetite is poor but trying to eat a few bites Scheduled for paracentesis today   Objective:  Vital signs in last 24 hours:  Temp:  [97.9 F (36.6 C)-98.4 F (36.9 C)] 98.4 F (36.9 C) (11/27 0455) Pulse Rate:  [85-92] 86 (11/27 0554) Resp:  [14-18] 16 (11/27 0455) BP: (81-99)/(56-71) 96/66 (11/27 0554) SpO2:  [98 %-100 %] 98 % (11/27 0455)  Weight change:  Filed Weights   11/09/18 1321  Weight: 65.3 kg    Intake/Output: I/O last 3 completed shifts: In: 1117.5 [P.O.:300; I.V.:7; IV Piggyback:810.5] Out: 730 [Urine:580; Emesis/NG output:150]   Intake/Output this shift:  No intake/output data recorded.  Physical Exam: General: NAD,   Head/ENT Normocephalic, atraumatic. Moist oral mucosal membranes  Neck: Supple, trachea midline  Lungs:  Clear to auscultation  Heart: Regular rate and rhythm  Abdomen:  + ascites  Extremities:  trace peripheral edema.  Neurologic: Nonfocal, moving all four extremities, alert, oriented to self only        Basic Metabolic Panel: Recent Labs  Lab 11/11/18 0531 11/12/18 0435 11/13/18 0321 11/14/18 0526 11/15/18 0500  NA 131* 134* 138 140 141  K 3.4* 3.1* 2.7* 2.8* 3.3*  CL 101 104 104 103 104  CO2 17* 20* 25 24 24   GLUCOSE 64* 121* 133* 116* 108*  BUN 37* 35* 34* 30* 29*  CREATININE 2.60* 2.58* 2.38* 2.54* 2.45*  CALCIUM 8.1* 8.4* 8.7* 9.4 9.5  MG  --   --   --  1.6* 2.1  PHOS  --   --   --  2.7  --     Liver Function Tests: Recent Labs  Lab 11/09/18 1318 11/12/18 0435 11/13/18 0321  AST 48* 27 26  ALT 24 11 11   ALKPHOS 115 53 44  BILITOT 1.2 1.3* 1.1  PROT 5.8* 4.7* 4.9*  ALBUMIN 2.0* 3.0* 3.3*   No results for input(s): LIPASE, AMYLASE in the last 168 hours. Recent Labs  Lab 11/09/18 1318  AMMONIA 21    CBC: Recent Labs  Lab 11/09/18 1318 11/10/18 0324 11/13/18 0321  WBC 9.6  12.5* 4.1  NEUTROABS 7.2  --   --   HGB 13.2 11.9* 9.5*  HCT 38.8 36.0 28.2*  MCV 97.5 97.8 98.3  PLT 330 324 233    Cardiac Enzymes: Recent Labs  Lab 11/09/18 1318  TROPONINI <0.03    BNP: Invalid input(s): POCBNP  CBG: No results for input(s): GLUCAP in the last 168 hours.  Microbiology: Results for orders placed or performed during the hospital encounter of 11/09/18  Body fluid culture     Status: None   Collection Time: 11/10/18 12:19 PM  Result Value Ref Range Status   Specimen Description   Final    PERITONEAL Performed at [facility], [address] [place], [place] [address]    Special Requests   Final    NONE Performed at [facility], [address] [place], [place] [address]    Gram Stain   Final    WBC PRESENT,BOTH PMN AND MONONUCLEAR NO ORGANISMS SEEN CYTOSPIN SMEAR    Culture   Final    NO GROWTH 3 DAYS Performed at [facility], [place] [address], [place], [place] [address]    Report Status 11/13/2018 FINAL  Final  Fungus Culture With Stain     Status: None (Preliminary result)  Collection Time: 11/10/18 12:19 PM  Result Value Ref Range Status   Fungus Stain Final report  Final    Comment: (NOTE) Performed At: [facility] [place], [place] [id number] Rush Farmer MD [id number]    Fungus (Mycology) Culture PENDING  Incomplete   Fungal Source PERITONEAL  Final    Comment: Performed at [facility], [address] [place], [place] [address]  Fungus Culture Result     Status: None   Collection Time: 11/10/18 12:19 PM  Result Value Ref Range Status   Result 1 Comment  Final    Comment: (NOTE) KOH/Calcofluor preparation:  no fungus observed. Performed At: [facility] [place], [place] [id number] Rush Farmer MD [id number]   Urine Culture     Status: Abnormal   Collection Time: 11/10/18  4:28 PM  Result Value Ref Range Status   Specimen Description    Final    URINE, RANDOM Performed at [facility], [address] [place], [place] [address]    Special Requests   Final    NONE Performed at [facility], [place],  [address]    Culture 10,000 COLONIES/mL YEAST (A)  Final   Report Status 11/12/2018 FINAL  Final    Coagulation Studies: No results for input(s): LABPROT, INR in the last 72 hours.  Urinalysis: No results for input(s): COLORURINE, LABSPEC, PHURINE, GLUCOSEU, HGBUR, BILIRUBINUR, KETONESUR, PROTEINUR, UROBILINOGEN, NITRITE, LEUKOCYTESUR in the last 72 hours.  Invalid input(s): APPERANCEUR    Imaging: No results found.   Medications:   . sodium chloride 250 mL (11/15/18 0303)  . albumin human 12.5 g (11/15/18 0123)  . thiamine 500mg  in normal saline (4ml) IVPB 200 mg (11/15/18 1003)   . diltiazem  30 mg Oral Q8H  . feeding supplement (ENSURE ENLIVE)  237 mL Oral BID BM  . folic acid  1 mg Oral Daily  . lactulose  30 g Oral TID  . midodrine  5 mg Oral TID WC  . multivitamin with minerals  1 tablet Oral Daily  . nicotine  21 mg Transdermal Daily  . octreotide  100 mcg Subcutaneous TID  . rifaximin  550 mg Oral BID  . spironolactone  25 mg Oral Daily  . venlafaxine  37.5 mg Oral BID WC  . vitamin C  250 mg Oral BID   sodium chloride, acetaminophen **OR** acetaminophen, guaiFENesin-dextromethorphan, metoprolol tartrate, ondansetron **OR** ondansetron (ZOFRAN) IV, senna-docusate  Assessment/ Plan:  [name] is a 62 y.o. white female [name] is a 62 y.o. white female with hepatic cirrhosis, atrial fibrillation, EtOH, depression, who was admitted to [facility] on 11/09/2018 for Hyponatremia and acute renal failure  1. Hyponatremia: secondary to hepatic cirrhosis.  - Avoid IV fluids as ascites is reaccumulating.  - Na corrected to 141  2. Acute Renal Failure:  Secondary to hemodynamic changes Baseline creatinine of 0.88 on 10/29/18  - Contonue albumin,  reduce dose to 12.5 gm BID - Continue midodrine - Continue octreotide.   3. Hypokalemia -Continue spironolactone - may supplement orally or iv  4. Edema, cirrhosis and ascites, hepatic encephalopathy status post large volume paracentesis on 11/22  5 liters removed.  Management as per GI/internal medicine teams     LOS: 6 Dagmawi Venable 11/27/201911:50 AM

## 2018-11-15 NOTE — Progress Notes (Signed)
[place] [facility] [place] NAME: Viriginia [name]    MR#:  [id number]  DATE OF BIRTH:  [birth year]  SUBJECTIVE:  CHIEF COMPLAINT:   Chief Complaint  Patient presents with  . Altered Mental Status  Patient seen today Responds to verbal commands Has somewhat improved appetite Has periods of confusion Abdominal distention secondary to ascites No fever   REVIEW OF SYSTEMS:    ROS Review of Systems  Constitutional: Generalized weakness HENT: Negative.   Eyes: Negative.   Respiratory: Negative.   Cardiovascular: Negative.   Gastrointestinal: Has abdominal distention Genitourinary: Negative.   Musculoskeletal: Negative.   Skin: Negative.   Neurological: Not completely oriented to time place and person Endo/Heme/Allergies: Negative.   Psychiatric/Behavioral: periods of confusion All other systems reviewed and are negative.  DRUG ALLERGIES:  No Known Allergies  VITALS:  Blood pressure 97/60, pulse 87, temperature 98.3 F (36.8 C), temperature source Oral, resp. rate 16, height 5\' 9"  (1.753 m), weight 65.3 kg, SpO2 99 %.  PHYSICAL EXAMINATION:   Physical Exam  GENERAL:  62 y.o.-year-old patient lying in the bed  EYES: Pupils equal, round, reactive to light and accommodation. No scleral icterus. Extraocular muscles intact.  HEENT: Head atraumatic, normocephalic. Oropharynx and nasopharynx clear.  NECK:  Supple, no jugular venous distention. No thyroid enlargement, no tenderness.  LUNGS: Normal breath sounds bilaterally, no wheezing, rales, rhonchi. No use of accessory muscles of respiration.  CARDIOVASCULAR: S1, S2 normal. No murmurs, rubs, or gallops.  ABDOMEN: Soft, nontender, has distension and ascites. Bowel sounds present. No organomegaly or mass.  EXTREMITIES: No cyanosis, clubbing  1 plus edema b/l.    NEUROLOGIC: Cranial nerves II through XII are intact. No focal Motor or sensory deficits b/l.   PSYCHIATRIC: Oriented to self and  place SKIN: No obvious rash, lesion, or ulcer.   LABORATORY PANEL:   CBC Recent Labs  Lab 11/13/18 0321  WBC 4.1  HGB 9.5*  HCT 28.2*  PLT 233   ------------------------------------------------------------------------------------------------------------------ Chemistries  Recent Labs  Lab 11/13/18 0321  11/15/18 0500  NA 138   < > 141  K 2.7*   < > 3.3*  CL 104   < > 104  CO2 25   < > 24  GLUCOSE 133*   < > 108*  BUN 34*   < > 29*  CREATININE 2.38*   < > 2.45*  CALCIUM 8.7*   < > 9.5  MG  --    < > 2.1  AST 26  --   --   ALT 11  --   --   ALKPHOS 44  --   --   BILITOT 1.1  --   --    < > = values in this interval not displayed.   ------------------------------------------------------------------------------------------------------------------  Cardiac Enzymes Recent Labs  Lab 11/09/18 1318  TROPONINI <0.03   ------------------------------------------------------------------------------------------------------------------  RADIOLOGY:  No results found.   ASSESSMENT AND PLAN:  62 year old female patient with history of tobacco abuse, alcohol abuse, liver cirrhosis, atrial fibrillation currently under hospitalist service for altered mental status and abdominal distention  -Decompensated liver cirrhosis with ascites Ultrasound-guided paracentesis again today Status post GI evaluation and follow-up Continue albumin, octreotide and midodrine  -Acute encephalopathy  Possibly Wernicke's from chronic alcohol use Thiamine supplementation intravenously Multifactorial in etiology Continue oral lactulose to avoid hepatic encephalopathy Completed IV antibiotics  -Hyponatremia improved Appreciate nephrology follow-up Sodium tablets deferred to avoid worsening of ascites  -Metabolic acidosis improved off IV sodium  bicarbonate fluids  -Acute renal failure Creatinine remaining stable Probability of hepatorenal syndrome Continue oral midodrine Continue  octreotide Continue albumin  -Acute hypokalemia Replace potassium aggressively  -Acute hypomagnesemia resolved  -Chronic atrial fibrillation Discontinue oral Cardizem in view of poor ejection fraction Rate controlled with oral metoprolol Not a candidate for anticoagulation  -Tobacco abuse On nicotine patch  -DVT prophylaxis with sequential compression device to lower extremities  -Overall long term prognosis poor Family aware of medical condition and treatment plan and overall poor prognosis Palliative medicine consult  -Ambulatory dysfunction physical deconditioning Physical therapy evaluation for strengthening Will need SNF   All the records are reviewed and case discussed with Care Management/Social Worker. Management plans discussed with the patient, family and they are in agreement.  CODE STATUS: Full code  DVT Prophylaxis: SCDs  TOTAL TIME TAKING CARE OF THIS PATIENT: 34 minutes.   POSSIBLE D/C IN 2 to 3 DAYS, DEPENDING ON CLINICAL CONDITION.  [name] M.D on 11/15/2018 at 1:02 PM  Between 7am to 6pm - Pager - [phone]  After 6pm go to [email or web address] - password [email or web address] [facility]  Office  [phone]  CC: Primary care physician; [place]  Note: This dictation was prepared with Dragon dictation along with smaller Company secretary. Any transcriptional errors that result from this process are unintentional.

## 2018-11-15 NOTE — Progress Notes (Signed)
Patient ID: [name], female   DOB: 08-May-1956, 62 y.o.   MRN: [id number]  This NP visited patient at the bedside as a follow up to  yesterday's [place].    Patient is more alert today however she is oriented only to person.  She has no insight into her current medical situation and actually during our conversation she was hallucinating that family was in the room.  I placed a call to her husband today and continued conversation regarding current medical situation; diagnosis, prognosis, treatment options, advanced directive decisions and anticipatory care needs.   Stressed the importance of him moving forward inquiring with DSS the possibility of getting Medicaid for his wife.  Apparently she has no insurance, they live at [facility].  Has been is looking into the possibility of having some out-of-pocket caregivers help him with her care at home when she is discharged.  I offered information on hospice benefit future needs.  Recommend community-based palliative services on discharge.  Again stressed the importance of consideration of advanced directives specific to life prolonging measures in the patient with terminal illness.  Questions and concerns addressed   Discussed with Dr Estanislado Pandy  Total time spent on the unit was 25 minutes  Greater than 50% of the time was spent in counseling and coordination of care  Wadie Lessen NP  Palliative Medicine Team Team Phone # [phone] Pager [phone]

## 2018-11-16 LAB — BASIC METABOLIC PANEL
ANION GAP: 8 (ref 5–15)
BUN: 27 mg/dL — ABNORMAL HIGH (ref 8–23)
CALCIUM: 8.8 mg/dL — AB (ref 8.9–10.3)
CHLORIDE: 106 mmol/L (ref 98–111)
CO2: 23 mmol/L (ref 22–32)
Creatinine, Ser: 2.32 mg/dL — ABNORMAL HIGH (ref 0.44–1.00)
GFR calc non Af Amer: 22 mL/min — ABNORMAL LOW (ref >60)
GFR, EST AFRICAN AMERICAN: 25 mL/min — AB (ref >60)
Glucose, Bld: 105 mg/dL — ABNORMAL HIGH (ref 70–99)
Potassium: 3.5 mmol/L (ref 3.5–5.1)
Sodium: 137 mmol/L (ref 135–145)

## 2018-11-16 LAB — LIPASE, FLUID: LIPASE-FLUID: 6 U/L

## 2018-11-16 LAB — GLUCOSE, CAPILLARY: Glucose-Capillary: 103 mg/dL — ABNORMAL HIGH (ref 70–99)

## 2018-11-16 MED ORDER — POTASSIUM CHLORIDE CRYS ER 20 MEQ PO TBCR
40.0000 meq | EXTENDED_RELEASE_TABLET | Freq: Once | ORAL | Status: AC
  Administered 2018-11-16: 40 meq via ORAL
  Filled 2018-11-16: qty 2

## 2018-11-16 NOTE — Progress Notes (Signed)
[facility] NAME: [name]    MR#:  [id number]  DATE OF BIRTH:  [birth year]  SUBJECTIVE:  CHIEF COMPLAINT:   Chief Complaint  Patient presents with  . Altered Mental Status  Patient seen today Responds to verbal commands Appetite not great Has periods of confusion on and off Abdominal distention secondary to ascites better after paracentesis No fever Had paracentesis yesterday and 5 L were removed   REVIEW OF SYSTEMS:    ROS Review of Systems  Constitutional: Generalized weakness HENT: Negative.   Eyes: Negative.   Respiratory: Negative.   Cardiovascular: Negative.   Gastrointestinal: Has decreased abdominal distention Genitourinary: Negative.   Musculoskeletal: Negative.   Skin: Negative.   Neurological: Not completely oriented to time place and person Endo/Heme/Allergies: Negative.   Psychiatric/Behavioral: periods of confusion All other systems reviewed and are negative.  DRUG ALLERGIES:  No Known Allergies  VITALS:  Blood pressure 90/65, pulse 86, temperature 98.2 F (36.8 C), temperature source Oral, resp. rate 18, height 5\' 9"  (1.753 m), weight 65.3 kg, SpO2 100 %.  PHYSICAL EXAMINATION:   Physical Exam  GENERAL:  62 y.o.-year-old patient lying in the bed  EYES: Pupils equal, round, reactive to light and accommodation. No scleral icterus. Extraocular muscles intact.  HEENT: Head atraumatic, normocephalic. Oropharynx and nasopharynx clear.  NECK:  Supple, no jugular venous distention. No thyroid enlargement, no tenderness.  LUNGS: Normal breath sounds bilaterally, no wheezing, rales, rhonchi. No use of accessory muscles of respiration.  CARDIOVASCULAR: S1, S2 normal. No murmurs, rubs, or gallops.  ABDOMEN: Soft, nontender, has decreased distention. Bowel sounds present. No organomegaly or mass.  EXTREMITIES: No cyanosis, clubbing  1 plus edema b/l.    NEUROLOGIC: Cranial nerves II through XII are  intact. No focal Motor or sensory deficits b/l.   PSYCHIATRIC: Oriented to self and place SKIN: No obvious rash, lesion, or ulcer.   LABORATORY PANEL:   CBC Recent Labs  Lab 11/13/18 0321  WBC 4.1  HGB 9.5*  HCT 28.2*  PLT 233   ------------------------------------------------------------------------------------------------------------------ Chemistries  Recent Labs  Lab 11/13/18 0321  11/15/18 0500 11/16/18 0527  NA 138   < > 141 137  K 2.7*   < > 3.3* 3.5  CL 104   < > 104 106  CO2 25   < > 24 23  GLUCOSE 133*   < > 108* 105*  BUN 34*   < > 29* 27*  CREATININE 2.38*   < > 2.45* 2.32*  CALCIUM 8.7*   < > 9.5 8.8*  MG  --    < > 2.1  --   AST 26  --   --   --   ALT 11  --   --   --   ALKPHOS 44  --   --   --   BILITOT 1.1  --   --   --    < > = values in this interval not displayed.   ------------------------------------------------------------------------------------------------------------------  Cardiac Enzymes Recent Labs  Lab 11/09/18 1318  TROPONINI <0.03   ------------------------------------------------------------------------------------------------------------------  RADIOLOGY:  US Paracentesis  Result Date: 11/15/2018 INDICATION: Ascites EXAM: ULTRASOUND GUIDED  PARACENTESIS MEDICATIONS: None. COMPLICATIONS: None immediate. PROCEDURE: Informed written consent was obtained from the patient after a discussion of the risks, benefits and alternatives to treatment. A timeout was performed prior to the initiation of the procedure. Initial ultrasound scanning demonstrates a large amount of ascites within the left lower abdominal quadrant.  The left lower abdomen was prepped and draped in the usual sterile fashion. 1% lidocaine with epinephrine was used for local anesthesia. Following this, a 6 Fr Safe-T-Centesis catheter was introduced. An ultrasound image was saved for documentation purposes. The paracentesis was performed. The catheter was removed and a  dressing was applied. The patient tolerated the procedure well without immediate post procedural complication. FINDINGS: A total of approximately 5 L of clear yellow fluid was removed. IMPRESSION: Successful ultrasound-guided paracentesis yielding 5 liters of peritoneal fluid. Electronically Signed   By: Marybelle Killings M.D.   On: 11/15/2018 16:15     ASSESSMENT AND PLAN:  62 year old female patient with history of tobacco abuse, alcohol abuse, liver cirrhosis, atrial fibrillation currently under hospitalist service for altered mental status and abdominal distention  -Decompensated liver cirrhosis with worsening ascites Ultrasound-guided paracentesis done yesterday and 5 L of fluid were removed Status post GI evaluation and follow-up Continue albumin, octreotide and midodrine for now  -Acute encephalopathy improving very slowly Possibly Wernicke's from chronic alcohol use Thiamine supplementation intravenously continue Multifactorial in etiology Continue oral lactulose to avoid hepatic encephalopathy Completed IV antibiotics therapy  -Hyponatremia improved Appreciate nephrology follow-up Sodium tablets deferred to avoid worsening of ascites  -Metabolic acidosis improved off IV sodium bicarbonate fluids  -Acute renal failure Creatinine remaining stable Probability of hepatorenal syndrome Continue oral midodrine Continue octreotide Continue albumin No new intervention  -Acute hypokalemia Replace potassium aggressively  -Acute hypomagnesemia resolved  -Chronic atrial fibrillation Discontinue oral Cardizem in view of poor ejection fraction Rate controlled with oral metoprolol Not a candidate for anticoagulation  -Tobacco abuse On nicotine patch  -DVT prophylaxis with sequential compression device to lower extremities  -Overall long term prognosis poor Family aware of medical condition and treatment plan and overall poor prognosis Palliative medicine consult  appreciated  -Ambulatory dysfunction physical deconditioning Physical therapy evaluation for strengthening if patient able to perform Will need SNF when patient gets clinically stable and mental status improves   All the records are reviewed and case discussed with Care Management/Social Worker. Management plans discussed with the patient, family and they are in agreement.  CODE STATUS: Full code  DVT Prophylaxis: SCDs  TOTAL TIME TAKING CARE OF THIS PATIENT: 33 minutes.   POSSIBLE D/C IN 2 to 3 DAYS, DEPENDING ON CLINICAL CONDITION.  [name] M.D on 11/16/2018 at 11:41 AM  Between 7am to 6pm - Pager - [phone]  After 6pm go to [email or web address] - password [email or web address] [facility]  Office  [phone]  CC: Primary care physician; Alwyn Pea, NP  Note: This dictation was prepared with Dragon dictation along with smaller phrase technology. Any transcriptional errors that result from this process are unintentional.

## 2018-11-16 NOTE — Progress Notes (Signed)
[facility] Gastroenterology Inpatient Progress Note  Subjective: Patient seen for follow-up encephalopathy, hyponatremia, hypotension, diuretic resistant ascites.  Patient still with unresolving acute renal failure, though nonoliguric.  Patient on oral midodrine, IV albumin, octreotide.  Status post paracentesis a couple days ago showing low protein, high SAAG ascites, fluid not infected. Patient unwilling to eat her lunch today, acting somewhat agitated.  Alert but not aware of time and place. CNA is at bedside during examination.   Objective: Vital signs in last 24 hours: Temp:  [97.9 F (36.6 C)-98.2 F (36.8 C)] 97.9 F (36.6 C) (11/28 1406) Pulse Rate:  [78-86] 79 (11/28 1406) Resp:  [16-18] 16 (11/28 1406) BP: (90-117)/(54-82) 117/82 (11/28 1406) SpO2:  [97 %-100 %] 100 % (11/28 1406) Blood pressure 117/82, pulse 79, temperature 97.9 F (36.6 C), temperature source Oral, resp. rate 16, height 5\' 9"  (1.753 m), weight 65.3 kg, SpO2 100 %.    Intake/Output from previous day: 11/27 0701 - 11/28 0700 In: 0  Out: 150 [Urine:150]  Intake/Output this shift: Total I/O In: 1775.3 [P.O.:600; I.V.:216.1; IV Piggyback:959.1] Out: 150 [Urine:150]   General appearance:  Alert, confused. Resp:  CTA Cardio: Slight tachycardia, no gallop. GI:  Distended, no rebound tenderness. BS+ Extremities:  No edema.    Lab Results: Results for orders placed or performed during the hospital encounter of 11/09/18 (from the past 24 hour(s))  Basic metabolic panel     Status: Abnormal   Collection Time: 11/16/18  5:27 AM  Result Value Ref Range   Sodium 137 135 - 145 mmol/L   Potassium 3.5 3.5 - 5.1 mmol/L   Chloride 106 98 - 111 mmol/L   CO2 23 22 - 32 mmol/L   Glucose, Bld 105 (H) 70 - 99 mg/dL   BUN 27 (H) 8 - 23 mg/dL   Creatinine, Ser 2.32 (H) 0.44 - 1.00 mg/dL   Calcium 8.8 (L) 8.9 - 10.3 mg/dL   GFR calc non Af Amer 22 (L) >60 mL/min   GFR calc Af Amer 25 (L) >60 mL/min   Anion  gap 8 5 - 15  Glucose, capillary     Status: Abnormal   Collection Time: 11/16/18  7:49 AM  Result Value Ref Range   Glucose-Capillary 103 (H) 70 - 99 mg/dL   Comment 1 Notify RN      No results for input(s): WBC, HGB, HCT, PLT in the last 72 hours. BMET Recent Labs    11/14/18 0526 11/15/18 0500 11/16/18 0527  NA 140 141 137  K 2.8* 3.3* 3.5  CL 103 104 106  CO2 24 24 23   GLUCOSE 116* 108* 105*  BUN 30* 29* 27*  CREATININE 2.54* 2.45* 2.32*  CALCIUM 9.4 9.5 8.8*   LFT No results for input(s): PROT, ALBUMIN, AST, ALT, ALKPHOS, BILITOT, BILIDIR, IBILI in the last 72 hours. PT/INR No results for input(s): LABPROT, INR in the last 72 hours. Hepatitis Panel No results for input(s): HEPBSAG, HCVAB, HEPAIGM, HEPBIGM in the last 72 hours. C-Diff No results for input(s): CDIFFTOX in the last 72 hours. No results for input(s): CDIFFPCR in the last 72 hours.   Studies/Results: US Paracentesis  Result Date: 11/15/2018 INDICATION: Ascites EXAM: ULTRASOUND GUIDED  PARACENTESIS MEDICATIONS: None. COMPLICATIONS: None immediate. PROCEDURE: Informed written consent was obtained from the patient after a discussion of the risks, benefits and alternatives to treatment. A timeout was performed prior to the initiation of the procedure. Initial ultrasound scanning demonstrates a large amount of ascites within the left lower  abdominal quadrant. The left lower abdomen was prepped and draped in the usual sterile fashion. 1% lidocaine with epinephrine was used for local anesthesia. Following this, a 6 Fr Safe-T-Centesis catheter was introduced. An ultrasound image was saved for documentation purposes. The paracentesis was performed. The catheter was removed and a dressing was applied. The patient tolerated the procedure well without immediate post procedural complication. FINDINGS: A total of approximately 5 L of clear yellow fluid was removed. IMPRESSION: Successful ultrasound-guided paracentesis  yielding 5 liters of peritoneal fluid. Electronically Signed   By: Marybelle Killings M.D.   On: 11/15/2018 16:15    Scheduled Inpatient Medications:   . feeding supplement (ENSURE ENLIVE)  237 mL Oral BID BM  . folic acid  1 mg Oral Daily  . lactulose  30 g Oral TID  . metoprolol tartrate  25 mg Oral BID  . midodrine  5 mg Oral TID WC  . multivitamin with minerals  1 tablet Oral Daily  . nicotine  21 mg Transdermal Daily  . octreotide  100 mcg Subcutaneous TID  . rifaximin  550 mg Oral BID  . spironolactone  25 mg Oral Daily  . venlafaxine  37.5 mg Oral BID WC  . vitamin C  250 mg Oral BID    Continuous Inpatient Infusions:   . sodium chloride Stopped (11/15/18 1607)  . albumin human 12.5 g (11/16/18 1106)  . thiamine 500mg  in normal saline (36ml) IVPB 200 mg (11/16/18 0944)    PRN Inpatient Medications:  sodium chloride, acetaminophen **OR** acetaminophen, guaiFENesin-dextromethorphan, metoprolol tartrate, ondansetron **OR** ondansetron (ZOFRAN) IV, senna-docusate  Miscellaneous: N/A  Assessment:   1. Diuretic-resistant ascites - On LVP serially, Midodrine and octreotide 2. ARF - On IV albumin. 3. 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 4. Hyponatremia - Resolved currently.  5. Hypotension - Stable.  Plan:  1. Continue Na restriction, Midodrine. 2. Serial LVP at 5 liters or less if possible.  3. Following along.    K. Alice Reichert, M.D. 11/16/2018, 2:14 PM

## 2018-11-16 NOTE — Progress Notes (Addendum)
Patient has increased edema to LUE with some redness in elbow area. Nurse asked patient if arm hurt and patient stated " its not bad". Provider notified. Will continue to monitor and follow up with provider. Arm elevated on pillows.

## 2018-11-17 ENCOUNTER — Inpatient Hospital Stay: Payer: Self-pay

## 2018-11-17 MED ORDER — VITAMIN B-1 100 MG PO TABS
100.0000 mg | ORAL_TABLET | Freq: Every day | ORAL | Status: DC
  Administered 2018-11-17 – 2018-11-20 (×4): 100 mg via ORAL
  Filled 2018-11-17 (×4): qty 1

## 2018-11-17 NOTE — Progress Notes (Signed)
Physical Therapy Treatment Patient Details Name: [name] MRN: [id number] DOB: 1956-05-06 Today's Date: 11/17/2018    History of Present Illness 62 yo female with onset of AMS had 5L fluid removed in paracentesis on 11/22, now referred to PT.  Her husband is not present for eval but did call hosp during the PT visit to ask for a number, pt could not tell it.  Pt admitted for general weakness, at baseline was wearing paper undergarments and not clear if she was ambulatory.  PMHx:  recent admission for sepsis UTI, skin CA, cirrhosis, depression, EtOH abuse, tobacco abuse,     PT Comments    Patient nodded in agreement when asked if she would like to try to do some exercises and transfer to chair today.  PT assisted RN and NA in finishing pt's bed bath and donning gown as pt requires max to total assist for bed mobility intermittently.  Pt appears to be inconsistent cognitively and was unaware that she was in the hospital.  Pt required Max +2 to get to EOB, assisting only with anteriorly leaning posture once cued to avoid falling back onto the bed.  She was able to sit upright with close supervision.  Pt attempted squat pivot transfer to chair with assistance from NA and PT.  She appeared to initiate the transfer, leaning forward when PT cued her to and then became total assist halfway through transfer.  PT required +3 assist to get into chair and a lift is recommended for pt transfers until cognitive state has returned to closer to baseline.  Pt will continue to benefit from skilled PT, pending active participation, to focus on strength and safe functional mobility.    Follow Up Recommendations  SNF     Equipment Recommendations  Rolling walker with 5" wheels    Recommendations for Other Services       Precautions / Restrictions Precautions Precautions: Fall Other Brace: soft boots to protect heels Restrictions Weight Bearing Restrictions: No    Mobility  Bed Mobility Overal  bed mobility: Needs Assistance Bed Mobility: Rolling;Supine to Sit Rolling: Max assist;+2 for physical assistance   Supine to sit: Max assist;+2 for physical assistance     General bed mobility comments: Patient will take PT's hand to roll side to side but does not participate consistently.  Pt able to assist with supine to sit but requires frequent VC's to "lean forward and to the right" as pt continues to try to return to bed.    Transfers Overall transfer level: Needs assistance   Transfers: Squat Pivot Transfers     Squat pivot transfers: +2 physical assistance;Total assist     General transfer comment: Patient cued to lean forward, plant feet and stand up during transfer.  She initially leaned forward and then became "dead weight", sliding her knees anteriorly, requiring 3 people to assist her to chair.  Ambulation/Gait                 Stairs             Wheelchair Mobility    Modified Rankin (Stroke Patients Only)       Balance Overall balance assessment: Needs assistance;History of Falls Sitting-balance support: Feet supported;Bilateral upper extremity supported Sitting balance-Leahy Scale: Fair Sitting balance - Comments: Able to sit upright but requires several VC's to return to upright posture. Postural control: Posterior lean;Left lateral lean   Standing balance-Leahy Scale: Zero  Cognition Arousal/Alertness: Awake/alert Behavior During Therapy: WFL for tasks assessed/performed Overall Cognitive Status: Impaired/Different from baseline Area of Impairment: Orientation                 Orientation Level: Disoriented to;Place     Following Commands: Follows one step commands consistently       General Comments: Patient is able to follow commands but is often resistant, posing a safety risk.      Exercises Other Exercises Other Exercises: Assisted with rolling pt and positioning for donning  gown and changing brief prior to transfer.  Pt participates intermittently and is able to follow directions though she does not consistently.  x12 min    General Comments        Pertinent Vitals/Pain      Home Living                      Prior Function            PT Goals (current goals can now be found in the care plan section) Acute Rehab PT Goals PT Goal Formulation: Patient unable to participate in goal setting    Frequency    Min 2X/week      PT Plan Current plan remains appropriate    Co-evaluation              AM-PAC PT "6 Clicks" Mobility   Outcome Measure  Help needed turning from your back to your side while in a flat bed without using bedrails?: A Lot Help needed moving from lying on your back to sitting on the side of a flat bed without using bedrails?: A Lot Help needed moving to and from a bed to a chair (including a wheelchair)?: Total Help needed standing up from a chair using your arms (e.g., wheelchair or bedside chair)?: Total Help needed to walk in hospital room?: Total Help needed climbing 3-5 steps with a railing? : Total 6 Click Score: 8    End of Session Equipment Utilized During Treatment: Gait belt Activity Tolerance: Treatment limited secondary to agitation Patient left: with call bell/phone within reach;with nursing/sitter in room;in chair;with chair alarm set Nurse Communication: Mobility status PT Visit Diagnosis: Muscle weakness (generalized) (M62.81);History of falling (Z91.81);Other abnormalities of gait and mobility (R26.89)     Time: 2993-7169 PT Time Calculation (min) (ACUTE ONLY): 33 min  Charges:  $Therapeutic Activity: 23-37 mins                     Roxanne Gates, PT, DPT    Roxanne Gates 11/17/2018, 5:27 PM

## 2018-11-17 NOTE — Progress Notes (Signed)
[facility] Gastroenterology Inpatient Progress Note  Subjective: Patient seen for f/u encephalopathy, renal failure, ascites, presumptive cirrhosis. Patient resting, no acute distress. When questioned about her status, she says, "I feel fine".   Objective: Vital signs in last 24 hours: Temp:  [97.4 F (36.3 C)-98 F (36.7 C)] 98 F (36.7 C) (11/29 1158) Pulse Rate:  [68-81] 81 (11/29 1158) Resp:  [16-18] 18 (11/29 1158) BP: (91-131)/(61-77) 91/68 (11/29 1158) SpO2:  [98 %-100 %] 100 % (11/29 1158) Blood pressure 91/68, pulse 81, temperature 98 F (36.7 C), temperature source Axillary, resp. rate 18, height 5\' 9"  (1.753 m), weight 65.3 kg, SpO2 100 %.    Intake/Output from previous day: 11/28 0701 - 11/29 0700 In: 2198.6 [P.O.:600; I.V.:216.1; IV Piggyback:1382.5] Out: 425 [Urine:425]  Intake/Output this shift: Total I/O In: -  Out: 100 [Urine:100]   General appearance:  Somnolent, no distress Resp: CTA Cardio:  RRR GI:  Fluid wave present, non-tense, Soft, nt. BS+ Extremities:  NO edema.   Lab Results: No results found for this or any previous visit (from the past 24 hour(s)).   No results for input(s): WBC, HGB, HCT, PLT in the last 72 hours. BMET Recent Labs    11/15/18 0500 11/16/18 0527  NA 141 137  K 3.3* 3.5  CL 104 106  CO2 24 23  GLUCOSE 108* 105*  BUN 29* 27*  CREATININE 2.45* 2.32*  CALCIUM 9.5 8.8*   LFT No results for input(s): PROT, ALBUMIN, AST, ALT, ALKPHOS, BILITOT, BILIDIR, IBILI in the last 72 hours. PT/INR No results for input(s): LABPROT, INR in the last 72 hours. Hepatitis Panel No results for input(s): HEPBSAG, HCVAB, HEPAIGM, HEPBIGM in the last 72 hours. C-Diff No results for input(s): CDIFFTOX in the last 72 hours. No results for input(s): CDIFFPCR in the last 72 hours.   Studies/Results: US Venous Img Upper Uni Left  Result Date: 11/17/2018 CLINICAL DATA:  Left upper extremity swelling EXAM: LEFT UPPER EXTREMITY  VENOUS DOPPLER ULTRASOUND TECHNIQUE: Gray-scale sonography with graded compression, as well as color Doppler and duplex ultrasound were performed to evaluate the upper extremity deep venous system from the level of the subclavian vein and including the jugular, axillary, basilic, radial, ulnar and upper cephalic vein. Spectral Doppler was utilized to evaluate flow at rest and with distal augmentation maneuvers. COMPARISON:  None. FINDINGS: Contralateral Subclavian Vein: Respiratory phasicity is normal and symmetric with the symptomatic side. No evidence of thrombus. Normal compressibility. Internal Jugular Vein: No evidence of thrombus. Normal compressibility, respiratory phasicity and response to augmentation. Subclavian Vein: No evidence of thrombus. Normal compressibility, respiratory phasicity and response to augmentation. Axillary Vein: No evidence of thrombus. Normal compressibility, respiratory phasicity and response to augmentation. Cephalic Vein: Small caliber in the mid and distal arm. Patent proximally. Basilic Vein: Not visualized Brachial Veins: No evidence of thrombus. Normal compressibility, respiratory phasicity and response to augmentation. Radial Veins: No evidence of thrombus. Normal compressibility, respiratory phasicity and response to augmentation. Ulnar Veins: No evidence of thrombus. Normal compressibility, respiratory phasicity and response to augmentation. Venous Reflux:  None visualized. Other Findings:  None visualized. IMPRESSION: No evidence of DVT within the left upper extremity. Electronically Signed   By: Rolm Baptise M.D.   On: 11/17/2018 08:48    Scheduled Inpatient Medications:   . feeding supplement (ENSURE ENLIVE)  237 mL Oral BID BM  . folic acid  1 mg Oral Daily  . lactulose  30 g Oral TID  . metoprolol tartrate  25 mg Oral  BID  . midodrine  5 mg Oral TID WC  . multivitamin with minerals  1 tablet Oral Daily  . nicotine  21 mg Transdermal Daily  . octreotide  100  mcg Subcutaneous TID  . rifaximin  550 mg Oral BID  . spironolactone  25 mg Oral Daily  . thiamine  100 mg Oral Daily  . venlafaxine  37.5 mg Oral BID WC  . vitamin C  250 mg Oral BID    Continuous Inpatient Infusions:   . sodium chloride Stopped (11/15/18 1607)  . albumin human 12.5 g (11/17/18 1601)    PRN Inpatient Medications:  sodium chloride, acetaminophen **OR** acetaminophen, guaiFENesin-dextromethorphan, metoprolol tartrate, ondansetron **OR** ondansetron (ZOFRAN) IV, senna-docusate  Assessment:  1. Diuretic-resistant ascites - On midodrine, albumin. Serial paracenteses are necessary.   2. Wernicke Korsakoff psychosis.  3. Renal insufficiency.  4. Hepatorenal, possibly type II.  Plan:  1. Continue serial paracentesis only as often as necessary. Theoretically higher risk of peritonitis with more frequent paracenteses. 2. Cornerstone of therapy for now will be Na restriction and possibly fluid restriction. 3. May need to consider outpatient TIPS to control ascites. Mental status already likely going go to be chronic, so the risk of encephalopathy from TIPS is not likely going to affect her current mentation.    [name], M.D. 11/17/2018, 4:08 PM

## 2018-11-17 NOTE — Progress Notes (Addendum)
[facility] [place] NAME: [name]    MR#:  [id number]  DATE OF BIRTH:  Aug 17, 1956  SUBJECTIVE:  CHIEF COMPLAINT:   Chief Complaint  Patient presents with  . Altered Mental Status  Patient seen today Responds to verbal commands Appetite ok Has periods of confusion on and off Abdominal distention secondary to ascites better after paracentesis Left upper extremity swelling No fever Had paracentesis day before yesterday   REVIEW OF SYSTEMS:    ROS Review of Systems  Constitutional: Generalized weakness HENT: Negative.   Eyes: Negative.   Respiratory: Negative.   Cardiovascular: Negative.   Gastrointestinal: Has decreased abdominal distention Genitourinary: Negative.   Musculoskeletal: Negative.   Skin: Left upper extremity swelling with weeping fluid Neurological: Not completely oriented to time place and person Endo/Heme/Allergies: Negative.   Psychiatric/Behavioral: periods of confusion All other systems reviewed and are negative.  DRUG ALLERGIES:  No Known Allergies  VITALS:  Blood pressure 131/77, pulse 68, temperature (!) 97.4 F (36.3 C), temperature source Oral, resp. rate 16, height 5\' 9"  (1.753 m), weight 65.3 kg, SpO2 99 %.  PHYSICAL EXAMINATION:   Physical Exam  GENERAL:  62 y.o.-year-old patient lying in the bed  EYES: Pupils equal, round, reactive to light and accommodation. No scleral icterus. Extraocular muscles intact.  HEENT: Head atraumatic, normocephalic. Oropharynx and nasopharynx clear.  NECK:  Supple, no jugular venous distention. No thyroid enlargement, no tenderness.  LUNGS: Normal breath sounds bilaterally, no wheezing, rales, rhonchi. No use of accessory muscles of respiration.  CARDIOVASCULAR: S1, S2 normal. No murmurs, rubs, or gallops.  ABDOMEN: Soft, nontender, has decreased distention. Bowel sounds present. No organomegaly or mass.  EXTREMITIES: No cyanosis, clubbing  1 plus edema b/l.     Left upper extremity swelling with fluid seeping NEUROLOGIC: Cranial nerves II through XII are intact. No focal Motor or sensory deficits b/l.   PSYCHIATRIC: Oriented to self and place SKIN: No obvious rash, lesion, or ulcer.   LABORATORY PANEL:   CBC Recent Labs  Lab 11/13/18 0321  WBC 4.1  HGB 9.5*  HCT 28.2*  PLT 233   ------------------------------------------------------------------------------------------------------------------ Chemistries  Recent Labs  Lab 11/13/18 0321  11/15/18 0500 11/16/18 0527  NA 138   < > 141 137  K 2.7*   < > 3.3* 3.5  CL 104   < > 104 106  CO2 25   < > 24 23  GLUCOSE 133*   < > 108* 105*  BUN 34*   < > 29* 27*  CREATININE 2.38*   < > 2.45* 2.32*  CALCIUM 8.7*   < > 9.5 8.8*  MG  --    < > 2.1  --   AST 26  --   --   --   ALT 11  --   --   --   ALKPHOS 44  --   --   --   BILITOT 1.1  --   --   --    < > = values in this interval not displayed.   ------------------------------------------------------------------------------------------------------------------  Cardiac Enzymes No results for input(s): TROPONINI in the last 168 hours. ------------------------------------------------------------------------------------------------------------------  RADIOLOGY:  US Venous Img Upper Uni Left  Result Date: 11/17/2018 CLINICAL DATA:  Left upper extremity swelling EXAM: LEFT UPPER EXTREMITY VENOUS DOPPLER ULTRASOUND TECHNIQUE: Gray-scale sonography with graded compression, as well as color Doppler and duplex ultrasound were performed to evaluate the upper extremity deep venous system from the level of the  subclavian vein and including the jugular, axillary, basilic, radial, ulnar and upper cephalic vein. Spectral Doppler was utilized to evaluate flow at rest and with distal augmentation maneuvers. COMPARISON:  None. FINDINGS: Contralateral Subclavian Vein: Respiratory phasicity is normal and symmetric with the symptomatic side. No evidence  of thrombus. Normal compressibility. Internal Jugular Vein: No evidence of thrombus. Normal compressibility, respiratory phasicity and response to augmentation. Subclavian Vein: No evidence of thrombus. Normal compressibility, respiratory phasicity and response to augmentation. Axillary Vein: No evidence of thrombus. Normal compressibility, respiratory phasicity and response to augmentation. Cephalic Vein: Small caliber in the mid and distal arm. Patent proximally. Basilic Vein: Not visualized Brachial Veins: No evidence of thrombus. Normal compressibility, respiratory phasicity and response to augmentation. Radial Veins: No evidence of thrombus. Normal compressibility, respiratory phasicity and response to augmentation. Ulnar Veins: No evidence of thrombus. Normal compressibility, respiratory phasicity and response to augmentation. Venous Reflux:  None visualized. Other Findings:  None visualized. IMPRESSION: No evidence of DVT within the left upper extremity. Electronically Signed   By: Rolm Baptise M.D.   On: 11/17/2018 08:48   US Paracentesis  Result Date: 11/15/2018 INDICATION: Ascites EXAM: ULTRASOUND GUIDED  PARACENTESIS MEDICATIONS: None. COMPLICATIONS: None immediate. PROCEDURE: Informed written consent was obtained from the patient after a discussion of the risks, benefits and alternatives to treatment. A timeout was performed prior to the initiation of the procedure. Initial ultrasound scanning demonstrates a large amount of ascites within the left lower abdominal quadrant. The left lower abdomen was prepped and draped in the usual sterile fashion. 1% lidocaine with epinephrine was used for local anesthesia. Following this, a 6 Fr Safe-T-Centesis catheter was introduced. An ultrasound image was saved for documentation purposes. The paracentesis was performed. The catheter was removed and a dressing was applied. The patient tolerated the procedure well without immediate post procedural complication.  FINDINGS: A total of approximately 5 L of clear yellow fluid was removed. IMPRESSION: Successful ultrasound-guided paracentesis yielding 5 liters of peritoneal fluid. Electronically Signed   By: Marybelle Killings M.D.   On: 11/15/2018 16:15     ASSESSMENT AND PLAN:  62 year old female patient with history of tobacco abuse, alcohol abuse, liver cirrhosis, atrial fibrillation currently under hospitalist service for altered mental status and abdominal distention  -Decompensated liver cirrhosis with  ascites Ultrasound-guided paracentesis done day before yesterday and 5 L of fluid were removed No GI follow-up appreciated Continue albumin, octreotide and midodrine for now  -Acute encephalopathy improving Possibly Wernicke's from chronic alcohol use Thiamine supplementation to orally Multifactorial in etiology Continue oral lactulose to avoid hepatic encephalopathy Completed IV antibiotics therapy  -Left upper extremity edema Secondary to fluid overload No evidence of DVT on Doppler venous ultrasound  -Hyponatremia improved Appreciate nephrology follow-up Sodium tablets deferred to avoid worsening of ascites  -Metabolic acidosis improved off IV sodium bicarbonate fluids  -Acute renal failure Creatinine remaining stable Probability from hepatorenal syndrome Continue oral midodrine Continue octreotide Continue albumin No new i acute ntervention  -Acute hypokalemia Replace potassium aggressively  -Acute hypomagnesemia resolved  -Chronic atrial fibrillation Discontinue oral Cardizem in view of poor ejection fraction Rate controlled with oral metoprolol Not a candidate for anticoagulation  -Tobacco abuse On nicotine patch  -DVT prophylaxis with sequential compression device to lower extremities  -Overall long term prognosis poor Family aware of medical condition and treatment plan and overall poor prognosis Palliative medicine consult appreciated  -Ambulatory dysfunction  physical deconditioning Physical therapy evaluation for strengthening if patient able to perform We will discuss with  Education officer, museum for disposition Discussed with family regarding home situation   All the records are reviewed and case discussed with Care Management/Social Worker. Management plans discussed with the patient, family and they are in agreement.  CODE STATUS: Full code  DVT Prophylaxis: SCDs  TOTAL TIME TAKING CARE OF THIS PATIENT: 24 minutes.   POSSIBLE D/C IN 2 to 3 DAYS, DEPENDING ON CLINICAL CONDITION.  [name] M.D on 11/17/2018 at 11:09 AM  Between 7am to 6pm - Pager - [phone]  After 6pm go to [email or web address] - password [email or web address] [facility]  Office  [phone]  CC: Primary care physician; Alwyn Pea, NP  Note: This dictation was prepared with Dragon dictation along with smaller phrase technology. Any transcriptional errors that result from this process are unintentional.

## 2018-11-17 NOTE — Progress Notes (Signed)
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   Medical History: Past Medical History:  Diagnosis Date  . Cancer ([place])    skin  . Depression   . ETOH abuse     Medications:  Scheduled:  . feeding supplement (ENSURE ENLIVE)  237 mL Oral BID BM  . folic acid  1 mg Oral Daily  . lactulose  30 g Oral TID  . metoprolol tartrate  25 mg Oral BID  . midodrine  5 mg Oral TID WC  . multivitamin with minerals  1 tablet Oral Daily  . nicotine  21 mg Transdermal Daily  . octreotide  100 mcg Subcutaneous TID  . rifaximin  550 mg Oral BID  . spironolactone  25 mg Oral Daily  . thiamine  100 mg Oral Daily  . venlafaxine  37.5 mg Oral BID WC  . vitamin C  250 mg Oral BID   Infusions:  . sodium chloride Stopped (11/15/18 1607)  . albumin human Stopped (11/16/18 2225)  . thiamine 500mg  in normal saline (77ml) IVPB Stopped (11/16/18 2302)   PRN: sodium chloride, acetaminophen **OR** acetaminophen, guaiFENesin-dextromethorphan, metoprolol tartrate, ondansetron **OR** ondansetron (ZOFRAN) IV, senna-docusate  Assessment: 62 year old female patient with history of tobacco abuse, alcohol abuse, liver cirrhosis, atrial fibrillation currently under hospitalist service for altered mental status and abdominal distention.  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 Patient also has edema and ascites will limited IV fluids. Doses of thiamine > 100 mg need to be given by slow infusion. Patient has received thiamine 100 mg PO x 4 days.    Plan:  Will give IV thiamine 200 mg IVPB TID for 3 days. If patient clinically improves - switch to thiamine 100 mg daily for 1-2 weeks.   11/29-  PO  thiamine 100 mg daily.  Mell Mellott A, PharmD  11/17/2018,7:32 AM

## 2018-11-18 NOTE — Progress Notes (Signed)
[facility]  ROUNDING NOTE   Subjective:   Patient is more alert today No complaints of shortness of breath   Objective:  Vital signs in last 24 hours:  Temp:  [97.4 F (36.3 C)-97.6 F (36.4 C)] 97.6 F (36.4 C) (11/30 1146) Pulse Rate:  [64-71] 69 (11/30 1146) Resp:  [17-18] 18 (11/30 1146) BP: (106-117)/(70-84) 117/84 (11/30 1146) SpO2:  [98 %-100 %] 98 % (11/30 1146)  Weight change:  Filed Weights   11/09/18 1321  Weight: 65.3 kg    Intake/Output: I/O last 3 completed shifts: In: 620.5 [P.O.:120; IV Piggyback:500.5] Out: 700 [Urine:700]   Intake/Output this shift:  Total I/O In: 120 [P.O.:120] Out: 50 [Urine:50]  Physical Exam: General: NAD,   Head/ENT Normocephalic, atraumatic. Moist oral mucosal membranes  Neck: Supple, trachea midline  Lungs:  Clear to auscultation  Heart: Regular rate and rhythm  Abdomen:  + ascites  Extremities:  trace peripheral edema.  Neurologic:  Alert, able to answer questions appropriately        Basic Metabolic Panel: Recent Labs  Lab 11/12/18 0435 11/13/18 0321 11/14/18 0526 11/15/18 0500 11/16/18 0527  NA 134* 138 140 141 137  K 3.1* 2.7* 2.8* 3.3* 3.5  CL 104 104 103 104 106  CO2 20* 25 24 24 23   GLUCOSE 121* 133* 116* 108* 105*  BUN 35* 34* 30* 29* 27*  CREATININE 2.58* 2.38* 2.54* 2.45* 2.32*  CALCIUM 8.4* 8.7* 9.4 9.5 8.8*  MG  --   --  1.6* 2.1  --   PHOS  --   --  2.7  --   --     Liver Function Tests: Recent Labs  Lab 11/12/18 0435 11/13/18 0321  AST 27 26  ALT 11 11  ALKPHOS 53 44  BILITOT 1.3* 1.1  PROT 4.7* 4.9*  ALBUMIN 3.0* 3.3*   No results for input(s): LIPASE, AMYLASE in the last 168 hours. No results for input(s): AMMONIA in the last 168 hours.  CBC: Recent Labs  Lab 11/13/18 0321  WBC 4.1  HGB 9.5*  HCT 28.2*  MCV 98.3  PLT 233    Cardiac Enzymes: No results for input(s): CKTOTAL, CKMB, CKMBINDEX, TROPONINI in the last 168 hours.  BNP: Invalid input(s):  POCBNP  CBG: Recent Labs  Lab 11/16/18 0749  GLUCAP 103*    Microbiology: Results for orders placed or performed during the hospital encounter of 11/09/18  Body fluid culture     Status: None   Collection Time: 11/10/18 12:19 PM  Result Value Ref Range Status   Specimen Description   Final    PERITONEAL Performed at [facility], [address] [place], [place] [address]    Special Requests   Final    NONE Performed at [facility], [address] [place], [place] [address]    Gram Stain   Final    WBC PRESENT,BOTH PMN AND MONONUCLEAR NO ORGANISMS SEEN CYTOSPIN SMEAR    Culture   Final    NO GROWTH 3 DAYS Performed at [facility], [place] [address] [place],  [address]    Report Status 11/13/2018 FINAL  Final  Fungus Culture With Stain     Status: None (Preliminary result)   Collection Time: 11/10/18 12:19 PM  Result Value Ref Range Status   Fungus Stain Final report  Final    Comment: (NOTE) Performed At: [facility] [place], [place] [id number] Rush Farmer MD [id number]    Fungus (Mycology)  Culture PENDING  Incomplete   Fungal Source PERITONEAL  Final    Comment: Performed at [facility], [address] [place], [place] [address]  Fungus Culture Result     Status: None   Collection Time: 11/10/18 12:19 PM  Result Value Ref Range Status   Result 1 Comment  Final    Comment: (NOTE) KOH/Calcofluor preparation:  no fungus observed. Performed At: [facility] [place], [place] [id number] Rush Farmer MD [id number]   Urine Culture     Status: Abnormal   Collection Time: 11/10/18  4:28 PM  Result Value Ref Range Status   Specimen Description   Final    URINE, RANDOM Performed at [facility], [address] [place], [place] [address]    Special Requests   Final    NONE Performed at [facility], [place],  [address]     Culture 10,000 COLONIES/mL YEAST (A)  Final   Report Status 11/12/2018 FINAL  Final    Coagulation Studies: No results for input(s): LABPROT, INR in the last 72 hours.  Urinalysis: No results for input(s): COLORURINE, LABSPEC, PHURINE, GLUCOSEU, HGBUR, BILIRUBINUR, KETONESUR, PROTEINUR, UROBILINOGEN, NITRITE, LEUKOCYTESUR in the last 72 hours.  Invalid input(s): APPERANCEUR    Imaging: US Venous Img Upper Uni Left  Result Date: 11/17/2018 CLINICAL DATA:  Left upper extremity swelling EXAM: LEFT UPPER EXTREMITY VENOUS DOPPLER ULTRASOUND TECHNIQUE: Gray-scale sonography with graded compression, as well as color Doppler and duplex ultrasound were performed to evaluate the upper extremity deep venous system from the level of the subclavian vein and including the jugular, axillary, basilic, radial, ulnar and upper cephalic vein. Spectral Doppler was utilized to evaluate flow at rest and with distal augmentation maneuvers. COMPARISON:  None. FINDINGS: Contralateral Subclavian Vein: Respiratory phasicity is normal and symmetric with the symptomatic side. No evidence of thrombus. Normal compressibility. Internal Jugular Vein: No evidence of thrombus. Normal compressibility, respiratory phasicity and response to augmentation. Subclavian Vein: No evidence of thrombus. Normal compressibility, respiratory phasicity and response to augmentation. Axillary Vein: No evidence of thrombus. Normal compressibility, respiratory phasicity and response to augmentation. Cephalic Vein: Small caliber in the mid and distal arm. Patent proximally. Basilic Vein: Not visualized Brachial Veins: No evidence of thrombus. Normal compressibility, respiratory phasicity and response to augmentation. Radial Veins: No evidence of thrombus. Normal compressibility, respiratory phasicity and response to augmentation. Ulnar Veins: No evidence of thrombus. Normal compressibility, respiratory phasicity and response to augmentation. Venous  Reflux:  None visualized. Other Findings:  None visualized. IMPRESSION: No evidence of DVT within the left upper extremity. Electronically Signed   By: Rolm Baptise M.D.   On: 11/17/2018 08:48     Medications:   . sodium chloride Stopped (11/15/18 1607)  . albumin human 12.5 g (11/18/18 0922)   . feeding supplement (ENSURE ENLIVE)  237 mL Oral BID BM  . folic acid  1 mg Oral Daily  . lactulose  30 g Oral TID  . metoprolol tartrate  25 mg Oral BID  . midodrine  5 mg Oral TID WC  . multivitamin with minerals  1 tablet Oral Daily  . nicotine  21 mg Transdermal Daily  . octreotide  100 mcg Subcutaneous TID  . rifaximin  550 mg Oral BID  . spironolactone  25 mg Oral Daily  . thiamine  100 mg Oral Daily  . venlafaxine  37.5 mg Oral BID WC  . vitamin C  250 mg Oral BID   sodium  chloride, acetaminophen **OR** acetaminophen, guaiFENesin-dextromethorphan, metoprolol tartrate, ondansetron **OR** ondansetron (ZOFRAN) IV, senna-docusate  Assessment/ Plan:  [name] is a 62 y.o. white female [name] is a 62 y.o. white female with hepatic cirrhosis, atrial fibrillation, EtOH, depression, who was admitted to [facility] on 11/09/2018 for Hyponatremia and acute renal failure  1. Hyponatremia: secondary to hepatic cirrhosis.  - Avoid IV fluids  - Na corrected  2. Acute Renal Failure:  Secondary to hemodynamic changes Baseline creatinine of 0.88 on 10/29/18  - Contonue albumin, reduced dose of 12.5 gm BID - Continue midodrine - Continue octreotide.   3. Hypokalemia -Continue spironolactone -Potassium level is now corrected  4. Edema, cirrhosis and ascites, hepatic encephalopathy status post large volume paracentesis on 11/22 and 11/27 5+5 liters removed.  Management as per GI/internal medicine teams     LOS: [place] 11/30/201912:57 PM

## 2018-11-18 NOTE — Progress Notes (Signed)
[facility]astroenterology Inpatient Progress Note  Subjective: Patient seen for f/u cirrhosis, ascites, encephalopathy. Patient alert, NAD. Still confused. Denies SOB or pain.  Objective: Vital signs in last 24 hours: Temp:  [97.4 F (36.3 C)-97.6 F (36.4 C)] 97.6 F (36.4 C) (11/30 1146) Pulse Rate:  [64-71] 69 (11/30 1146) Resp:  [17-18] 18 (11/30 1146) BP: (106-117)/(70-84) 117/84 (11/30 1146) SpO2:  [98 %-100 %] 98 % (11/30 1146) Blood pressure 117/84, pulse 69, temperature 97.6 F (36.4 C), temperature source Oral, resp. rate 18, height 5\' 9"  (1.753 m), weight 65.3 kg, SpO2 98 %.    Intake/Output from previous day: 11/29 0701 - 11/30 0700 In: 197.1 [P.O.:120; IV Piggyback:77.1] Out: 450 [Urine:450]  Intake/Output this shift: Total I/O In: 120 [P.O.:120] Out: 50 [Urine:50]   General appearance:  Alert, confused. Resp:  CTA Cardio: RRR GI:  Soft, distended with fluid wave. No rebound. BS+ Extremities:  No edema.    Lab Results: No results found for this or any previous visit (from the past 24 hour(s)).   No results for input(s): WBC, HGB, HCT, PLT in the last 72 hours. BMET Recent Labs    11/16/18 0527  NA 137  K 3.5  CL 106  CO2 23  GLUCOSE 105*  BUN 27*  CREATININE 2.32*  CALCIUM 8.8*   LFT No results for input(s): PROT, ALBUMIN, AST, ALT, ALKPHOS, BILITOT, BILIDIR, IBILI in the last 72 hours. PT/INR No results for input(s): LABPROT, INR in the last 72 hours. Hepatitis Panel No results for input(s): HEPBSAG, HCVAB, HEPAIGM, HEPBIGM in the last 72 hours. C-Diff No results for input(s): CDIFFTOX in the last 72 hours. No results for input(s): CDIFFPCR in the last 72 hours.   Studies/Results: US Venous Img Upper Uni Left  Result Date: 11/17/2018 CLINICAL DATA:  Left upper extremity swelling EXAM: LEFT UPPER EXTREMITY VENOUS DOPPLER ULTRASOUND TECHNIQUE: Gray-scale sonography with graded compression, as well as color Doppler and duplex  ultrasound were performed to evaluate the upper extremity deep venous system from the level of the subclavian vein and including the jugular, axillary, basilic, radial, ulnar and upper cephalic vein. Spectral Doppler was utilized to evaluate flow at rest and with distal augmentation maneuvers. COMPARISON:  None. FINDINGS: Contralateral Subclavian Vein: Respiratory phasicity is normal and symmetric with the symptomatic side. No evidence of thrombus. Normal compressibility. Internal Jugular Vein: No evidence of thrombus. Normal compressibility, respiratory phasicity and response to augmentation. Subclavian Vein: No evidence of thrombus. Normal compressibility, respiratory phasicity and response to augmentation. Axillary Vein: No evidence of thrombus. Normal compressibility, respiratory phasicity and response to augmentation. Cephalic Vein: Small caliber in the mid and distal arm. Patent proximally. Basilic Vein: Not visualized Brachial Veins: No evidence of thrombus. Normal compressibility, respiratory phasicity and response to augmentation. Radial Veins: No evidence of thrombus. Normal compressibility, respiratory phasicity and response to augmentation. Ulnar Veins: No evidence of thrombus. Normal compressibility, respiratory phasicity and response to augmentation. Venous Reflux:  None visualized. Other Findings:  None visualized. IMPRESSION: No evidence of DVT within the left upper extremity. Electronically Signed   By: Rolm Baptise M.D.   On: 11/17/2018 08:48    Scheduled Inpatient Medications:   . feeding supplement (ENSURE ENLIVE)  237 mL Oral BID BM  . folic acid  1 mg Oral Daily  . lactulose  30 g Oral TID  . metoprolol tartrate  25 mg Oral BID  . midodrine  5 mg Oral TID WC  . multivitamin with minerals  1 tablet Oral Daily  .  nicotine  21 mg Transdermal Daily  . octreotide  100 mcg Subcutaneous TID  . rifaximin  550 mg Oral BID  . spironolactone  25 mg Oral Daily  . thiamine  100 mg Oral Daily   . venlafaxine  37.5 mg Oral BID WC  . vitamin C  250 mg Oral BID    Continuous Inpatient Infusions:   . sodium chloride Stopped (11/15/18 1607)  . 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 Serial paracenteses are necessary.   2. Wernicke Korsakoff psychosis.  3. Renal insufficiency.  4. Hepatorenal, possibly type II.  Plan:  1. Continue serial paracentesis only as often as necessary. Theoretically higher risk of peritonitis with more frequent paracenteses. 2. Cornerstone of therapy for now will be Na restriction and possibly fluid restriction. 3. May need to consider outpatient TIPS to control ascites. Mental status already likely going go to be chronic, so the risk of encephalopathy from TIPS is not likely going to affect her current mentation.  4. No new recommendations right now.   Xaidyn Kepner K. Alice Reichert, M.D. 11/18/2018, 2:46 PM

## 2018-11-18 NOTE — Progress Notes (Signed)
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   Medical History: Past Medical History:  Diagnosis Date  . Cancer ([place])    skin  . Depression   . ETOH abuse     Medications:  Scheduled:  . feeding supplement (ENSURE ENLIVE)  237 mL Oral BID BM  . folic acid  1 mg Oral Daily  . lactulose  30 g Oral TID  . metoprolol tartrate  25 mg Oral BID  . midodrine  5 mg Oral TID WC  . multivitamin with minerals  1 tablet Oral Daily  . nicotine  21 mg Transdermal Daily  . octreotide  100 mcg Subcutaneous TID  . rifaximin  550 mg Oral BID  . spironolactone  25 mg Oral Daily  . thiamine  100 mg Oral Daily  . venlafaxine  37.5 mg Oral BID WC  . vitamin C  250 mg Oral BID   Infusions:  . sodium chloride Stopped (11/15/18 1607)  . albumin human Stopped (11/18/18 0118)   PRN: sodium chloride, acetaminophen **OR** acetaminophen, guaiFENesin-dextromethorphan, metoprolol tartrate, ondansetron **OR** ondansetron (ZOFRAN) IV, senna-docusate  Assessment: 62 year old female patient with history of tobacco abuse, alcohol abuse, liver cirrhosis, atrial fibrillation currently under hospitalist service for altered mental status and abdominal distention.  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 Patient also has edema and ascites will limited IV fluids. Doses of thiamine > 100 mg need to be given by slow infusion.   Plan:  Patient received  IV thiamine 200 mg IVPB TID for 3 days. Patient clinically improved and was switched to thiamine 100 mg daily. Plan to continue for 1-2 weeks.   11/29-  PO thiamine 100 mg daily started.   Pernell Dupre, PharmD, BCPS Clinical Pharmacist 11/18/2018 9:03 AM

## 2018-11-18 NOTE — Progress Notes (Signed)
[facility] [place] NAME: Timberlyn Pickford    MR#:  [id number]  DATE OF BIRTH:  August 16, 1956  SUBJECTIVE:  CHIEF COMPLAINT:   Chief Complaint  Patient presents with  . Altered Mental Status  Patient seen today Responds to verbal commands Appetite ok Abdominal distention secondary to ascites better after paracentesis Left upper extremity swelling improving No fever Was evaluated by physical therapy and needs a lot of assistance  REVIEW OF SYSTEMS:    ROS Review of Systems  Constitutional: Generalized weakness HENT: Negative.   Eyes: Negative.   Respiratory: Negative.   Cardiovascular: Negative.   Gastrointestinal: Has decreased abdominal distention Genitourinary: Negative.   Musculoskeletal: Negative.   Skin: Left upper extremity swelling  Neurological: Not completely oriented to time place and person Endo/Heme/Allergies: Negative.   Psychiatric/Behavioral: periods of confusion All other systems reviewed and are negative.  DRUG ALLERGIES:  No Known Allergies  VITALS:  Blood pressure 110/78, pulse 66, temperature (!) 97.4 F (36.3 C), temperature source Oral, resp. rate 17, height 5\' 9"  (1.753 m), weight 65.3 kg, SpO2 99 %.  PHYSICAL EXAMINATION:   Physical Exam  GENERAL:  62 y.o.-year-old patient lying in the bed  EYES: Pupils equal, round, reactive to light and accommodation. No scleral icterus. Extraocular muscles intact.  HEENT: Head atraumatic, normocephalic. Oropharynx and nasopharynx clear.  NECK:  Supple, no jugular venous distention. No thyroid enlargement, no tenderness.  LUNGS: Normal breath sounds bilaterally, no wheezing, rales, rhonchi. No use of accessory muscles of respiration.  CARDIOVASCULAR: S1, S2 normal. No murmurs, rubs, or gallops.  ABDOMEN: Soft, nontender, has decreased distention. Bowel sounds present. No organomegaly or mass.  EXTREMITIES: No cyanosis, clubbing  1 plus edema b/l.    Left upper  extremity swelling  NEUROLOGIC: Cranial nerves II through XII are intact. No focal Motor or sensory deficits b/l.   PSYCHIATRIC: Oriented to self and place SKIN: No obvious rash, lesion, or ulcer.   LABORATORY PANEL:   CBC Recent Labs  Lab 11/13/18 0321  WBC 4.1  HGB 9.5*  HCT 28.2*  PLT 233   ------------------------------------------------------------------------------------------------------------------ Chemistries  Recent Labs  Lab 11/13/18 0321  11/15/18 0500 11/16/18 0527  NA 138   < > 141 137  K 2.7*   < > 3.3* 3.5  CL 104   < > 104 106  CO2 25   < > 24 23  GLUCOSE 133*   < > 108* 105*  BUN 34*   < > 29* 27*  CREATININE 2.38*   < > 2.45* 2.32*  CALCIUM 8.7*   < > 9.5 8.8*  MG  --    < > 2.1  --   AST 26  --   --   --   ALT 11  --   --   --   ALKPHOS 44  --   --   --   BILITOT 1.1  --   --   --    < > = values in this interval not displayed.   ------------------------------------------------------------------------------------------------------------------  Cardiac Enzymes No results for input(s): TROPONINI in the last 168 hours. ------------------------------------------------------------------------------------------------------------------  RADIOLOGY:  US Venous Img Upper Uni Left  Result Date: 11/17/2018 CLINICAL DATA:  Left upper extremity swelling EXAM: LEFT UPPER EXTREMITY VENOUS DOPPLER ULTRASOUND TECHNIQUE: Gray-scale sonography with graded compression, as well as color Doppler and duplex ultrasound were performed to evaluate the upper extremity deep venous system from the level of the subclavian vein and including the  jugular, axillary, basilic, radial, ulnar and upper cephalic vein. Spectral Doppler was utilized to evaluate flow at rest and with distal augmentation maneuvers. COMPARISON:  None. FINDINGS: Contralateral Subclavian Vein: Respiratory phasicity is normal and symmetric with the symptomatic side. No evidence of thrombus. Normal  compressibility. Internal Jugular Vein: No evidence of thrombus. Normal compressibility, respiratory phasicity and response to augmentation. Subclavian Vein: No evidence of thrombus. Normal compressibility, respiratory phasicity and response to augmentation. Axillary Vein: No evidence of thrombus. Normal compressibility, respiratory phasicity and response to augmentation. Cephalic Vein: Small caliber in the mid and distal arm. Patent proximally. Basilic Vein: Not visualized Brachial Veins: No evidence of thrombus. Normal compressibility, respiratory phasicity and response to augmentation. Radial Veins: No evidence of thrombus. Normal compressibility, respiratory phasicity and response to augmentation. Ulnar Veins: No evidence of thrombus. Normal compressibility, respiratory phasicity and response to augmentation. Venous Reflux:  None visualized. Other Findings:  None visualized. IMPRESSION: No evidence of DVT within the left upper extremity. Electronically Signed   By: Rolm Baptise M.D.   On: 11/17/2018 08:48     ASSESSMENT AND PLAN:  62 year old female patient with history of tobacco abuse, alcohol abuse, liver cirrhosis, atrial fibrillation currently under hospitalist service for altered mental status and abdominal distention  -Decompensated liver cirrhosis with  ascites Status post paracentesis twice in this hospitalization with 10 L of fluid being removed Appreciate GI follow-up On octreotide, albumin and midodrine Continue Xifaxan  -Acute encephalopathy Mental status waxes and wanes Possibly Wernicke's from chronic alcohol use Thiamine supplementation  orally Multifactorial in etiology Continue oral lactulose to avoid hepatic encephalopathy Completed IV antibiotics therapy this hospitalization  -Left upper extremity edema Secondary to liver disease probably anasarca No evidence of DVT on Doppler venous ultrasound Clinical monitoring Continue Aldactone for diuresis  -Hyponatremia  improved Appreciate nephrology follow-up Sodium tablets deferred to avoid worsening of ascites  -Metabolic acidosis improved off IV sodium bicarbonate fluids  -Hepatorenal syndrome Stable No acute intervention  -Chronic atrial fibrillation Discontinue oral Cardizem in view of poor ejection fraction Rate controlled with oral metoprolol Not a candidate for anticoagulation  -Tobacco abuse On nicotine patch  -DVT prophylaxis with sequential compression device to lower extremities  -Overall long term prognosis poor Family aware of medical condition and treatment plan  Palliative medicine consult appreciated  -Ambulatory dysfunction physical deconditioning Physical therapy to continue Social worker follow-up for placement  All the records are reviewed and case discussed with Care Management/Social Worker. Management plans discussed with the patient, family and they are in agreement.  CODE STATUS: Full code  DVT Prophylaxis: SCDs  TOTAL TIME TAKING CARE OF THIS PATIENT: 25 minutes.   POSSIBLE D/C IN 2 to 3 DAYS, DEPENDING ON CLINICAL CONDITION.  [name] M.D on 11/18/2018 at 11:44 AM  Between 7am to 6pm - Pager - [phone]  After 6pm go to [email or web address] - password [email or web address] [facility]  Office  [phone]  CC: Primary care physician; Alwyn Pea, NP  Note: This dictation was prepared with Dragon dictation along with smaller phrase technology. Any transcriptional errors that result from this process are unintentional.

## 2018-11-19 MED ORDER — CIPROFLOXACIN HCL 500 MG PO TABS
500.0000 mg | ORAL_TABLET | Freq: Every day | ORAL | Status: DC
  Administered 2018-11-19 – 2018-11-20 (×2): 500 mg via ORAL
  Filled 2018-11-19 (×2): qty 1

## 2018-11-19 NOTE — Progress Notes (Signed)
[facility] [place] NAME: [name]    MR#:  [id number]  DATE OF BIRTH:  22-Sep-1956  SUBJECTIVE:  CHIEF COMPLAINT:   Chief Complaint  Patient presents with  . Altered Mental Status  Patient seen today Has periods of confusion Unable to stand up and bear weight Needs 3 people assistance to get up out of the bed Medicaid has been denied Has insurance problem REVIEW OF SYSTEMS:    ROS Review of Systems  Constitutional: Generalized weakness HENT: Negative.   Eyes: Negative.   Respiratory: Negative.   Cardiovascular: Negative.   Gastrointestinal: Has decreased abdominal distention Genitourinary: Negative.   Musculoskeletal: Negative.   Skin: Left upper extremity swelling  Neurological: Not completely oriented to time place and person Endo/Heme/Allergies: Negative.   Psychiatric/Behavioral: periods of confusion All other systems reviewed and are negative.  DRUG ALLERGIES:  No Known Allergies  VITALS:  Blood pressure 96/60, pulse 78, temperature 98.6 F (37 C), temperature source Oral, resp. rate 16, height 5\' 9"  (1.753 m), weight 65.3 kg, SpO2 100 %.  PHYSICAL EXAMINATION:   Physical Exam  GENERAL:  62 y.o.-year-old patient lying in the bed  EYES: Pupils equal, round, reactive to light and accommodation. No scleral icterus. Extraocular muscles intact.  HEENT: Head atraumatic, normocephalic. Oropharynx and nasopharynx clear.  NECK:  Supple, no jugular venous distention. No thyroid enlargement, no tenderness.  LUNGS: Normal breath sounds bilaterally, no wheezing, rales, rhonchi. No use of accessory muscles of respiration.  CARDIOVASCULAR: S1, S2 normal. No murmurs, rubs, or gallops.  ABDOMEN: Soft, nontender, has decreased distention. Bowel sounds present. No organomegaly or mass.  EXTREMITIES: No cyanosis, clubbing  1 plus edema b/l.    Left upper extremity swelling  NEUROLOGIC: Cranial nerves II through XII are intact. No  focal Motor or sensory deficits b/l.   PSYCHIATRIC: Oriented to self and place SKIN: No obvious rash, lesion, or ulcer.   LABORATORY PANEL:   CBC Recent Labs  Lab 11/13/18 0321  WBC 4.1  HGB 9.5*  HCT 28.2*  PLT 233   ------------------------------------------------------------------------------------------------------------------ Chemistries  Recent Labs  Lab 11/13/18 0321  11/15/18 0500 11/16/18 0527  NA 138   < > 141 137  K 2.7*   < > 3.3* 3.5  CL 104   < > 104 106  CO2 25   < > 24 23  GLUCOSE 133*   < > 108* 105*  BUN 34*   < > 29* 27*  CREATININE 2.38*   < > 2.45* 2.32*  CALCIUM 8.7*   < > 9.5 8.8*  MG  --    < > 2.1  --   AST 26  --   --   --   ALT 11  --   --   --   ALKPHOS 44  --   --   --   BILITOT 1.1  --   --   --    < > = values in this interval not displayed.   ------------------------------------------------------------------------------------------------------------------  Cardiac Enzymes No results for input(s): TROPONINI in the last 168 hours. ------------------------------------------------------------------------------------------------------------------  RADIOLOGY:  No results found.   ASSESSMENT AND PLAN:  62 year old female patient with history of tobacco abuse, alcohol abuse, liver cirrhosis, atrial fibrillation currently under hospitalist service for altered mental status and abdominal distention  -Decompensated liver cirrhosis with  ascites Status post paracentesis twice in this hospitalization with 10 L of fluid being removed Appreciate GI follow-up On octreotide, albumin and midodrine  Continue Xifaxan  -Acute encephalopathy Mental status waxes and wanes Possibly Wernicke's from chronic alcohol use Thiamine supplementation  orally Multifactorial in etiology Continue oral lactulose to avoid hepatic encephalopathy Completed IV antibiotics therapy this hospitalization  -Severe ambulatory dysfunction Not much progress so  far Aggressive physical therapy to continue Social worker f/u for any possibility of placement  -Left upper extremity edema resolving Secondary to liver disease probably anasarca No evidence of DVT on Doppler venous ultrasound Continue Aldactone for diuresis  -Hyponatremia improved Appreciate nephrology follow-up Sodium tablets deferred to avoid worsening of ascites  -Metabolic acidosis improved off IV sodium bicarbonate fluids  -Hepatorenal syndrome Stable No acute intervention  -Chronic atrial fibrillation Discontinue oral Cardizem in view of poor ejection fraction Rate controlled with oral metoprolol Not a candidate for anticoagulation  -Tobacco abuse On nicotine patch  -DVT prophylaxis with sequential compression device to lower extremities  -Overall long term prognosis poor Family aware of medical condition and treatment plan  Palliative medicine consult appreciated  All the records are reviewed and case discussed with Care Management/Social Worker. Management plans discussed with the patient, family and they are in agreement.  CODE STATUS: Full code  DVT Prophylaxis: SCDs  TOTAL TIME TAKING CARE OF THIS PATIENT: 25 minutes.   POSSIBLE D/C IN 1 to 2 DAYS, DEPENDING ON CLINICAL CONDITION.  [name] M.D on 11/19/2018 at 12:41 PM  Between 7am to 6pm - Pager - [phone]  After 6pm go to [email or web address] - password [email or web address] [facility]  Office  [phone]  CC: Primary care physician; Alwyn Pea, NP  Note: This dictation was prepared with Dragon dictation along with smaller phrase technology. Any transcriptional errors that result from this process are unintentional.

## 2018-11-19 NOTE — Progress Notes (Signed)
[facility] Inpatient Progress Note  Subjective: Patient seen for f/u encephalopathy, diuretic-resistant ascites, unresolving renal failure.  Patient in  NAD, alert, pleasantly confused. Not agitated.   Objective: Vital signs in last 24 hours: Temp:  [97.6 F (36.4 C)-98.6 F (37 C)] 98.6 F (37 C) (12/01 1207) Pulse Rate:  [65-78] 78 (12/01 1207) Resp:  [16-20] 16 (12/01 1207) BP: (96-102)/(60-77) 96/60 (12/01 1207) SpO2:  [97 %-100 %] 100 % (12/01 1207) Blood pressure 96/60, pulse 78, temperature 98.6 F (37 C), temperature source Oral, resp. rate 16, height 5\' 9"  (1.753 m), weight 65.3 kg, SpO2 100 %.    Intake/Output from previous day: 11/30 0701 - 12/01 0700 In: 448.4 [P.O.:360; IV Piggyback:88.4] Out: 400 [Urine:400]  Intake/Output this shift: Total I/O In: 240 [P.O.:240] Out: -    General appearance:  Alert, NAD. Confused. Resp: CTA, no wheezes. Cardio: irreg irreg, no gallop GI: Distended with fluid wave. BS+ Extremities:  No edema.   Lab Results: No results found for this or any previous visit (from the past 24 hour(s)).   No results for input(s): WBC, HGB, HCT, PLT in the last 72 hours. BMET No results for input(s): NA, K, CL, CO2, GLUCOSE, BUN, CREATININE, CALCIUM in the last 72 hours. LFT No results for input(s): PROT, ALBUMIN, AST, ALT, ALKPHOS, BILITOT, BILIDIR, IBILI in the last 72 hours. PT/INR No results for input(s): LABPROT, INR in the last 72 hours. Hepatitis Panel No results for input(s): HEPBSAG, HCVAB, HEPAIGM, HEPBIGM in the last 72 hours. C-Diff No results for input(s): CDIFFTOX in the last 72 hours. No results for input(s): CDIFFPCR in the last 72 hours.   Studies/Results: No results found.  Scheduled Inpatient Medications:   . ciprofloxacin  500 mg Oral Q breakfast  . feeding supplement (ENSURE ENLIVE)  237 mL Oral BID BM  . folic acid  1 mg Oral Daily  . lactulose  30 g Oral TID  . metoprolol tartrate  25  mg Oral BID  . midodrine  5 mg Oral TID WC  . multivitamin with minerals  1 tablet Oral Daily  . nicotine  21 mg Transdermal Daily  . octreotide  100 mcg Subcutaneous TID  . rifaximin  550 mg Oral BID  . spironolactone  25 mg Oral Daily  . thiamine  100 mg Oral Daily  . venlafaxine  37.5 mg Oral BID WC  . vitamin C  250 mg Oral BID    Continuous Inpatient Infusions:   . sodium chloride Stopped (11/15/18 1607)    PRN Inpatient Medications:  sodium chloride, acetaminophen **OR** acetaminophen, guaiFENesin-dextromethorphan, metoprolol tartrate, ondansetron **OR** ondansetron (ZOFRAN) IV, senna-docusate  Assessment:  1. Diuretic-resistant ascites - On midodrine, albumin. Serial paracenteses are necessary.   2. Wernicke Korsakoff psychosis.  3. Renal insufficiency.  4. Hepatorenal, possibly type II.  Plan:  1. Continue serial paracentesis only as often as necessary. Theoretically higher risk of peritonitis with more frequent paracenteses. 2. Cornerstone of therapy for now will be Na restriction and possibly fluid restriction. 3. May need to consider outpatient TIPS to control ascites. AMS from [place] psychosis is  likely going go to be chronic, so the 30% risk of encephalopathy from TIPS is not likely going to affect her clinical status.    Discharge planning difficult, due to patient lack of insurance and medicaid coverage being denied.   Dr. Marius Ditch will see tomorrow for [facility].     Sophira Rumler K. Alice Reichert, M.D. 11/19/2018, 1:18 PM

## 2018-11-19 NOTE — Progress Notes (Signed)
[facility]  ROUNDING NOTE   Subjective:   Patient is more alert today No complaints of shortness of breath Remains confused and disriented  Objective:  Vital signs in last 24 hours:  Temp:  [97.6 F (36.4 C)-97.8 F (36.6 C)] 97.6 F (36.4 C) (12/01 0550) Pulse Rate:  [65-75] 65 (12/01 0550) Resp:  [18-20] 20 (12/01 0550) BP: (102-117)/(72-84) 102/72 (12/01 0550) SpO2:  [97 %-99 %] 97 % (12/01 0550)  Weight change:  Filed Weights   11/09/18 1321  Weight: 65.3 kg    Intake/Output: I/O last 3 completed shifts: In: 525.5 [P.O.:360; IV Piggyback:165.5] Out: 600 [Urine:600]   Intake/Output this shift:  Total I/O In: 240 [P.O.:240] Out: -   Physical Exam: General: NAD,   Head/ENT Normocephalic, atraumatic. Moist oral mucosal membranes  Neck: Supple, trachea midline  Lungs:  Clear to auscultation  Heart: Regular rate and rhythm, 2/6 murmur  Abdomen:  + ascites  Extremities:  trace peripheral edema.  Neurologic:  Alert, able to answer questions appropriately        Basic Metabolic Panel: Recent Labs  Lab 11/13/18 0321 11/14/18 0526 11/15/18 0500 11/16/18 0527  NA 138 140 141 137  K 2.7* 2.8* 3.3* 3.5  CL 104 103 104 106  CO2 25 24 24 23   GLUCOSE 133* 116* 108* 105*  BUN 34* 30* 29* 27*  CREATININE 2.38* 2.54* 2.45* 2.32*  CALCIUM 8.7* 9.4 9.5 8.8*  MG  --  1.6* 2.1  --   PHOS  --  2.7  --   --     Liver Function Tests: Recent Labs  Lab 11/13/18 0321  AST 26  ALT 11  ALKPHOS 44  BILITOT 1.1  PROT 4.9*  ALBUMIN 3.3*   No results for input(s): LIPASE, AMYLASE in the last 168 hours. No results for input(s): AMMONIA in the last 168 hours.  CBC: Recent Labs  Lab 11/13/18 0321  WBC 4.1  HGB 9.5*  HCT 28.2*  MCV 98.3  PLT 233    Cardiac Enzymes: No results for input(s): CKTOTAL, CKMB, CKMBINDEX, TROPONINI in the last 168 hours.  BNP: Invalid input(s): POCBNP  CBG: Recent Labs  Lab 11/16/18 0749  GLUCAP 103*     Microbiology: Results for orders placed or performed during the hospital encounter of 11/09/18  Body fluid culture     Status: None   Collection Time: 11/10/18 12:19 PM  Result Value Ref Range Status   Specimen Description   Final    PERITONEAL Performed at [facility], [address], [place], [place] [address]    Special Requests   Final    NONE Performed at [facility], [address] [place], [place] [address]    Gram Stain   Final    WBC PRESENT,BOTH PMN AND MONONUCLEAR NO ORGANISMS SEEN CYTOSPIN SMEAR    Culture   Final    NO GROWTH 3 DAYS Performed at [facility], [place] [address] [place], [place] [address]    Report Status 11/13/2018 FINAL  Final  Fungus Culture With Stain     Status: None (Preliminary result)   Collection Time: 11/10/18 12:19 PM  Result Value Ref Range Status   Fungus Stain Final report  Final    Comment: (NOTE) Performed At: [facility] [place], [place] [id number] Rush Farmer MD GX:[id number]    Fungus (Mycology) Culture PENDING  Incomplete   Fungal Source PERITONEAL  Final    Comment: Performed at [facility], [address] [place], [place] [address]  Fungus Culture Result     Status: None   Collection Time: 11/10/18 12:19 PM  Result Value Ref Range Status   Result 1 Comment  Final    Comment: (NOTE) KOH/Calcofluor preparation:  no fungus observed. Performed At: [facility] [place], [place] [id number] Rush Farmer MD YT:[id number]   Urine Culture     Status: Abnormal   Collection Time: 11/10/18  4:28 PM  Result Value Ref Range Status   Specimen Description   Final    URINE, RANDOM Performed at [facility], [address] [place], [place] [address]    Special Requests   Final    NONE Performed at [facility], [place], [place] [address]    Culture 10,000 COLONIES/mL YEAST (A)  Final   Report  Status 11/12/2018 FINAL  Final    Coagulation Studies: No results for input(s): LABPROT, INR in the last 72 hours.  Urinalysis: No results for input(s): COLORURINE, LABSPEC, PHURINE, GLUCOSEU, HGBUR, BILIRUBINUR, KETONESUR, PROTEINUR, UROBILINOGEN, NITRITE, LEUKOCYTESUR in the last 72 hours.  Invalid input(s): APPERANCEUR    Imaging: No results found.   Medications:   . sodium chloride Stopped (11/15/18 1607)  . albumin human Stopped (11/18/18 2327)   . feeding supplement (ENSURE ENLIVE)  237 mL Oral BID BM  . folic acid  1 mg Oral Daily  . lactulose  30 g Oral TID  . metoprolol tartrate  25 mg Oral BID  . midodrine  5 mg Oral TID WC  . multivitamin with minerals  1 tablet Oral Daily  . nicotine  21 mg Transdermal Daily  . octreotide  100 mcg Subcutaneous TID  . rifaximin  550 mg Oral BID  . spironolactone  25 mg Oral Daily  . thiamine  100 mg Oral Daily  . venlafaxine  37.5 mg Oral BID WC  . vitamin C  250 mg Oral BID   sodium chloride, acetaminophen **OR** acetaminophen, guaiFENesin-dextromethorphan, metoprolol tartrate, ondansetron **OR** ondansetron (ZOFRAN) IV, senna-docusate  Assessment/ Plan:  [name] is a 62 y.o. white female [name] is a 62 y.o. white female with hepatic cirrhosis, atrial fibrillation, EtOH, depression, who was admitted to [facility] on 11/09/2018 for Hyponatremia and acute renal failure  1. Hyponatremia: secondary to hepatic cirrhosis.  - Avoid IV fluids  - Na corrected  2. Acute Renal Failure:  Secondary to hemodynamic changes Baseline creatinine of 0.88 on 10/29/18  - d/c albumin - Continue midodrine - Continue octreotide.   3. Hypokalemia -Continue spironolactone -Potassium level is now corrected  4. Edema, cirrhosis and ascites, hepatic encephalopathy status post large volume paracentesis on 11/22 and 11/27 5+5 liters removed.  Management as per GI/internal medicine teams     LOS: [place] 12/1/201910:26 AM

## 2018-11-19 NOTE — Progress Notes (Signed)
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 Estimated Creatinine Clearance: 25.9 mL/min (A) (by C-G formula based on SCr of 2.32 mg/dL (H)).   Medical History: Past Medical History:  Diagnosis Date  . Cancer ([place])    skin  . Depression   . ETOH abuse     Medications:  Scheduled:  . feeding supplement (ENSURE ENLIVE)  237 mL Oral BID BM  . folic acid  1 mg Oral Daily  . lactulose  30 g Oral TID  . metoprolol tartrate  25 mg Oral BID  . midodrine  5 mg Oral TID WC  . multivitamin with minerals  1 tablet Oral Daily  . nicotine  21 mg Transdermal Daily  . octreotide  100 mcg Subcutaneous TID  . rifaximin  550 mg Oral BID  . spironolactone  25 mg Oral Daily  . thiamine  100 mg Oral Daily  . venlafaxine  37.5 mg Oral BID WC  . vitamin C  250 mg Oral BID   Infusions:  . sodium chloride Stopped (11/15/18 1607)  . albumin human Stopped (11/18/18 2327)   PRN: sodium chloride, acetaminophen **OR** acetaminophen, guaiFENesin-dextromethorphan, metoprolol tartrate, ondansetron **OR** ondansetron (ZOFRAN) IV, senna-docusate  Assessment: 62 year old female patient with history of tobacco abuse, alcohol abuse, liver cirrhosis, atrial fibrillation currently under hospitalist service for altered mental status and abdominal distention.  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 Patient also has edema and ascites will limited IV fluids. Doses of thiamine > 100 mg need to be given by slow infusion.   Plan:  Patient has been converted to oral thiamine. Pharmacy will sign off. Please re-consult if further assistance is desired.   Laural Benes, PharmD, BCPS Clinical  Pharmacist 11/19/2018 11:02 AM

## 2018-11-20 LAB — BASIC METABOLIC PANEL
ANION GAP: 8 (ref 5–15)
BUN: 20 mg/dL (ref 8–23)
CALCIUM: 9.1 mg/dL (ref 8.9–10.3)
CO2: 24 mmol/L (ref 22–32)
Chloride: 105 mmol/L (ref 98–111)
Creatinine, Ser: 1.98 mg/dL — ABNORMAL HIGH (ref 0.44–1.00)
GFR calc Af Amer: 31 mL/min — ABNORMAL LOW (ref >60)
GFR calc non Af Amer: 26 mL/min — ABNORMAL LOW (ref >60)
GLUCOSE: 100 mg/dL — AB (ref 70–99)
Potassium: 4 mmol/L (ref 3.5–5.1)
Sodium: 137 mmol/L (ref 135–145)

## 2018-11-20 MED ORDER — METOPROLOL TARTRATE 25 MG PO TABS: 25.0000 mg | ORAL_TABLET | Freq: Two times a day (BID) | ORAL | 0 refills | Status: DC

## 2018-11-20 MED ORDER — FOLIC ACID 1 MG PO TABS: 1.0000 mg | ORAL_TABLET | Freq: Every day | ORAL | 0 refills | Status: DC

## 2018-11-20 MED ORDER — THIAMINE HCL 100 MG PO TABS: 100.0000 mg | ORAL_TABLET | Freq: Every day | ORAL | 0 refills | Status: DC

## 2018-11-20 MED ORDER — SPIRONOLACTONE 25 MG PO TABS: 25.0000 mg | ORAL_TABLET | Freq: Every day | ORAL | 0 refills | Status: DC

## 2018-11-20 MED ORDER — RIFAXIMIN 550 MG PO TABS: 550.0000 mg | ORAL_TABLET | Freq: Two times a day (BID) | ORAL | 0 refills | Status: DC

## 2018-11-20 MED ORDER — SENNOSIDES-DOCUSATE SODIUM 8.6-50 MG PO TABS: 1.0000 | ORAL_TABLET | Freq: Every evening | ORAL | 0 refills | Status: DC | PRN

## 2018-11-20 MED ORDER — ASCORBIC ACID 250 MG PO TABS: 250.0000 mg | ORAL_TABLET | Freq: Two times a day (BID) | ORAL | 0 refills | Status: DC

## 2018-11-20 MED ORDER — MIDODRINE HCL 5 MG PO TABS: 5.0000 mg | ORAL_TABLET | Freq: Three times a day (TID) | ORAL | 0 refills | Status: DC

## 2018-11-20 MED ORDER — LACTULOSE 10 GM/15ML PO SOLN: 30.0000 g | Freq: Three times a day (TID) | ORAL | 0 refills | Status: DC

## 2018-11-20 MED ORDER — ADULT MULTIVITAMIN W/MINERALS CH: 1.0000 | ORAL_TABLET | Freq: Every day | ORAL | 0 refills | Status: DC

## 2018-11-20 MED ORDER — CIPROFLOXACIN HCL 500 MG PO TABS: 500.0000 mg | ORAL_TABLET | Freq: Every day | ORAL | 0 refills | Status: DC

## 2018-11-20 MED ORDER — ENSURE ENLIVE PO LIQD: 237.0000 mL | Freq: Two times a day (BID) | ORAL | 0 refills | Status: DC

## 2018-11-20 NOTE — Care Management (Addendum)
RNCM checking with Corene Cornea with [facility] to see if they can take patient for home health. Per previous RNCM note 11/10/18: RNCM contacted [employer].  Per clinic patient is active and PCP is Lelon Huh, last seen on 09/04/18. Also, it looks like home palliative has also been recommended if patient will allow.  I expect patient will need EMS to home.

## 2018-11-20 NOTE — Progress Notes (Signed)
New referral for outpatient Palliative to follow at home received from [facility]. Patient discharged home today with [facility]. Notes faxed to referral. Flo Shanks RN, BSN, [facility] [place], hospital Liaison [phone]

## 2018-11-20 NOTE — Progress Notes (Signed)
Nsg Discharge Note  Admit Date:  11/09/2018 Discharge date: 11/20/2018   Elson Clan Schlosser to be D/C'd Home per MD order.  AVS completed.  Copy for chart, and copy for patient signed, and dated. Patient/caregiver able to verbalize understanding.  Discharge Medication: Allergies as of 11/20/2018   No Known Allergies     Medication List    STOP taking these medications   amiodarone 200 MG tablet Commonly known as:  PACERONE   apixaban 5 MG Tabs tablet Commonly known as:  ELIQUIS   diltiazem 120 MG 24 hr capsule Commonly known as:  CARDIZEM CD   sodium chloride 1 g tablet   traZODone 100 MG tablet Commonly known as:  DESYREL   venlafaxine 37.5 MG tablet Commonly known as:  EFFEXOR   venlafaxine 75 MG tablet Commonly known as:  EFFEXOR     TAKE these medications   ascorbic acid 250 MG tablet Commonly known as:  VITAMIN C Take 1 tablet (250 mg total) by mouth 2 (two) times daily.   ciprofloxacin 500 MG tablet Commonly known as:  CIPRO Take 1 tablet (500 mg total) by mouth daily with breakfast for 14 days. Start taking on:  11/21/2018   feeding supplement (ENSURE ENLIVE) Liqd Take 237 mLs by mouth 2 (two) times daily between meals.   folic acid 1 MG tablet Commonly known as:  FOLVITE Take 1 tablet (1 mg total) by mouth daily. Start taking on:  11/21/2018   lactulose 10 GM/15ML solution Commonly known as:  CHRONULAC Take 45 mLs (30 g total) by mouth 3 (three) times daily.   metoprolol tartrate 25 MG tablet Commonly known as:  LOPRESSOR Take 1 tablet (25 mg total) by mouth 2 (two) times daily.   midodrine 5 MG tablet Commonly known as:  PROAMATINE Take 1 tablet (5 mg total) by mouth 3 (three) times daily with meals.   multivitamin with minerals Tabs tablet Take 1 tablet by mouth daily.   rifaximin 550 MG Tabs tablet Commonly known as:  XIFAXAN Take 1 tablet (550 mg total) by mouth 2 (two) times daily.   senna-docusate 8.6-50 MG tablet Commonly known as:   Senokot-S Take 1 tablet by mouth at bedtime as needed for mild constipation.   spironolactone 25 MG tablet Commonly known as:  ALDACTONE Take 1 tablet (25 mg total) by mouth daily. Start taking on:  11/21/2018   thiamine 100 MG tablet Take 1 tablet (100 mg total) by mouth daily.       Discharge Assessment: Vitals:   11/20/18 0000 11/20/18 0535  BP: (!) 118/94 92/68  Pulse: 80 72  Resp:  20  Temp:  98.3 F (36.8 C)  SpO2:  100%   Skin clean, dry and intact without evidence of skin break down, no evidence of skin tears noted. IV catheter discontinued intact. Site without signs and symptoms of complications - no redness or edema noted at insertion site, patient denies c/o pain - only slight tenderness at site.  Dressing with slight pressure applied.  D/c Instructions-Education: Discharge instructions given to patient/family with verbalized understanding. D/c education completed with patient/family including follow up instructions, medication list, d/c activities limitations if indicated, with other d/c instructions as indicated by MD - patient able to verbalize understanding, all questions fully answered. Patient instructed to return to ED, call 911, or call MD for any changes in condition.  Patient escorted via [place], and D/C home via private auto.  Eda Keys, RN 11/20/2018 1:08 PM

## 2018-11-20 NOTE — Care Management (Addendum)
RNCM spoke with patient regarding home health. She asked that I call Joaquim Lai [phone] however he does not stay with her at the hotel.  She is not sure if she will need EMS. She states she just returned from [facility] where she states she was there for 6 weeks.  I spoke with Mr Rudell and he requests EMS to hotel. He states that he does live with her at the hotel. [facility]ne Cornea with [facility] notified of patient discharge today EMS packet complete.

## 2018-11-20 NOTE — Progress Notes (Signed)
Called and left message for pt's husband per the phone number in the chart. Awaiting callback to confirm someone will be home for EMS to return pt home.

## 2018-11-20 NOTE — Progress Notes (Signed)
Pt's scripts sent to wrong place. Paged and used epic internal message to notify Dr Estanislado Pandy of need to resent scripts to [facility]. Awaiting response in order to discharge pt.

## 2018-11-20 NOTE — Progress Notes (Signed)
[facility]  ROUNDING NOTE   Subjective:  Patient awake and alert. Following commands.   Objective:  Vital signs in last 24 hours:  Temp:  [97.7 F (36.5 C)-98.3 F (36.8 C)] 98.3 F (36.8 C) (12/02 0535) Pulse Rate:  [72-80] 72 (12/02 0535) Resp:  [20] 20 (12/02 0535) BP: (92-118)/(68-94) 92/68 (12/02 0535) SpO2:  [100 %] 100 % (12/02 0535)  Weight change:  Filed Weights   11/09/18 1321  Weight: 65.3 kg    Intake/Output: I/O last 3 completed shifts: In: 539.3 [P.O.:480; IV Piggyback:59.3] Out: 500 [Urine:500]   Intake/Output this shift:  Total I/O In: 120 [P.O.:120] Out: -   Physical Exam: General: NAD  Head/ENT Normocephalic, atraumatic. Moist oral mucosal membranes  Neck: Supple, trachea midline  Lungs:  Clear to auscultation  Heart: Regular rate and rhythm, 2/6 murmur  Abdomen:  + ascites  Extremities: trace peripheral edema.  Neurologic: Alert, able to answer questions appropriately        Basic Metabolic Panel: Recent Labs  Lab 11/14/18 0526 11/15/18 0500 11/16/18 0527 11/20/18 0542  NA 140 141 137 137  K 2.8* 3.3* 3.5 4.0  CL 103 104 106 105  CO2 24 24 23 24   GLUCOSE 116* 108* 105* 100*  BUN 30* 29* 27* 20  CREATININE 2.54* 2.45* 2.32* 1.98*  CALCIUM 9.4 9.5 8.8* 9.1  MG 1.6* 2.1  --   --   PHOS 2.7  --   --   --     Liver Function Tests: No results for input(s): AST, ALT, ALKPHOS, BILITOT, PROT, ALBUMIN in the last 168 hours. No results for input(s): LIPASE, AMYLASE in the last 168 hours. No results for input(s): AMMONIA in the last 168 hours.  CBC: No results for input(s): WBC, NEUTROABS, HGB, HCT, MCV, PLT in the last 168 hours.  Cardiac Enzymes: No results for input(s): CKTOTAL, CKMB, CKMBINDEX, TROPONINI in the last 168 hours.  BNP: Invalid input(s): POCBNP  CBG: Recent Labs  Lab 11/16/18 0749  GLUCAP 103*    Microbiology: Results for orders placed or performed during the hospital encounter of 11/09/18   Body fluid culture     Status: None   Collection Time: 11/10/18 12:19 PM  Result Value Ref Range Status   Specimen Description   Final    PERITONEAL Performed at [facility], [address], [place], [place] [address]    Special Requests   Final    NONE Performed at [facility], [address] [place], [place] [address]    Gram Stain   Final    WBC PRESENT,BOTH PMN AND MONONUCLEAR NO ORGANISMS SEEN CYTOSPIN SMEAR    Culture   Final    NO GROWTH 3 DAYS Performed at [facility], [place] [address], [place], [place] [address]    Report Status 11/13/2018 FINAL  Final  Fungus Culture With Stain     Status: None (Preliminary result)   Collection Time: 11/10/18 12:19 PM  Result Value Ref Range Status   Fungus Stain Final report  Final    Comment: (NOTE) Performed At: [facility] [place], [place] [id number] Rush Farmer MD [id number]    Fungus (Mycology) Culture PENDING  Incomplete   Fungal Source PERITONEAL  Final    Comment: Performed at [facility], [address] [place], [place] [address]  Fungus Culture Result     Status: None   Collection Time: 11/10/18 12:19 PM  Result Value Ref Range Status  Result 1 Comment  Final    Comment: (NOTE) KOH/Calcofluor preparation:  no fungus observed. Performed At: [facility] [place], [place] [id number] Rush Farmer MD PJ:[id number]   Urine Culture     Status: Abnormal   Collection Time: 11/10/18  4:28 PM  Result Value Ref Range Status   Specimen Description   Final    URINE, RANDOM Performed at [facility], [address] [place], [place] [address]    Special Requests   Final    NONE Performed at [facility], [place], [place] [address]    Culture 10,000 COLONIES/mL YEAST (A)  Final   Report Status 11/12/2018 FINAL  Final    Coagulation Studies: No results for input(s): LABPROT, INR in the  last 72 hours.  Urinalysis: No results for input(s): COLORURINE, LABSPEC, PHURINE, GLUCOSEU, HGBUR, BILIRUBINUR, KETONESUR, PROTEINUR, UROBILINOGEN, NITRITE, LEUKOCYTESUR in the last 72 hours.  Invalid input(s): APPERANCEUR    Imaging: No results found.   Medications:   . sodium chloride Stopped (11/15/18 1607)   . ciprofloxacin  500 mg Oral Q breakfast  . feeding supplement (ENSURE ENLIVE)  237 mL Oral BID BM  . folic acid  1 mg Oral Daily  . lactulose  30 g Oral TID  . metoprolol tartrate  25 mg Oral BID  . midodrine  5 mg Oral TID WC  . multivitamin with minerals  1 tablet Oral Daily  . nicotine  21 mg Transdermal Daily  . octreotide  100 mcg Subcutaneous TID  . rifaximin  550 mg Oral BID  . spironolactone  25 mg Oral Daily  . thiamine  100 mg Oral Daily  . venlafaxine  37.5 mg Oral BID WC  . vitamin C  250 mg Oral BID   sodium chloride, acetaminophen **OR** acetaminophen, guaiFENesin-dextromethorphan, metoprolol tartrate, ondansetron **OR** ondansetron (ZOFRAN) IV, senna-docusate  Assessment/ Plan:  Ms. Taylyn Brame is a 62 y.o. white female Ms. Chelisa Hennen is a 62 y.o. white female with hepatic cirrhosis, atrial fibrillation, EtOH, depression, who was admitted to [facility] on 11/09/2018 for Hyponatremia and acute renal failure  1. Hyponatremia: secondary to hepatic cirrhosis.  - serum sodium remains normal at 140.  Continue fluid restriction of 1.2 L per day.  2. Acute Renal Failure:  Secondary to hemodynamic changes Creatinine now down to 1.98and has been improving over the past several days.  Continue to monitor.  3. Hypokalemia -otassium up to 4.0.  Maintain the patient on spironolactone.  4. Edema, cirrhosis and ascites, hepatic encephalopathy status post large volume paracentesis on 11/22 and 11/27 Continue spironolactone for now.     LOS: 11 Liyat Faulkenberry 12/2/201912:51 PM

## 2018-11-20 NOTE — Discharge Summary (Signed)
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 Cancer ([place])    skin  . Depression   . ETOH abuse      ADMITTING HISTORY [name]  is a 62 y.o. female with a known history of tobacco abuse, alcohol abuse, atrial fibrillation presented to the emergency room for confusion.  Patient is more awake and alert in the emergency room but has generalized weakness.  She also has some distended abdomen and with discomfort.  No complains of any vomiting of blood or blood in the stool.  Patient drinks alcohol on a regular basis and last drink was 2 days ago.  She also is an active smoker.  CT abdomen is pending.  She was worked up in the emergency room her she appears dry and dehydrated and in renal failure.  Hospitalist service is consulted for further care.   HOSPITAL COURSE:  Patient was admitted to telemetry.  She was evaluated by gastroenterology, nephrology during the hospitalization.  Patient underwent 2 large volume paracentesis and 10 L of fluid was removed.  She received IV albumin infusions during the stay in the hospital along with octreotide and midodrine.  Fluid restriction was maintained.  Her hyponatremia improved.  Salt tablets were also given but later on deferred due to worsening of ascites.  Patient  was treated for hepatorenal syndrome nephrology keenly follow the patient.  Creatinine has improved and come down to 1.98 at the time of discharge.  Patient received aggressive physical therapy during hospitalization.  She was evaluated by gastroenterology continued oral lactulose and Aldactone for diuresis and also she was put on oral ciprofloxacin antibiotic for peritonitis prophylaxis.  During the hospitalization patient was worked up with CT abdomen, CT head.  She underwent ultrasound-guided paracentesis twice.  Patient was switched to oral metoprolol for rate control from Cardizem because of poor ejection fraction on echocardiogram.  In view of her advanced liver disease anticoagulation deferred.  Tobacco cessation was counseled to the patient and nicotine patch was offered.  Metabolic acidosis also improved during the hospitalization and IV sodium bicarbonate fluids were stopped.  Overall patient's anasarca improved, ascites improved mental status improved with IV thiamine supplementation followed by oral thiamine supplementation for Wernicke's encephalopathy.  Patient also had left upper extremity edema which was worked up with the venous Doppler ultrasound which showed no DVT. Patient's family has been updated.  Patient will be discharged to home with home health services.  Needs to follow-up with gastroenterology and nephrology closely in the clinic and be compliant with medications and abstain from alcohol.  CONSULTS OBTAINED:  Treatment Team:  Lin Landsman, MD Jonathon Bellows, MD Lavonia Dana, MD  DRUG ALLERGIES:  No Known Allergies  DISCHARGE MEDICATIONS:   Allergies as of 11/20/2018   No Known Allergies     Medication List    STOP taking these medications   amiodarone 200 MG tablet Commonly known  as:  PACERONE   apixaban 5 MG Tabs tablet Commonly known as:  ELIQUIS   diltiazem 120 MG 24 hr capsule Commonly known as:  CARDIZEM CD   sodium chloride 1 g tablet    traZODone 100 MG tablet Commonly known as:  DESYREL   venlafaxine 37.5 MG tablet Commonly known as:  EFFEXOR   venlafaxine 75 MG tablet Commonly known as:  EFFEXOR     TAKE these medications   ascorbic acid 250 MG tablet Commonly known as:  VITAMIN C Take 1 tablet (250 mg total) by mouth 2 (two) times daily.   ciprofloxacin 500 MG tablet Commonly known as:  CIPRO Take 1 tablet (500 mg total) by mouth daily with breakfast for 14 days. Start taking on:  11/21/2018   feeding supplement (ENSURE ENLIVE) Liqd Take 237 mLs by mouth 2 (two) times daily between meals.   folic acid 1 MG tablet Commonly known as:  FOLVITE Take 1 tablet (1 mg total) by mouth daily. Start taking on:  11/21/2018   lactulose 10 GM/15ML solution Commonly known as:  CHRONULAC Take 45 mLs (30 g total) by mouth 3 (three) times daily.   metoprolol tartrate 25 MG tablet Commonly known as:  LOPRESSOR Take 1 tablet (25 mg total) by mouth 2 (two) times daily.   midodrine 5 MG tablet Commonly known as:  PROAMATINE Take 1 tablet (5 mg total) by mouth 3 (three) times daily with meals.   multivitamin with minerals Tabs tablet Take 1 tablet by mouth daily.   rifaximin 550 MG Tabs tablet Commonly known as:  XIFAXAN Take 1 tablet (550 mg total) by mouth 2 (two) times daily.   senna-docusate 8.6-50 MG tablet Commonly known as:  Senokot-S Take 1 tablet by mouth at bedtime as needed for mild constipation.   spironolactone 25 MG tablet Commonly known as:  ALDACTONE Take 1 tablet (25 mg total) by mouth daily. Start taking on:  11/21/2018   thiamine 100 MG tablet Take 1 tablet (100 mg total) by mouth daily.       Today  Patient seen and evaluated today Awake and responds to all verbal commands No fever No abdominal pain No shortness of breath VITAL SIGNS:  Blood pressure 92/68, pulse 72, temperature 98.3 F (36.8 C), temperature source Oral, resp. rate 20, height 5\' 9"  (1.753 m), weight 65.3 kg, SpO2  100 %.  I/O:    Intake/Output Summary (Last 24 hours) at 11/20/2018 1407 Last data filed at 11/20/2018 1300 Gross per 24 hour  Intake 360 ml  Output 150 ml  Net 210 ml    PHYSICAL EXAMINATION:  Physical Exam  GENERAL:  62 y.o.-year-old patient lying in the bed with no acute distress.  LUNGS: Normal breath sounds bilaterally, no wheezing, rales,rhonchi or crepitation. No use of accessory muscles of respiration.  CARDIOVASCULAR: S1, S2 normal. No murmurs, rubs, or gallops.  ABDOMEN: Soft, non-tender, non-distended. Bowel sounds present. No organomegaly or mass.  NEUROLOGIC: Moves all 4 extremities. PSYCHIATRIC: The patient is alert and oriented x 2.  SKIN: No obvious rash, lesion, or ulcer.   DATA REVIEW:   CBC No results for input(s): WBC, HGB, HCT, PLT in the last 168 hours.  Chemistries  Recent Labs  Lab 11/15/18 0500  11/20/18 0542  NA 141   < > 137  K 3.3*   < > 4.0  CL 104   < > 105  CO2 24   < > 24  GLUCOSE 108*   < > 100*  BUN 29*   < > 20  CREATININE 2.45*   < > 1.98*  CALCIUM 9.5   < > 9.1  MG 2.1  --   --    < > = values in this interval not displayed.    Cardiac Enzymes No results for input(s): TROPONINI in the last 168 hours.  Microbiology Results  Results for orders placed or performed during the hospital encounter of 11/09/18  Body fluid culture     Status: None   Collection Time: 11/10/18 12:19 PM  Result Value Ref Range Status   Specimen Description   Final    PERITONEAL Performed at [facility], [address] [place], [place] [address]    Special Requests   Final    NONE Performed at [facility], [address] [place], [place] [address]    Gram Stain   Final    WBC PRESENT,BOTH PMN AND MONONUCLEAR NO ORGANISMS SEEN CYTOSPIN SMEAR    Culture   Final    NO GROWTH 3 DAYS Performed at [facility], [place] [address] [place], [place] [address]    Report Status 11/13/2018 FINAL  Final  Fungus Culture With  Stain     Status: None (Preliminary result)   Collection Time: 11/10/18 12:19 PM  Result Value Ref Range Status   Fungus Stain Final report  Final    Comment: (NOTE) Performed At: [facility] [place], [place] [id number] Rush Farmer MD [id number]    Fungus (Mycology) Culture PENDING  Incomplete   Fungal Source PERITONEAL  Final    Comment: Performed at [facility], [address] [place], [place] [address]  Fungus Culture Result     Status: None   Collection Time: 11/10/18 12:19 PM  Result Value Ref Range Status   Result 1 Comment  Final    Comment: (NOTE) KOH/Calcofluor preparation:  no fungus observed. Performed At: [facility] [place], [place] [id number] Rush Farmer MD [id number]   Urine Culture     Status: Abnormal   Collection Time: 11/10/18  4:28 PM  Result Value Ref Range Status   Specimen Description   Final    URINE, RANDOM Performed at [facility], [address], [place], [place] [address]    Special Requests   Final    NONE Performed at [facility], [place], [place] [address]    Culture 10,000 COLONIES/mL YEAST (A)  Final   Report Status 11/12/2018 FINAL  Final    RADIOLOGY:  No results found.  Follow up with PCP in 1 week.  Management plans discussed with the patient, family and they are in agreement.  CODE STATUS: Full code    Code Status Orders  (From admission, onward)         Start     Ordered   11/09/18 1952  Full code  Continuous     11/09/18 1951        Code Status History    Date Active Date Inactive Code Status Order ID Comments User Context   10/25/2018 0251 10/29/2018 2045 Full Code [id number]  Lance Coon, MD Inpatient   05/19/2018 1958 05/21/2018 1712 Full Code [id number]  Fritzi Mandes, MD Inpatient   11/27/2017 0109 11/29/2017 1844 Full Code [id number]  Gorden Harms, MD Inpatient   09/26/2016 1313 09/27/2016 1802 Full Code  [id number]  Dustin Flock, MD ED      TOTAL TIME TAKING CARE OF THIS  PATIENT ON DAY OF DISCHARGE: more than 35 minutes.   [name] M.D on 11/20/2018 at 2:07 PM  Between 7am to 6pm - Pager - [phone]  After 6pm go to [email or web address] - password [email or web address] [place] [facility]  Office  [phone]  CC: Primary care physician; Alwyn Pea, NP  Note: This dictation was prepared with Dragon dictation along with smaller phrase technology. Any transcriptional errors that result from this process are unintentional.

## 2018-11-20 NOTE — Progress Notes (Signed)
Spoke with pt's husband. He confirmed he was home and able to receive pt. EMS called and pt ready to go. Discharge packet on chart.

## 2018-11-21 NOTE — Care Management (Signed)
Late entry.  Informed by [facility] that patient did not qualify for [facility].  Liaison spoke with patient's husband and he will "think about services." Notified [place] with palliative

## 2018-11-26 ENCOUNTER — Inpatient Hospital Stay
Admission: EM | Admit: 2018-11-26 | Discharge: 2018-11-29 | DRG: 641 | Disposition: A | Discharge status: Home Health | Payer: Self-pay | Attending: Internal Medicine | Admitting: Internal Medicine

## 2018-11-26 ENCOUNTER — Emergency Department: Payer: Self-pay

## 2018-11-26 ENCOUNTER — Other Ambulatory Visit: Payer: Self-pay

## 2018-11-26 DIAGNOSIS — N183 Chronic kidney disease, stage 3 (moderate): Secondary | ICD-10-CM | POA: Diagnosis present

## 2018-11-26 DIAGNOSIS — D649 Anemia, unspecified: Secondary | ICD-10-CM | POA: Diagnosis present

## 2018-11-26 DIAGNOSIS — F329 Major depressive disorder, single episode, unspecified: Secondary | ICD-10-CM | POA: Diagnosis present

## 2018-11-26 DIAGNOSIS — I482 Chronic atrial fibrillation, unspecified: Secondary | ICD-10-CM | POA: Diagnosis present

## 2018-11-26 DIAGNOSIS — E440 Moderate protein-calorie malnutrition: Secondary | ICD-10-CM | POA: Diagnosis present

## 2018-11-26 DIAGNOSIS — Z85828 Personal history of other malignant neoplasm of skin: Secondary | ICD-10-CM

## 2018-11-26 DIAGNOSIS — F17210 Nicotine dependence, cigarettes, uncomplicated: Secondary | ICD-10-CM | POA: Diagnosis present

## 2018-11-26 DIAGNOSIS — R4182 Altered mental status, unspecified: Secondary | ICD-10-CM

## 2018-11-26 DIAGNOSIS — Z515 Encounter for palliative care: Secondary | ICD-10-CM | POA: Diagnosis present

## 2018-11-26 DIAGNOSIS — G9349 Other encephalopathy: Secondary | ICD-10-CM | POA: Diagnosis present

## 2018-11-26 DIAGNOSIS — K7040 Alcoholic hepatic failure without coma: Secondary | ICD-10-CM | POA: Diagnosis present

## 2018-11-26 DIAGNOSIS — Z6821 Body mass index (BMI) 21.0-21.9, adult: Secondary | ICD-10-CM

## 2018-11-26 DIAGNOSIS — Z638 Other specified problems related to primary support group: Secondary | ICD-10-CM

## 2018-11-26 DIAGNOSIS — F1020 Alcohol dependence, uncomplicated: Secondary | ICD-10-CM | POA: Diagnosis present

## 2018-11-26 DIAGNOSIS — Z9114 Patient's other noncompliance with medication regimen: Secondary | ICD-10-CM

## 2018-11-26 DIAGNOSIS — N390 Urinary tract infection, site not specified: Secondary | ICD-10-CM | POA: Diagnosis present

## 2018-11-26 DIAGNOSIS — Z79899 Other long term (current) drug therapy: Secondary | ICD-10-CM

## 2018-11-26 DIAGNOSIS — E512 Wernicke's encephalopathy: Principal | ICD-10-CM | POA: Diagnosis present

## 2018-11-26 LAB — CBC WITH DIFFERENTIAL/PLATELET
ABS IMMATURE GRANULOCYTES: 0.04 10*3/uL (ref 0.00–0.07)
Basophils Absolute: 0.1 10*3/uL (ref 0.0–0.1)
Basophils Relative: 1 %
Eosinophils Absolute: 0 10*3/uL (ref 0.0–0.5)
Eosinophils Relative: 0 %
HCT: 35.4 % — ABNORMAL LOW (ref 36.0–46.0)
Hemoglobin: 11.8 g/dL — ABNORMAL LOW (ref 12.0–15.0)
Immature Granulocytes: 0 %
LYMPHS ABS: 3.1 10*3/uL (ref 0.7–4.0)
Lymphocytes Relative: 30 %
MCH: 32.4 pg (ref 26.0–34.0)
MCHC: 33.3 g/dL (ref 30.0–36.0)
MCV: 97.3 fL (ref 80.0–100.0)
Monocytes Absolute: 0.8 10*3/uL (ref 0.1–1.0)
Monocytes Relative: 8 %
Neutro Abs: 6.3 10*3/uL (ref 1.7–7.7)
Neutrophils Relative %: 61 %
Platelets: 319 10*3/uL (ref 150–400)
RBC: 3.64 MIL/uL — ABNORMAL LOW (ref 3.87–5.11)
RDW: 14.4 % (ref 11.5–15.5)
WBC: 10.3 10*3/uL (ref 4.0–10.5)
nRBC: 0 % (ref 0.0–0.2)

## 2018-11-26 LAB — URINALYSIS, COMPLETE (UACMP) WITH MICROSCOPIC
Bacteria, UA: NONE SEEN
Bilirubin Urine: NEGATIVE
Glucose, UA: NEGATIVE mg/dL
HGB URINE DIPSTICK: NEGATIVE
KETONES UR: NEGATIVE mg/dL
Nitrite: NEGATIVE
Protein, ur: NEGATIVE mg/dL
Specific Gravity, Urine: 1.013 (ref 1.005–1.030)
WBC, UA: >50 WBC/hpf — ABNORMAL HIGH (ref 0–5)
pH: 5 (ref 5.0–8.0)

## 2018-11-26 LAB — COMPREHENSIVE METABOLIC PANEL
ALT: 26 U/L (ref 0–44)
AST: 66 U/L — ABNORMAL HIGH (ref 15–41)
Albumin: 3.1 g/dL — ABNORMAL LOW (ref 3.5–5.0)
Alkaline Phosphatase: 98 U/L (ref 38–126)
Anion gap: 11 (ref 5–15)
BILIRUBIN TOTAL: 1.5 mg/dL — AB (ref 0.3–1.2)
BUN: 28 mg/dL — ABNORMAL HIGH (ref 8–23)
CO2: 23 mmol/L (ref 22–32)
Calcium: 8.6 mg/dL — ABNORMAL LOW (ref 8.9–10.3)
Chloride: 102 mmol/L (ref 98–111)
Creatinine, Ser: 1.69 mg/dL — ABNORMAL HIGH (ref 0.44–1.00)
GFR calc Af Amer: 37 mL/min — ABNORMAL LOW (ref >60)
GFR calc non Af Amer: 32 mL/min — ABNORMAL LOW (ref >60)
Glucose, Bld: 90 mg/dL (ref 70–99)
Potassium: 4.4 mmol/L (ref 3.5–5.1)
Sodium: 136 mmol/L (ref 135–145)
Total Protein: 6 g/dL — ABNORMAL LOW (ref 6.5–8.1)

## 2018-11-26 LAB — ETHANOL: Alcohol, Ethyl (B): 22 mg/dL — ABNORMAL HIGH (ref <10)

## 2018-11-26 LAB — PROTIME-INR
INR: 1.19
Prothrombin Time: 15 seconds (ref 11.4–15.2)

## 2018-11-26 LAB — LIPASE, BLOOD: Lipase: 29 U/L (ref 11–51)

## 2018-11-26 LAB — AMMONIA: Ammonia: 15 umol/L (ref 9–35)

## 2018-11-26 MED ORDER — METOPROLOL TARTRATE 25 MG PO TABS
25.0000 mg | ORAL_TABLET | Freq: Two times a day (BID) | ORAL | Status: DC
  Administered 2018-11-27 – 2018-11-28 (×3): 25 mg via ORAL
  Filled 2018-11-26 (×4): qty 1

## 2018-11-26 MED ORDER — SODIUM CHLORIDE 0.9% FLUSH
3.0000 mL | Freq: Two times a day (BID) | INTRAVENOUS | Status: DC
  Administered 2018-11-26 – 2018-11-28 (×4): 3 mL via INTRAVENOUS

## 2018-11-26 MED ORDER — SODIUM CHLORIDE 0.9 % IV SOLN
250.0000 mL | INTRAVENOUS | Status: DC | PRN
  Administered 2018-11-27: 250 mL via INTRAVENOUS

## 2018-11-26 MED ORDER — RIFAXIMIN 550 MG PO TABS
550.0000 mg | ORAL_TABLET | Freq: Two times a day (BID) | ORAL | Status: DC
  Administered 2018-11-26 – 2018-11-28 (×5): 550 mg via ORAL
  Filled 2018-11-26 (×7): qty 1

## 2018-11-26 MED ORDER — VITAMIN C 500 MG PO TABS
250.0000 mg | ORAL_TABLET | Freq: Two times a day (BID) | ORAL | Status: DC
  Administered 2018-11-26 – 2018-11-28 (×5): 250 mg via ORAL
  Filled 2018-11-26: qty 1
  Filled 2018-11-26: qty 0.5
  Filled 2018-11-26 (×5): qty 1

## 2018-11-26 MED ORDER — ADULT MULTIVITAMIN W/MINERALS CH
1.0000 | ORAL_TABLET | Freq: Every day | ORAL | Status: DC
  Administered 2018-11-26 – 2018-11-28 (×3): 1 via ORAL
  Filled 2018-11-26 (×4): qty 1

## 2018-11-26 MED ORDER — ONDANSETRON HCL 4 MG/2ML IJ SOLN
4.0000 mg | Freq: Four times a day (QID) | INTRAMUSCULAR | Status: DC | PRN
  Administered 2018-11-27: 4 mg via INTRAVENOUS
  Filled 2018-11-26: qty 2

## 2018-11-26 MED ORDER — NICOTINE 14 MG/24HR TD PT24
14.0000 mg | MEDICATED_PATCH | Freq: Every day | TRANSDERMAL | Status: DC
  Administered 2018-11-26 – 2018-11-28 (×3): 14 mg via TRANSDERMAL
  Filled 2018-11-26 (×4): qty 1

## 2018-11-26 MED ORDER — CIPROFLOXACIN HCL 500 MG PO TABS
500.0000 mg | ORAL_TABLET | Freq: Every day | ORAL | Status: DC
  Administered 2018-11-27 – 2018-11-28 (×2): 500 mg via ORAL
  Filled 2018-11-26 (×3): qty 1

## 2018-11-26 MED ORDER — LORAZEPAM 2 MG/ML IJ SOLN: 1.0000 mg | Freq: Four times a day (QID) | INTRAMUSCULAR | Status: DC | PRN

## 2018-11-26 MED ORDER — SODIUM CHLORIDE 0.9 % IV BOLUS: 1000.0000 mL | Freq: Once | INTRAVENOUS | Status: DC

## 2018-11-26 MED ORDER — MIDODRINE HCL 5 MG PO TABS
5.0000 mg | ORAL_TABLET | Freq: Three times a day (TID) | ORAL | Status: DC
  Administered 2018-11-27 – 2018-11-29 (×7): 5 mg via ORAL
  Filled 2018-11-26 (×7): qty 1

## 2018-11-26 MED ORDER — LORAZEPAM 1 MG PO TABS: 1.0000 mg | ORAL_TABLET | Freq: Four times a day (QID) | ORAL | Status: DC | PRN

## 2018-11-26 MED ORDER — SODIUM CHLORIDE 0.9 % IV SOLN
1.0000 g | Freq: Two times a day (BID) | INTRAVENOUS | Status: DC
  Administered 2018-11-27: 1 g via INTRAVENOUS
  Filled 2018-11-26 (×3): qty 1

## 2018-11-26 MED ORDER — SODIUM CHLORIDE 0.9 % IV SOLN
2.0000 g | Freq: Once | INTRAVENOUS | Status: AC
  Administered 2018-11-26: 2 g via INTRAVENOUS
  Filled 2018-11-26: qty 2

## 2018-11-26 MED ORDER — LACTULOSE 10 GM/15ML PO SOLN
30.0000 g | Freq: Three times a day (TID) | ORAL | Status: DC
  Administered 2018-11-26 – 2018-11-28 (×7): 30 g via ORAL
  Filled 2018-11-26 (×8): qty 60

## 2018-11-26 MED ORDER — FOLIC ACID 1 MG PO TABS
1.0000 mg | ORAL_TABLET | Freq: Every day | ORAL | Status: DC
  Administered 2018-11-26 – 2018-11-28 (×3): 1 mg via ORAL
  Filled 2018-11-26 (×4): qty 1

## 2018-11-26 MED ORDER — SODIUM CHLORIDE 0.9% FLUSH: 3.0000 mL | INTRAVENOUS | Status: DC | PRN

## 2018-11-26 MED ORDER — ENSURE ENLIVE PO LIQD
237.0000 mL | Freq: Two times a day (BID) | ORAL | Status: DC
  Administered 2018-11-27: 237 mL via ORAL

## 2018-11-26 MED ORDER — SODIUM CHLORIDE 0.9 % IV BOLUS
500.0000 mL | Freq: Once | INTRAVENOUS | Status: AC
  Administered 2018-11-26: 500 mL via INTRAVENOUS

## 2018-11-26 MED ORDER — SPIRONOLACTONE 25 MG PO TABS
25.0000 mg | ORAL_TABLET | Freq: Every day | ORAL | Status: DC
  Administered 2018-11-26 – 2018-11-27 (×2): 25 mg via ORAL
  Filled 2018-11-26 (×4): qty 1

## 2018-11-26 MED ORDER — ONDANSETRON HCL 4 MG PO TABS: 4.0000 mg | ORAL_TABLET | Freq: Four times a day (QID) | ORAL | Status: DC | PRN

## 2018-11-26 MED ORDER — VITAMIN B-1 100 MG PO TABS
100.0000 mg | ORAL_TABLET | Freq: Every day | ORAL | Status: DC
  Administered 2018-11-26 – 2018-11-28 (×3): 100 mg via ORAL
  Filled 2018-11-26 (×4): qty 1

## 2018-11-26 MED ORDER — HEPARIN SODIUM (PORCINE) 5000 UNIT/ML IJ SOLN
5000.0000 [IU] | Freq: Three times a day (TID) | INTRAMUSCULAR | Status: DC
  Administered 2018-11-26 – 2018-11-29 (×8): 5000 [IU] via SUBCUTANEOUS
  Filled 2018-11-26 (×8): qty 1

## 2018-11-26 NOTE — ED Notes (Signed)
Pt arrived to ED covered in caked on dry feces, apparent that pt has been sitting in feces for few days. Pt has feces from hair down to feet and under finger nails. Pt is wearing gown she was dc home in Monday was also covered in feces and dried food.

## 2018-11-26 NOTE — ED Notes (Signed)
Pts monitor showing HR 120> with a lot of artifact, EKG completed. MD schaevitz shown EKG and made aware.

## 2018-11-26 NOTE — Progress Notes (Signed)
Family Meeting Note  Advance Directive:no  Today a meeting took place with the Patient.  Patient is able to participate   The following clinical team members were present during this meeting:MD  The following were discussed:Patient's diagnosis: Chronic alcoholism, Patient's progosis: Unable to determine and Goals for treatment: Full Code  Additional follow-up to be provided: prn  Time spent during discussion:20 minutes  Gorden Harms, MD

## 2018-11-26 NOTE — H&P (Signed)
[facility] [place] NAME: [name]    MR#:  [id number]  DATE OF BIRTH:  1956/04/06  DATE OF ADMISSION:  11/26/2018  PRIMARY CARE PHYSICIAN: Alwyn Pea, NP   REQUESTING/REFERRING PHYSICIAN:   CHIEF COMPLAINT:   Chief Complaint  Patient presents with  . Altered Mental Status  . Weakness    HISTORY OF PRESENT ILLNESS: [name]  is a 62 y.o. female with a known history per below, recently discharged from the hospital less than 1 week ago for hepatic encephalopathy secondary to alcoholism, chronic Warnicke's encephalopathy, sent home in care of family, returns to the emergency room with altered mental status/confusion via EMS, was covered with feces/urine, patient was still wearing her hospital gown from last admission 1 week ago, several trays of an eaten food in her bed around the patient, patient with poor p.o. intake, complaining of generalized weakness, fatigue, ER work-up was unimpressive, creatinine 1.6 much better than previous, AST 66, total bili 1.5, ammonia level normal, hemoglobin 11.8, CT head noted for chronic change/no acute process, urinalysis minimally abnormal, hospitalist service asked to admit for altered mental status in patient with known Warnicke's encephalopathy, patient evaluated in the emergency room, no apparent distress, resting comfortably in bed patient is now being admitted for ? altered mental status in patient with known chronic Warnicke's encephalopathy, acute on chronic alcoholism.  PAST MEDICAL HISTORY:   Past Medical History:  Diagnosis Date  . Cancer ([place])    skin  . Depression   . ETOH abuse     PAST SURGICAL HISTORY:  Past Surgical History:  Procedure Laterality Date  . MOLE REMOVAL      SOCIAL HISTORY:  Social History   Tobacco Use  . Smoking status: Current Every Day Smoker    Packs/day: 0.25    Types: Cigarettes  . Smokeless tobacco: Never Used  Substance Use Topics  . Alcohol  use: Yes    Alcohol/week: 1.0 standard drinks    Types: 1 Shots of liquor per week    FAMILY HISTORY:  Family History  Problem Relation Age of Onset  . Breast cancer Neg Hx     DRUG ALLERGIES: No Known Allergies  REVIEW OF SYSTEMS: Poor historian due to Warnicke's encephalopathy  CONSTITUTIONAL: No fever,+ fatigue, weakness.  EYES: No blurred or double vision.  EARS, NOSE, AND THROAT: No tinnitus or ear pain.  RESPIRATORY: No cough, shortness of breath, wheezing or hemoptysis.  CARDIOVASCULAR: No chest pain, orthopnea, edema.  GASTROINTESTINAL: No nausea, vomiting, diarrhea or abdominal pain.  GENITOURINARY: No dysuria, hematuria.  ENDOCRINE: No polyuria, nocturia,  HEMATOLOGY: No anemia, easy bruising or bleeding SKIN: No rash or lesion. MUSCULOSKELETAL: No joint pain or arthritis.   NEUROLOGIC: No tingling, numbness, weakness.  PSYCHIATRY: No anxiety or depression.   MEDICATIONS AT HOME:  Prior to Admission medications   Medication Sig Start Date End Date Taking? Authorizing Provider  ascorbic acid (VITAMIN C) 250 MG tablet Take 1 tablet (250 mg total) by mouth 2 (two) times daily. 11/20/18   [name], MD  ciprofloxacin (CIPRO) 500 MG tablet Take 1 tablet (500 mg total) by mouth daily with breakfast for 14 days. 11/21/18 12/05/18  [name], MD  feeding supplement, ENSURE ENLIVE, (ENSURE ENLIVE) LIQD Take 237 mLs by mouth 2 (two) times daily between meals. 11/20/18   [name], MD  folic acid (FOLVITE) 1 MG tablet Take 1 tablet (1 mg total) by mouth daily. 11/21/18   Pyreddy, Reatha Harps,  MD  lactulose (CHRONULAC) 10 GM/15ML solution Take 45 mLs (30 g total) by mouth 3 (three) times daily. 11/20/18   [name], MD  metoprolol tartrate (LOPRESSOR) 25 MG tablet Take 1 tablet (25 mg total) by mouth 2 (two) times daily. 11/20/18 12/20/18  [name], MD  midodrine (PROAMATINE) 5 MG tablet Take 1 tablet (5 mg total) by mouth 3 (three) times daily with meals. 11/20/18  12/20/18  [name], MD  Multiple Vitamin (MULTIVITAMIN WITH MINERALS) TABS tablet Take 1 tablet by mouth daily. 11/20/18 12/20/18  [name], MD  rifaximin (XIFAXAN) 550 MG TABS tablet Take 1 tablet (550 mg total) by mouth 2 (two) times daily. 11/20/18 12/20/18  [name], MD  senna-docusate (SENOKOT-S) 8.6-50 MG tablet Take 1 tablet by mouth at bedtime as needed for mild constipation. 11/20/18   [name], MD  spironolactone (ALDACTONE) 25 MG tablet Take 1 tablet (25 mg total) by mouth daily. 11/21/18 12/21/18  [name], MD  thiamine 100 MG tablet Take 1 tablet (100 mg total) by mouth daily. 11/20/18   [name], MD      PHYSICAL EXAMINATION:   VITAL SIGNS: Blood pressure 108/79, pulse 93, temperature 97.6 F (36.4 C), temperature source Oral, resp. rate 16, height 5\' 9"  (1.753 m), weight 65.3 kg, SpO2 100 %.  GENERAL:  62 y.o.-year-old patient lying in the bed with no acute distress.  EYES: Pupils equal, round, reactive to light and accommodation. No scleral icterus. Extraocular muscles intact.  HEENT: Head atraumatic, normocephalic. Oropharynx and nasopharynx clear.  NECK:  Supple, no jugular venous distention. No thyroid enlargement, no tenderness.  LUNGS: Normal breath sounds bilaterally, no wheezing, rales,rhonchi or crepitation. No use of accessory muscles of respiration.  CARDIOVASCULAR: S1, S2 normal. No murmurs, rubs, or gallops.  ABDOMEN: Soft, nontender, nondistended. Bowel sounds present. No organomegaly or mass.  EXTREMITIES: No pedal edema, cyanosis, or clubbing.  NEUROLOGIC: Cranial nerves II through XII are intact. MAES. Gait not checked.  PSYCHIATRIC: The patient is alert and oriented x 2-3.  SKIN: No obvious rash, lesion, or ulcer.   LABORATORY PANEL:   CBC Recent Labs  Lab 11/26/18 1521  WBC 10.3  HGB 11.8*  HCT 35.4*  PLT 319  MCV 97.3  MCH 32.4  MCHC 33.3  RDW 14.4  LYMPHSABS 3.1  MONOABS 0.8  EOSABS 0.0  BASOSABS 0.1    ------------------------------------------------------------------------------------------------------------------  Chemistries  Recent Labs  Lab 11/20/18 0542 11/26/18 1521  NA 137 136  K 4.0 4.4  CL 105 102  CO2 24 23  GLUCOSE 100* 90  BUN 20 28*  CREATININE 1.98* 1.69*  CALCIUM 9.1 8.6*  AST  --  66*  ALT  --  26  ALKPHOS  --  98  BILITOT  --  1.5*   ------------------------------------------------------------------------------------------------------------------ estimated creatinine clearance is 35.6 mL/min (A) (by C-G formula based on SCr of 1.69 mg/dL (H)). ------------------------------------------------------------------------------------------------------------------ No results for input(s): TSH, T4TOTAL, T3FREE, THYROIDAB in the last 72 hours.  Invalid input(s): FREET3   Coagulation profile Recent Labs  Lab 11/26/18 1521  INR 1.19   ------------------------------------------------------------------------------------------------------------------- No results for input(s): DDIMER in the last 72 hours. -------------------------------------------------------------------------------------------------------------------  Cardiac Enzymes No results for input(s): CKMB, TROPONINI, MYOGLOBIN in the last 168 hours.  Invalid input(s): CK ------------------------------------------------------------------------------------------------------------------ Invalid input(s): POCBNP  ---------------------------------------------------------------------------------------------------------------  Urinalysis    Component Value Date/Time   COLORURINE YELLOW (A) 11/26/2018 1521   APPEARANCEUR CLOUDY (A) 11/26/2018 1521   LABSPEC 1.013 11/26/2018 1521   PHURINE 5.0 11/26/2018 1521  GLUCOSEU NEGATIVE 11/26/2018 1521   HGBUR NEGATIVE 11/26/2018 1521   BILIRUBINUR NEGATIVE 11/26/2018 1521   KETONESUR NEGATIVE 11/26/2018 1521   PROTEINUR NEGATIVE 11/26/2018 1521   NITRITE  NEGATIVE 11/26/2018 1521   LEUKOCYTESUR MODERATE (A) 11/26/2018 1521     RADIOLOGY: Dg Chest 1 View  Result Date: 11/26/2018 CLINICAL DATA:  Altered mental status. EXAM: CHEST  1 VIEW COMPARISON:  Chest x-ray 11/09/2018 FINDINGS: The cardiac silhouette, mediastinal and hilar contours are normal and stable. The lungs are clear of an acute process. Stable scarring changes in the right mid and lower lung. No effusions. The bony thorax is intact. IMPRESSION: No acute cardiopulmonary findings. Electronically Signed   By: Marijo Sanes M.D.   On: 11/26/2018 16:16   Ct Head Wo Contrast  Result Date: 11/26/2018 CLINICAL DATA:  Weakness and mental status changes. EXAM: CT HEAD WITHOUT CONTRAST TECHNIQUE: Contiguous axial images were obtained from the base of the skull through the vertex without intravenous contrast. COMPARISON:  11/10/2018 FINDINGS: Brain: Stable age advanced cerebral atrophy, ventriculomegaly and periventricular white matter disease. No extra-axial fluid collections are identified. No CT findings for acute hemispheric infarction or intracranial hemorrhage. No mass lesions. The brainstem and cerebellum are normal. Vascular: Stable vascular calcifications. No definite aneurysm or hyperdense vessels. Skull: No skull fracture or bone lesions. Sinuses/Orbits: The paranasal sinuses and mastoid air cells are clear. The globes are intact. Other: No scalp lesions or hematoma. IMPRESSION: 1. Stable age advanced cerebral atrophy, ventriculomegaly and periventricular white matter disease. 2. No acute intracranial findings or skull fracture. Electronically Signed   By: Marijo Sanes M.D.   On: 11/26/2018 16:00    EKG: Orders placed or performed during the hospital encounter of 11/26/18  . EKG 12-Lead  . EKG 12-Lead    IMPRESSION AND PLAN: 62 year old female patient with history of tobacco abuse, alcohol abuse, liver cirrhosis, atrial fibrillation currently under hospitalist service for altered  mental status in patient with known Warnicke's encephalopathy  *Acute encephalopathy Most likely secondary to chronic Warnicke's encephalopathy due to chronic alcoholism Resolved-at baseline Referred to the observation unit, check urine drug screen, urinalysis minimally abnormal-we will need to follow-up on cultures which can be done as an outpatient  *Acute on chronic poor family/social support Presents emergency room covered in feces, found in hospital gown from last admission 1 week ago case management to assist with disposition planning, safekeeping for now  *Chronic alcoholic liver cirrhosis with ascites Noted noncompliance with medications this discharge last admission  Continue midodrine, Xifaxan, spironolactone, empiric daily Cipro for SBP prophylaxis, alcohol withdrawal protocol, check alcohol level  *Chronic kidney disease stage III Stable Avoid nephrotoxic agents  *Chronic atrial fibrillation Continue metoprolol Not a candidate for anticoagulation  *Chronic tobacco smoking abuse/dependency Continue patch and cessation counseling ordered  Overall long term prognosis poor Palliative care reconsulted  All the records are reviewed and case discussed with ED provider. Management plans discussed with the patient, family and they are in agreement.  CODE STATUS:full Code Status History    Date Active Date Inactive Code Status Order ID Comments User Context   11/09/2018 1951 11/20/2018 1804 Full Code [id number]  [name], MD Inpatient   10/25/2018 0251 10/29/2018 2045 Full Code [id number]  Lance Coon, MD Inpatient   05/19/2018 1958 05/21/2018 1712 Full Code [id number]  Fritzi Mandes, MD Inpatient   11/27/2017 0109 11/29/2017 1844 Full Code [id number]  Gorden Harms, MD Inpatient   09/26/2016 1313 09/27/2016 1802 Full Code [id number]  Dustin Flock, MD ED  TOTAL TIME TAKING CARE OF THIS PATIENT: 40 minutes.    Avel Peace Arial Galligan M.D on 11/26/2018   Between 7am to  6pm - Pager - [phone]  After 6pm go to [email or web address] - password [email or web address] [facility]  Office  [phone]  CC: Primary care physician; Alwyn Pea, NP   Note: This dictation was prepared with Dragon dictation along with smaller phrase technology. Any transcriptional errors that result from this process are unintentional.
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
## 2018-11-26 NOTE — ED Notes (Signed)
Social work at bedside.  

## 2018-11-26 NOTE — ED Notes (Signed)
Floor called for pt arriving 

## 2018-11-26 NOTE — ED Triage Notes (Signed)
Pt to ED via EMS from home. Pt lives at home with husband. Per ems pt was dc from hosptial Monday, pt arrives covered in dried feces and urine and wearing hospital gown she was dc home in. Per ems pt had multiple meals laying around her in bed uneaten. Pt states it has been a few days since she has eaten. Pt c/o of weakness.

## 2018-11-26 NOTE — Consult Note (Signed)
CODE SEPSIS - PHARMACY COMMUNICATION  **Broad Spectrum Antibiotics should be administered within 1 hour of Sepsis diagnosis**  Time Code Sepsis Called/Page Received: consult note 1619 - no page  Antibiotics Ordered: cefepime  Time of 1st antibiotic administration: 1632  Additional action taken by pharmacy: n/a  If necessary, Name of Provider/Nurse Contacted: [place], PharmD, BCPS Clinical Pharmacist 11/26/2018 6:16 PM

## 2018-11-26 NOTE — Consult Note (Signed)
Pharmacy Antibiotic Note  [name] is a 62 y.o. female admitted on 11/26/2018 with UTI.  Pharmacy has been consulted for Cefepime dosing.  Plan: Cefepime 1g q 12h (renally adjusted uti dosing)  Height: 5\' 9"  (175.3 cm) Weight: 143 lb 15.4 oz (65.3 kg) IBW/kg (Calculated) : 66.2  Temp (24hrs), Avg:97.6 F (36.4 C), Min:97.6 F (36.4 C), Max:97.6 F (36.4 C)  Recent Labs  Lab 11/20/18 0542 11/26/18 1521  WBC  --  10.3  CREATININE 1.98* 1.69*    Estimated Creatinine Clearance: 35.6 mL/min (A) (by C-G formula based on SCr of 1.69 mg/dL (H)).    No Known Allergies  Antimicrobials this admission: Cefepime 12/8 >>    Dose adjustments this admission: None  Microbiology results: 12/8 UCx: pending   Thank you for allowing pharmacy to be a part of this patient's care.  Lu Duffel, PharmD, BCPS Clinical Pharmacist 11/26/2018 6:31 PM
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
## 2018-11-26 NOTE — ED Notes (Signed)
ED TO INPATIENT HANDOFF REPORT  Name/Age/Gender [name] 62 y.o. female  Code Status Code Status History    Date Active Date Inactive Code Status Order ID Comments User Context   11/09/2018 1951 11/20/2018 1804 Full Code [id number]  [name], MD Inpatient   10/25/2018 0251 10/29/2018 2045 Full Code [id number]  Lance Coon, MD Inpatient   05/19/2018 1958 05/21/2018 1712 Full Code [id number]  Fritzi Mandes, MD Inpatient   11/27/2017 0109 11/29/2017 1844 Full Code [id number]  Gorden Harms, MD Inpatient   09/26/2016 1313 09/27/2016 1802 Full Code [id number]  Dustin Flock, MD ED      Home/SNF/Other Home  Chief Complaint weakness  Level of Care/Admitting Diagnosis ED Disposition    ED Disposition Condition [place]: [place] [[id number]]  Level of Care: Med-Surg [16]  Diagnosis: Encephalopathy chronic [449675]  Admitting Physician: Gorden Harms [[id number]]  Attending Physician: Gorden Harms [[id number]]  PT Class (Do Not Modify): Observation [104]  PT Acc Code (Do Not Modify): Observation [10022]       Medical History Past Medical History:  Diagnosis Date  . Cancer ([place])    skin  . Depression   . ETOH abuse     Allergies No Known Allergies  IV Location/Drains/Wounds Patient Lines/Drains/Airways Status   Active Line/Drains/Airways    Name:   Placement date:   Placement time:   Site:   Days:   Peripheral IV 11/26/18 Left Forearm   11/26/18    1521    Forearm   less than 1   Incision (Closed) 11/10/18 Abdomen Right;Lower   11/10/18    -     16          Labs/Imaging Results for orders placed or performed during the hospital encounter of 11/26/18 (from the past 48 hour(s))  CBC with Differential     Status: Abnormal   Collection Time: 11/26/18  3:21 PM  Result Value Ref Range   WBC 10.3 4.0 - 10.5 K/uL   RBC 3.64 (L) 3.87 - 5.11 MIL/uL   Hemoglobin 11.8 (L) 12.0 - 15.0 g/dL   HCT 35.4 (L) 36.0 - 46.0 %    MCV 97.3 80.0 - 100.0 fL   MCH 32.4 26.0 - 34.0 pg   MCHC 33.3 30.0 - 36.0 g/dL   RDW 14.4 11.5 - 15.5 %   Platelets 319 150 - 400 K/uL   nRBC 0.0 0.0 - 0.2 %   Neutrophils Relative % 61 %   Neutro Abs 6.3 1.7 - 7.7 K/uL   Lymphocytes Relative 30 %   Lymphs Abs 3.1 0.7 - 4.0 K/uL   Monocytes Relative 8 %   Monocytes Absolute 0.8 0.1 - 1.0 K/uL   Eosinophils Relative 0 %   Eosinophils Absolute 0.0 0.0 - 0.5 K/uL   Basophils Relative 1 %   Basophils Absolute 0.1 0.0 - 0.1 K/uL   Immature Granulocytes 0 %   Abs Immature Granulocytes 0.04 0.00 - 0.07 K/uL    Comment: Performed at [facility], [address] [place], [place] [address]  Comprehensive metabolic panel     Status: Abnormal   Collection Time: 11/26/18  3:21 PM  Result Value Ref Range   Sodium 136 135 - 145 mmol/L   Potassium 4.4 3.5 - 5.1 mmol/L   Chloride 102 98 - 111 mmol/L   CO2 23 22 - 32 mmol/L   Glucose, Bld 90 70 - 99 mg/dL   BUN 28 (  H) 8 - 23 mg/dL   Creatinine, Ser 1.69 (H) 0.44 - 1.00 mg/dL   Calcium 8.6 (L) 8.9 - 10.3 mg/dL   Total Protein 6.0 (L) 6.5 - 8.1 g/dL   Albumin 3.1 (L) 3.5 - 5.0 g/dL   AST 66 (H) 15 - 41 U/L   ALT 26 0 - 44 U/L   Alkaline Phosphatase 98 38 - 126 U/L   Total Bilirubin 1.5 (H) 0.3 - 1.2 mg/dL   GFR calc non Af Amer 32 (L) >60 mL/min   GFR calc Af Amer 37 (L) >60 mL/min   Anion gap 11 5 - 15    Comment: Performed at [facility], [address] [place], [place] [address]  Lipase, blood     Status: None   Collection Time: 11/26/18  3:21 PM  Result Value Ref Range   Lipase 29 11 - 51 U/L    Comment: Performed at [facility], [address] [place], [place] [address]  Urinalysis, Complete w Microscopic     Status: Abnormal   Collection Time: 11/26/18  3:21 PM  Result Value Ref Range   Color, Urine YELLOW (A) YELLOW   APPearance CLOUDY (A) CLEAR   Specific Gravity, Urine 1.013 1.005 - 1.030   pH 5.0 5.0 - 8.0   Glucose, UA NEGATIVE  NEGATIVE mg/dL   Hgb urine dipstick NEGATIVE NEGATIVE   Bilirubin Urine NEGATIVE NEGATIVE   Ketones, ur NEGATIVE NEGATIVE mg/dL   Protein, ur NEGATIVE NEGATIVE mg/dL   Nitrite NEGATIVE NEGATIVE   Leukocytes, UA MODERATE (A) NEGATIVE   RBC / HPF 0-5 0 - 5 RBC/hpf   WBC, UA >50 (H) 0 - 5 WBC/hpf   Bacteria, UA NONE SEEN NONE SEEN   Squamous Epithelial / LPF 0-5 0 - 5   WBC Clumps PRESENT    Mucus PRESENT     Comment: Performed at [facility], [address] [place], [place] [address]  Protime-INR     Status: None   Collection Time: 11/26/18  3:21 PM  Result Value Ref Range   Prothrombin Time 15.0 11.4 - 15.2 seconds   INR 1.19     Comment: Performed at [facility], [address] [place], [place] [address]  Ammonia     Status: None   Collection Time: 11/26/18  3:21 PM  Result Value Ref Range   Ammonia 15 9 - 35 umol/L    Comment: Performed at [facility], [address] [place], [place] [address]  Ethanol     Status: Abnormal   Collection Time: 11/26/18  3:21 PM  Result Value Ref Range   Alcohol, Ethyl (B) 22 (H) <10 mg/dL    Comment: (NOTE) Lowest detectable limit for serum alcohol is 10 mg/dL. For medical purposes only. Performed at [facility], [address], [place], [place] [address]    Dg Chest 1 View  Result Date: 11/26/2018 CLINICAL DATA:  Altered mental status. EXAM: CHEST  1 VIEW COMPARISON:  Chest x-ray 11/09/2018 FINDINGS: The cardiac silhouette, mediastinal and hilar contours are normal and stable. The lungs are clear of an acute process. Stable scarring changes in the right mid and lower lung. No effusions. The bony thorax is intact. IMPRESSION: No acute cardiopulmonary findings. Electronically Signed   By: Marijo Sanes M.D.   On: 11/26/2018 16:16   Ct Head Wo Contrast  Result Date: 11/26/2018 CLINICAL DATA:  Weakness and mental status changes. EXAM: CT HEAD WITHOUT CONTRAST TECHNIQUE: Contiguous axial  images were  obtained from the base of the skull through the vertex without intravenous contrast. COMPARISON:  11/10/2018 FINDINGS: Brain: Stable age advanced cerebral atrophy, ventriculomegaly and periventricular white matter disease. No extra-axial fluid collections are identified. No CT findings for acute hemispheric infarction or intracranial hemorrhage. No mass lesions. The brainstem and cerebellum are normal. Vascular: Stable vascular calcifications. No definite aneurysm or hyperdense vessels. Skull: No skull fracture or bone lesions. Sinuses/Orbits: The paranasal sinuses and mastoid air cells are clear. The globes are intact. Other: No scalp lesions or hematoma. IMPRESSION: 1. Stable age advanced cerebral atrophy, ventriculomegaly and periventricular white matter disease. 2. No acute intracranial findings or skull fracture. Electronically Signed   By: Marijo Sanes M.D.   On: 11/26/2018 16:00    Pending Labs Unresulted Labs (From admission, onward)    Start     Ordered   11/27/18 0500  Creatinine, serum  Tomorrow morning,   STAT     11/26/18 1858   11/26/18 1630  Urine drugs of abuse scrn w alc, routine (Ref Lab)  Once,   STAT     11/26/18 1630   11/26/18 1519  Urine Culture  Add-on,   AD     11/26/18 1518   Signed and Held  CBC  (heparin)  Once,   R    Comments:  Baseline for heparin therapy IF NOT ALREADY DRAWN.  Notify MD if PLT < 100 K.    Signed and Held   Signed and Held  Creatinine, serum  (heparin)  Once,   R    Comments:  Baseline for heparin therapy IF NOT ALREADY DRAWN.    Signed and Held          Vitals/Pain Today's Vitals   11/26/18 1444 11/26/18 1500 11/26/18 1600 11/26/18 1630  BP:  108/79 113/86 102/76  Pulse:  93 (!) 101 97  Resp:  16 19 16   Temp:  97.6 F (36.4 C)    TempSrc:  Oral    SpO2:  100% 100% 99%  Weight: 65.3 kg     Height: 5\' 9"  (1.753 m)     PainSc: 0-No pain       Isolation Precautions No active isolations  Medications Medications  ceFEPIme  (MAXIPIME) 1 g in sodium chloride 0.9 % 100 mL IVPB (has no administration in time range)  ceFEPIme (MAXIPIME) 2 g in sodium chloride 0.9 % 100 mL IVPB (0 g Intravenous Stopped 11/26/18 1810)    Mobility Ambulatory with objects

## 2018-11-26 NOTE — ED Provider Notes (Signed)
[facility] Emergency Department Provider Note  ___________________________________________   First MD Initiated Contact with Patient 11/26/18 1501     (approximate)  I have reviewed the triage vital signs and the nursing notes.   HISTORY  Chief Complaint Altered Mental Status and Weakness   HPI [name] is a 62 y.o. female with a history of alcohol abuse, recent encephalopathy now prescribed rifaximin as well as lactulose was presented to the emergency department with confusion.  The patient is denying any pain.  Says that she was brought into the emergency department by her husband.  She is not having any complaints at this time.  Despite being prescribed lactulose as well as rifaximin patient medications but still has the prescriptions that she was given upon discharge on December 2.  The patient's nurse reported that she was caked in stool upon arrival.  From EMS also revealed patient was surrounded by containers of old food at home and only lives with her 19 year old husband.  Patient admits to drinking earlier today.  Says "half a Gatorade bottle" of liquor.  Patient denies any focal weakness.  Unclear of onset of any weakness or confusion.  Patient also stating that she "was living with my primary care physician while is working at the [employer]."  Past Medical History:  Diagnosis Date  . Cancer ([place])    skin  . Depression   . ETOH abuse     Patient Active Problem List   Diagnosis Date Noted  . Weakness generalized   . Encephalopathy   . Palliative care by specialist   . DNR (do not resuscitate) discussion   . Ascites 11/11/2018  . Renal failure 11/09/2018  . Dysthymia 10/26/2018  . Sepsis ([place]) 10/25/2018  . ETOH abuse 10/25/2018  . A-fib ([place]) 11/26/2017  . Malnutrition of moderate degree 09/27/2016  . Acute pancreatitis 09/26/2016    Past Surgical History:  Procedure Laterality Date  . MOLE REMOVAL      Prior to Admission  medications   Medication Sig Start Date End Date Taking? Authorizing Provider  ascorbic acid (VITAMIN C) 250 MG tablet Take 1 tablet (250 mg total) by mouth 2 (two) times daily. 11/20/18   [name], MD  ciprofloxacin (CIPRO) 500 MG tablet Take 1 tablet (500 mg total) by mouth daily with breakfast for 14 days. 11/21/18 12/05/18  [name], MD  feeding supplement, ENSURE ENLIVE, (ENSURE ENLIVE) LIQD Take 237 mLs by mouth 2 (two) times daily between meals. 11/20/18   [name], MD  folic acid (FOLVITE) 1 MG tablet Take 1 tablet (1 mg total) by mouth daily. 11/21/18   [name], MD  lactulose (CHRONULAC) 10 GM/15ML solution Take 45 mLs (30 g total) by mouth 3 (three) times daily. 11/20/18   [name], MD  metoprolol tartrate (LOPRESSOR) 25 MG tablet Take 1 tablet (25 mg total) by mouth 2 (two) times daily. 11/20/18 12/20/18  [name], MD  midodrine (PROAMATINE) 5 MG tablet Take 1 tablet (5 mg total) by mouth 3 (three) times daily with meals. 11/20/18 12/20/18  [name], MD  Multiple Vitamin (MULTIVITAMIN WITH MINERALS) TABS tablet Take 1 tablet by mouth daily. 11/20/18 12/20/18  [name], MD  rifaximin (XIFAXAN) 550 MG TABS tablet Take 1 tablet (550 mg total) by mouth 2 (two) times daily. 11/20/18 12/20/18  [name], MD  senna-docusate (SENOKOT-S) 8.6-50 MG tablet Take 1 tablet by mouth at bedtime as needed for mild constipation. 11/20/18   [name], MD  spironolactone (ALDACTONE) 25 MG tablet Take 1 tablet (25 mg total) by mouth daily. 11/21/18 12/21/18  [name], MD  thiamine 100 MG tablet Take 1 tablet (100 mg total) by mouth daily. 11/20/18   [name], MD    Allergies Patient has no known allergies.  Family History  Problem Relation Age of Onset  . Breast cancer Neg Hx     Social History Social History   Tobacco Use  . Smoking status: Current Every Day Smoker    Packs/day: 0.25    Types: Cigarettes  . Smokeless tobacco: Never Used    Substance Use Topics  . Alcohol use: Yes    Alcohol/week: 1.0 standard drinks    Types: 1 Shots of liquor per week  . Drug use: No    Review of Systems  Constitutional: No fever/chills Eyes: No visual changes. ENT: No sore throat. Cardiovascular: Denies chest pain. Respiratory: Denies shortness of breath. Gastrointestinal: No abdominal pain.  No nausea, no vomiting.  No diarrhea.  No constipation. Genitourinary: Negative for dysuria. Musculoskeletal: Negative for back pain. Skin: Negative for rash. Neurological: Negative for headaches, focal weakness or numbness.   ____________________________________________   PHYSICAL EXAM:  VITAL SIGNS: ED Triage Vitals  Enc Vitals Group     BP 11/26/18 1500 108/79     Pulse Rate 11/26/18 1500 93     Resp 11/26/18 1500 16     Temp 11/26/18 1500 97.6 F (36.4 C)     Temp Source 11/26/18 1500 Oral     SpO2 11/26/18 1500 100 %     Weight 11/26/18 1444 143 lb 15.4 oz (65.3 kg)     Height 11/26/18 1444 5\' 9"  (1.753 m)     Head Circumference --      Peak Flow --      Pain Score 11/26/18 1444 0     Pain Loc --      Pain Edu? --      Excl. in [place]? --     Constitutional: Alert and oriented 3. Well appearing and in no acute distress.however, the patient did say several strange things such as that she was living with her primary care doctor at one point. Eyes: Conjunctivae are normal.  Head: Atraumatic. Nose: No congestion/rhinnorhea. Mouth/Throat: Mucous membranes are moist.  Neck: No stridor.   Cardiovascular: tachycardic with an irregularly irregular rhythm.. Grossly normal heart sounds.   Respiratory: Normal respiratory effort.  No retractions. Lungs CTAB. Gastrointestinal: Soft and nontender. abdomen is distended with a positive fluid wave. Musculoskeletal: No lower extremity tendernesshowever, there is mild bilateral lower extremity edema to the calves. Neurologic:  Normal speech and language. RLE 4/5 strength.  LLE 3/5  strength.  Skin:  Skin is warm, dry and intact. No rash noted. Psychiatric: Mood and affect are normal. Speech and behavior are normal.  ____________________________________________   LABS (all labs ordered are listed, but only abnormal results are displayed)  Labs Reviewed  CBC WITH DIFFERENTIAL/PLATELET - Abnormal; Notable for the following components:      Result Value   RBC 3.64 (*)    Hemoglobin 11.8 (*)    HCT 35.4 (*)    All other components within normal limits  COMPREHENSIVE METABOLIC PANEL - Abnormal; Notable for the following components:   BUN 28 (*)    Creatinine, Ser 1.69 (*)    Calcium 8.6 (*)    Total Protein 6.0 (*)    Albumin 3.1 (*)    AST 66 (*)    Total Bilirubin  1.5 (*)    GFR calc non Af Amer 32 (*)    GFR calc Af Amer 37 (*)    All other components within normal limits  URINALYSIS, COMPLETE (UACMP) WITH MICROSCOPIC - Abnormal; Notable for the following components:   Color, Urine YELLOW (*)    APPearance CLOUDY (*)    Leukocytes, UA MODERATE (*)    WBC, UA >50 (*)    All other components within normal limits  URINE CULTURE  LIPASE, BLOOD  PROTIME-INR  AMMONIA  ETHANOL   ____________________________________________  EKG  ED ECG REPORT I, Doran Stabler, the attending physician, personally viewed and interpreted this ECG.   Date: 11/26/2018  EKG Time: 1500  Rate: 100  Rhythm: sinus tachycardia  Axis: normal  Intervals:none  ST&T Change: No ST segment elevation or depression.  No abnormal T wave inversion.  ____________________________________________  RADIOLOGY  CT head without any acute process.  No acute process identified on the chest x-ray. ____________________________________________   PROCEDURES  Procedure(s) performed: None  Procedures  Critical Care performed: No  ____________________________________________   INITIAL IMPRESSION / ASSESSMENT AND PLAN / ED COURSE  Pertinent labs & imaging results that were  available during my care of the patient were reviewed by me and considered in my medical decision making (see chart for details).  Differential diagnosis includes, but is not limited to, alcohol, illicit or prescription medications, or other toxic ingestion; intracranial pathology such as stroke or intracerebral hemorrhage; fever or infectious causes including sepsis; hypoxemia and/or hypercarbia; uremia; trauma; endocrine related disorders such as diabetes, hypoglycemia, and thyroid-related diseases; hypertensive encephalopathy; etc. As part of my medical decision making, I reviewed the following data within the electronic MEDICAL RECORD NUMBER Notes from prior ED visits  ----------------------------------------- 4:18 PM on 11/26/2018 -----------------------------------------  Patient found to have a urinary tract infection which is likely the cause of her confusion.  Social work consultation ordered because the patient take confused when she arrived.  This is also her third admission in approximately the past month.  Because of multiple hospitalizations the patient be given cefepime.  While she is alert and oriented x3 she also has had some very strange things including her comment about living with her primary care doctor and working at the [employer].  Patient at this time also appears to be moving all 4 extremities equally.  I am suspecting that the weakness is likely related to the UTI.  However, her neurologic status will require continued observation upon admission.  Signed out to Dr. Nicola Girt of the medicine service.  Patient understanding of the need for admission as well as the diagnosis and willing to comply. ____________________________________________   FINAL CLINICAL IMPRESSION(S) / ED DIAGNOSES  UTI.  Altered mental status.  NEW MEDICATIONS STARTED DURING THIS VISIT:  New Prescriptions   No medications on file     Note:  This document was prepared using Dragon voice recognition software  and may include unintentional dictation errors.     Orbie Pyo, MD 11/26/18 [phone]

## 2018-11-26 NOTE — Progress Notes (Signed)
LCSW called [employer] spoke to [facility] and gave her the short version of this patients care since her last DC, she asked if this patient is a ward of the state- I indicated no, She stated she is driving and will call me back in 20 minutes.   LCSW will take work phone and patient information and follow up with [employer] worker tonight.  [employer] LCSW [phone]

## 2018-11-27 LAB — CREATININE, SERUM
Creatinine, Ser: 1.56 mg/dL — ABNORMAL HIGH (ref 0.44–1.00)
GFR calc Af Amer: 41 mL/min — ABNORMAL LOW (ref >60)
GFR, EST NON AFRICAN AMERICAN: 35 mL/min — AB (ref >60)

## 2018-11-27 LAB — URINE CULTURE: Culture: 60000 — AB

## 2018-11-27 MED ORDER — CALCIUM CARBONATE ANTACID 500 MG PO CHEW
1.0000 | CHEWABLE_TABLET | Freq: Three times a day (TID) | ORAL | Status: DC | PRN
  Administered 2018-11-27 (×2): 200 mg via ORAL
  Filled 2018-11-27 (×2): qty 1

## 2018-11-27 MED ORDER — ENSURE ENLIVE PO LIQD
237.0000 mL | Freq: Three times a day (TID) | ORAL | Status: DC
  Administered 2018-11-28 (×2): 237 mL via ORAL

## 2018-11-27 NOTE — Progress Notes (Signed)
LCSW is placing a late submission. Under the direction of charge nurse I was asked to call and make an [employer] report as EMS and ED RN's observed patient in same gone from last week covered in fecal matter.  [employer] [place]- From  called and LCSW provided additional details at 7 pm last night and she will file a report.LCSW consulted Team Lead Levada Dy.  [employer] LCSW [phone]

## 2018-11-27 NOTE — Progress Notes (Signed)
Please note patient has a pending outpatient Palliative referral from last admission. CMRN Marshell Garfinkel made aware. Flo Shanks RN, [facility] [place], hospital Liaison [phone]

## 2018-11-27 NOTE — Progress Notes (Signed)
Initial Nutrition Assessment  DOCUMENTATION CODES:   Non-severe (moderate) malnutrition in context of chronic illness  INTERVENTION:  Ensure Enlive po BID, each supplement provides 350 kcal and 20 grams of protein Magic cup TID with meals, each supplement provides 290 kcal and 9 grams of protein [employer] Breakfast daily, each supplement provides 130 kcal and 5 grams of protein Provide snacks in between meals to encourage PO intake Continue with VitC daily Continue with MVI daily  At risk for refeeding  - monitor K, Mag, Phos daily   NUTRITION DIAGNOSIS:   Moderate Malnutrition related to chronic illness(chronic encephalopathy secondary to EtOH abuse) as evidenced by moderate muscle depletion, moderate fat depletion, meal completion < 25%, edema.   GOAL:   Patient will meet greater than or equal to 90% of their needs  MONITOR:   PO intake, Supplement acceptance, Skin, Weight trends, I & O's, Labs  REASON FOR ASSESSMENT:   Consult Assessment of nutrition requirement/status  ASSESSMENT:  62 year old female with known history of EtOH abuse, liver cirrhosis, ascites, Afib, acute pancreatitis, with recent ED to hospital admit 11/09/18 for hepatic encephalopathy. Patient presented to ED 12/8 with altered mental status and complains of generalized weakness and poor PO.    Per MD note, EMS reported pt was covered in feces/urine and still wearing her hospital gown from last admission 1 week prior with several trays of partially eaten food in her bed.  Pt awake with RN in room at visit. Patient reports 0% of breakfast and lunch today with RN confirming only water and juice intake today. Patient stated that she liked orange juice, but it burned her throat. RD inquired about poor PO and patient responded that she just forgets to eat if it is not in front of her. Patient reports liking snacking on peanuts, but poor historian of dietary recall.   RN had brought strawberry Ensure  to patient during interview and patient stated that she loved them in all flavors. Patient open to receiving Ensure, Magic Cup, and snacks throughout the day to improve PO intake.   Pt recalls UBW of 115lb, but unknown when she last weighed that amount and stated her friend weighed her this morning at 165lb(RN mentioned that patient has been talking about several different people today) Last documented weight taken today is 145lbs  Medications reviewed and include: Folic acid, Thiamine, vitC, MVI, Xifaxan, lactulose  Labs: BUN 28 (H) Cr 1.56 (H) Ca corrects for low albumin    NUTRITION - FOCUSED PHYSICAL EXAM:   Diet Order:   Diet Order            Diet Heart Room service appropriate? Yes; Fluid consistency: Thin  Diet effective now              EDUCATION NEEDS:   Not appropriate for education at this time  Skin:  Skin Assessment: Skin Integrity Issues:(incision (closed); abdomen rt; lower) Skin Integrity Issues:: Other (Comment) Other: (BL bottom of feet; red and tender)  Last BM:  12/09; Type 6, Small; Brown  Height:   Ht Readings from Last 1 Encounters:  11/26/18 5\' 9"  (1.753 m)    Weight:   Wt Readings from Last 1 Encounters:  11/27/18 64.9 kg    Ideal Body Weight:  65.9 kg  BMI:  Body mass index is 21.13 kg/m.  Estimated Nutritional Needs:   Kcal:  1749-4496 (MSJ 1.3-1.5)  Protein:  85-98g/day  Fluid:  1.2L/day    Lajuan Lines, RD, LDN  After  Hours/Weekend Pager: [phone]

## 2018-11-27 NOTE — Progress Notes (Signed)
No charge note:  Thank you for this consult to Palliative medicine.   Discussed with Dr. Brett Albino- patient's attending- and Flo Shanks, RN with [facility]- patient was seen by inpatient Palliative team on 11/27 with [place] to continue medical care and be followed by outpatient Palliative.  Referral to outpatient palliative is open- patient is expecting to be discharged today.  Per Dr. Brett Albino- ok for outpatient palliative to followup. Inpatient Palliative team will not see- will sign off.   Mariana Kaufman, AGNP-C Palliative Medicine  Please call Palliative Medicine team phone with any questions [phone]. For individual providers please see AMION.

## 2018-11-27 NOTE — Progress Notes (Signed)
[name] NAME: [name]    MR#:  [id number]  DATE OF BIRTH:  [birth year]  SUBJECTIVE:   No concerns this morning.  Remains confused.  REVIEW OF SYSTEMS:  ROS-unable to obtain due to confusion.  DRUG ALLERGIES:  No Known Allergies VITALS:  Blood pressure 92/62, pulse (!) 108, temperature 97.8 F (36.6 C), temperature source Oral, resp. rate 15, height 5\' 9"  (1.753 m), weight 64.9 kg, SpO2 100 %. PHYSICAL EXAMINATION:  Physical Exam  GENERAL:  62 y.o.-year-old patient lying in the bed with no acute distress.  EYES: Pupils equal, round, reactive to light and accommodation. No scleral icterus. Extraocular muscles intact.  HEENT: Head atraumatic, normocephalic. Oropharynx and nasopharynx clear.  Moist mucous membranes. NECK:  Supple, no jugular venous distention. No thyroid enlargement, no tenderness.  LUNGS: Normal breath sounds bilaterally, no wheezing, rales,rhonchi or crepitation. No use of accessory muscles of respiration.  CARDIOVASCULAR: Tachycardic, regular rhythm, S1, S2 normal. No murmurs, rubs, or gallops.  ABDOMEN: Soft, nontender, nondistended. Bowel sounds present. No organomegaly or mass.  EXTREMITIES: No pedal edema, cyanosis, or clubbing.  NEUROLOGIC: Follows commands, moves all extremities PSYCHIATRIC: The patient is alert, oriented only to person and place. SKIN: No obvious rash, lesion, or ulcer.  LABORATORY PANEL:  Female CBC Recent Labs  Lab 11/26/18 1521  WBC 10.3  HGB 11.8*  HCT 35.4*  PLT 319   ------------------------------------------------------------------------------------------------------------------ Chemistries  Recent Labs  Lab 11/26/18 1521 11/27/18 0329  NA 136  --   K 4.4  --   CL 102  --   CO2 23  --   GLUCOSE 90  --   BUN 28*  --   CREATININE 1.69* 1.56*  CALCIUM 8.6*  --   AST 66*  --   ALT 26  --   ALKPHOS 98  --   BILITOT 1.5*  --    RADIOLOGY:  Dg Chest 1  View  Result Date: 11/26/2018 CLINICAL DATA:  Altered mental status. EXAM: CHEST  1 VIEW COMPARISON:  Chest x-ray 11/09/2018 FINDINGS: The cardiac silhouette, mediastinal and hilar contours are normal and stable. The lungs are clear of an acute process. Stable scarring changes in the right mid and lower lung. No effusions. The bony thorax is intact. IMPRESSION: No acute cardiopulmonary findings. Electronically Signed   By: Marijo Sanes M.D.   On: 11/26/2018 16:16   Ct Head Wo Contrast  Result Date: 11/26/2018 CLINICAL DATA:  Weakness and mental status changes. EXAM: CT HEAD WITHOUT CONTRAST TECHNIQUE: Contiguous axial images were obtained from the base of the skull through the vertex without intravenous contrast. COMPARISON:  11/10/2018 FINDINGS: Brain: Stable age advanced cerebral atrophy, ventriculomegaly and periventricular white matter disease. No extra-axial fluid collections are identified. No CT findings for acute hemispheric infarction or intracranial hemorrhage. No mass lesions. The brainstem and cerebellum are normal. Vascular: Stable vascular calcifications. No definite aneurysm or hyperdense vessels. Skull: No skull fracture or bone lesions. Sinuses/Orbits: The paranasal sinuses and mastoid air cells are clear. The globes are intact. Other: No scalp lesions or hematoma. IMPRESSION: 1. Stable age advanced cerebral atrophy, ventriculomegaly and periventricular white matter disease. 2. No acute intracranial findings or skull fracture. Electronically Signed   By: Marijo Sanes M.D.   On: 11/26/2018 16:00   ASSESSMENT AND PLAN:   Acute encephalopathy- continues to be confused this morning.  After discussion with husband, does not seem that patient is quite at her mental status baseline.  Likely due to chronic alcoholism and not taking meds at home. -Continue rifaximin and lactulose -Discussed with husband the importance of alcohol cessation  ?UTI- patient asymptomatic, but she is also confused.   UA with moderate leukocytes. -Urine culture pending -Continue ceftriaxone for now  Poor family/social support- came into the ED covered in urine and feces. -Discussed with husband over the phone, who states he is having a difficult time taking care of her at home -Would benefit from home health services or long-term care, but patient does not have insurance -APS report pending  Chronic alcoholic liver cirrhosis with ascites- husband confirms that patient is noncompliant with meds at home. -Continue midodrine,  vaccination, spironolactone, empiric daily Cipro for SBP prophylaxis -CIWA -Outpatient palliative care to follow on discharge  Chronic kidney disease stage III- stable, creatinine better than baseline. -Avoid nephrotoxic agents  Chronic atrial fibrillation- rate-controlled -Continue metoprolol -Not a candidate for anticoagulation  Chronic tobacco smoking abuse/dependency -Continue nicotine patch and cessation counseling performed  All the records are reviewed and case discussed with Care Management/Social Worker. Management plans discussed with the patient, family and they are in agreement.  CODE STATUS: Full Code  TOTAL TIME TAKING CARE OF THIS PATIENT: 45 minutes.   More than 50% of the time was spent in counseling/coordination of care: YES  POSSIBLE D/C tomorrow, DEPENDING ON CLINICAL CONDITION.   Berna Spare  M.D on 11/27/2018 at 1:28 PM  Between 7am to 6pm - Pager - [phone]  After 6pm go to [email or web address] - Proofreader  [facility]  Office  [phone]  CC: Primary care physician; System, Pcp Not In  Note: This dictation was prepared with Dragon dictation along with smaller phrase technology. Any transcriptional errors that result from this process are unintentional.

## 2018-11-27 NOTE — Progress Notes (Signed)
Spoke to APS worker she is on her way to see patient. Texted other LCSW  Adric Wrede LCSW

## 2018-11-27 NOTE — Progress Notes (Signed)
APS worker [name] [phone] came to see patient at bedside today. CSW made APS worker aware that on the last admission patient and her husband make $3,068 per [facility] representative and do not qualify for charity home health care. APS worker is aware that patient also has no payer for SNF placement and will likely D/C home tomorrow.   [employer], LCSW [phone]

## 2018-11-27 NOTE — Progress Notes (Signed)
Per Clinical Social Worker's (CSW) note on 11/14/18 patient's husband Joaquim Lai reported that at baseline patient is bed bound and total care. Per husband patient was denied medicaid. CSW contacted [facility] [employer] (APS) supervisor La'Porschsa to see if APS was going to open the case. Per La'Porschsa the APS report was screened out because patient did not have disability. CSW made La'Porschsa aware of patient's diagnosis and the information that patient's husband provided on 11/14/18. CSW also made La'Porschsa aware that per MD patient will likely D/C home today. Per La'Porschsa there is enough to open an APS report and they will follow up with patient.   [employer], LCSW [phone]

## 2018-11-27 NOTE — Progress Notes (Signed)
Pt is only alert to self this shift. Abdomen is distended. Lactulose administered as prescribed, Pt is having BMs. APS has visited the pt. Pt in no acute distress at this time and VSS. Will continue to monitor.

## 2018-11-27 NOTE — Care Management (Addendum)
RNCM consult received and will follow. Patient does not have health insurance. CSW assessment note is in place.  Patient apparently does not walk much. Patient does not appear to has any skilled needs for home health. RNCM will follow. Patient and her husband were to decide on last admission whether they would pay privately for home health as she made too much money for  [facility] home health.  RNCM arrange for palliative to see patient also.  I have reached out to [place] with [facility] and Treanna with home palliative. Update: RNCM spoke with palliative nurse and they are waiting on her PCP to certify home palliative.  EMS packet started. RNCM received notification from [facility] that they will reach out to patient's husband again to see if he would like to pay privately for home health services.  Patient's income is greater than charity allows.

## 2018-11-28 LAB — CBC
HCT: 28.9 % — ABNORMAL LOW (ref 36.0–46.0)
Hemoglobin: 9.6 g/dL — ABNORMAL LOW (ref 12.0–15.0)
MCH: 32 pg (ref 26.0–34.0)
MCHC: 33.2 g/dL (ref 30.0–36.0)
MCV: 96.3 fL (ref 80.0–100.0)
Platelets: 340 10*3/uL (ref 150–400)
RBC: 3 MIL/uL — ABNORMAL LOW (ref 3.87–5.11)
RDW: 14.5 % (ref 11.5–15.5)
WBC: 10 10*3/uL (ref 4.0–10.5)
nRBC: 0 % (ref 0.0–0.2)

## 2018-11-28 LAB — BASIC METABOLIC PANEL
Anion gap: 9 (ref 5–15)
BUN: 28 mg/dL — ABNORMAL HIGH (ref 8–23)
CO2: 20 mmol/L — ABNORMAL LOW (ref 22–32)
Calcium: 8.1 mg/dL — ABNORMAL LOW (ref 8.9–10.3)
Chloride: 106 mmol/L (ref 98–111)
Creatinine, Ser: 1.64 mg/dL — ABNORMAL HIGH (ref 0.44–1.00)
GFR calc Af Amer: 38 mL/min — ABNORMAL LOW (ref >60)
GFR calc non Af Amer: 33 mL/min — ABNORMAL LOW (ref >60)
Glucose, Bld: 111 mg/dL — ABNORMAL HIGH (ref 70–99)
Potassium: 3.1 mmol/L — ABNORMAL LOW (ref 3.5–5.1)
Sodium: 135 mmol/L (ref 135–145)

## 2018-11-28 LAB — IRON AND TIBC: Iron: 63 ug/dL (ref 28–170)

## 2018-11-28 LAB — FERRITIN: Ferritin: 660 ng/mL — ABNORMAL HIGH (ref 11–307)

## 2018-11-28 MED ORDER — POTASSIUM CHLORIDE CRYS ER 20 MEQ PO TBCR
40.0000 meq | EXTENDED_RELEASE_TABLET | Freq: Three times a day (TID) | ORAL | Status: AC
  Administered 2018-11-28 (×2): 40 meq via ORAL
  Filled 2018-11-28 (×2): qty 2

## 2018-11-28 NOTE — Care Management (Signed)
MD shared that patient seems more altered today and doesn't feel that patient should discharge.  She will reach out to [employer].

## 2018-11-28 NOTE — Progress Notes (Signed)
Clinical Education officer, museum (CSW) received a call from [facility] today. Per APS worker they are going to get in home aides in patient's home for as often as they can and help her apply for long term care medicaid. RN case Freight forwarder and RN aware of above.   [employer], LCSW [phone]

## 2018-11-28 NOTE — Progress Notes (Addendum)
[facility] [place] NAME: [name]    MR#:  [id number]  DATE OF BIRTH:  1956/08/17  SUBJECTIVE:   Patient seems more confused today.  Talking but not making sense.  REVIEW OF SYSTEMS:  ROS-unable to obtain due to confusion.  DRUG ALLERGIES:  No Known Allergies VITALS:  Blood pressure (!) 82/58, pulse 89, temperature 98.1 F (36.7 C), temperature source Oral, resp. rate 16, height 5\' 9"  (1.753 m), weight 65.3 kg, SpO2 99 %. PHYSICAL EXAMINATION:  Physical Exam  GENERAL:  62 y.o.-year-old patient lying in the bed with no acute distress.  EYES: Pupils equal, round, reactive to light and accommodation. No scleral icterus. Extraocular muscles intact.  HEENT: Head atraumatic, normocephalic. Oropharynx and nasopharynx clear.  Moist mucous membranes. NECK:  Supple, no jugular venous distention. No thyroid enlargement, no tenderness.  LUNGS: Normal breath sounds bilaterally, no wheezing, rales,rhonchi or crepitation. No use of accessory muscles of respiration.  CARDIOVASCULAR: RRR, S1, S2 normal. No murmurs, rubs, or gallops.  ABDOMEN: Soft, nontender. +distension with fluid wave. Bowel sounds present. No organomegaly or mass.  EXTREMITIES: No pedal edema, cyanosis, or clubbing.  NEUROLOGIC: Follows commands, moves all extremities PSYCHIATRIC: The patient is alert, oriented only to person and place. SKIN: No obvious rash, lesion, or ulcer.  LABORATORY PANEL:  Female CBC Recent Labs  Lab 11/28/18 0453  WBC 10.0  HGB 9.6*  HCT 28.9*  PLT 340   ------------------------------------------------------------------------------------------------------------------ Chemistries  Recent Labs  Lab 11/26/18 1521  11/28/18 0453  NA 136  --  135  K 4.4  --  3.1*  CL 102  --  106  CO2 23  --  20*  GLUCOSE 90  --  111*  BUN 28*  --  28*  CREATININE 1.69*   < > 1.64*  CALCIUM 8.6*  --  8.1*  AST 66*  --   --   ALT 26  --   --   ALKPHOS 98   --   --   BILITOT 1.5*  --   --    < > = values in this interval not displayed.   RADIOLOGY:  No results found. ASSESSMENT AND PLAN:   Acute encephalopathy- patient significantly more altered today compared to yesterday. Likely due to chronic alcoholism and not taking meds at home.  No focal deficits. -Continue rifaximin and lactulose -Discussed with husband the importance of alcohol cessation  Hypotension- new this morning.  Likely related to restarting metoprolol and spironolactone. -Will hold metoprolol and spironolactone today -Monitor blood pressures closely  Abnormal urine culture- urine culture with 60,000 colonies of yeast.  Likely contaminant. -Stop all antibiotics  Poor family/social support- came into the ED covered in urine and feces. -Discussed with husband over the phone, who states he is having a difficult time taking care of her at home -Would benefit from home health services or long-term care, but patient does not have insurance -APS following  Chronic alcoholic liver cirrhosis with ascites- husband confirms that patient is noncompliant with meds at home. -Continue midodrine, rifaximin, spironolactone, empiric daily Cipro for SBP prophylaxis -CIWA -Outpatient palliative care to follow on discharge  Chronic kidney disease stage III- stable, creatinine at baseline. -Avoid nephrotoxic agents  Chronic atrial fibrillation- rate-controlled -Continue metoprolol -Not a candidate for anticoagulation  Acute on chronic normocytic anemia- hemoglobin dropped today.  No signs of active bleeding.  -Check anemia panel -Monitor  Chronic tobacco smoking abuse/dependency -Continue nicotine patch and cessation counseling  performed  Moderate malnutrition- seen by dietitian  All the records are reviewed and case discussed with Care Management/Social Worker. Management plans discussed with the patient, family and they are in agreement.  CODE STATUS: Full Code  TOTAL  TIME TAKING CARE OF THIS PATIENT: 40 minutes.   More than 50% of the time was spent in counseling/coordination of care: YES  POSSIBLE D/C in 1-2 DAYS, DEPENDING ON CLINICAL CONDITION.   Berna Spare Mayo M.D on 11/28/2018 at 3:22 PM  Between 7am to 6pm - Pager [phone]  After 6pm go to [email or web address] - Proofreader  [facility]  Office  [phone]  CC: Primary care physician; System, Pcp Not In  Note: This dictation was prepared with Dragon dictation along with smaller phrase technology. Any transcriptional errors that result from this process are unintentional.

## 2018-11-29 LAB — BASIC METABOLIC PANEL
Anion gap: 10 (ref 5–15)
BUN: 26 mg/dL — ABNORMAL HIGH (ref 8–23)
CO2: 20 mmol/L — ABNORMAL LOW (ref 22–32)
Calcium: 8.5 mg/dL — ABNORMAL LOW (ref 8.9–10.3)
Chloride: 106 mmol/L (ref 98–111)
Creatinine, Ser: 1.64 mg/dL — ABNORMAL HIGH (ref 0.44–1.00)
GFR calc Af Amer: 38 mL/min — ABNORMAL LOW (ref >60)
GFR calc non Af Amer: 33 mL/min — ABNORMAL LOW (ref >60)
Glucose, Bld: 92 mg/dL (ref 70–99)
Potassium: 3.7 mmol/L (ref 3.5–5.1)
Sodium: 136 mmol/L (ref 135–145)

## 2018-11-29 LAB — CBC
HCT: 30.8 % — ABNORMAL LOW (ref 36.0–46.0)
Hemoglobin: 10.1 g/dL — ABNORMAL LOW (ref 12.0–15.0)
MCH: 32.2 pg (ref 26.0–34.0)
MCHC: 32.8 g/dL (ref 30.0–36.0)
MCV: 98.1 fL (ref 80.0–100.0)
PLATELETS: 310 10*3/uL (ref 150–400)
RBC: 3.14 MIL/uL — ABNORMAL LOW (ref 3.87–5.11)
RDW: 14.7 % (ref 11.5–15.5)
WBC: 8.7 10*3/uL (ref 4.0–10.5)
nRBC: 0 % (ref 0.0–0.2)

## 2018-11-29 LAB — URINE DRUGS OF ABUSE SCREEN W ALC, ROUTINE (REF LAB)
Amphetamines, Urine: NEGATIVE ng/mL
Barbiturate, Ur: NEGATIVE ng/mL
Benzodiazepine Quant, Ur: NEGATIVE ng/mL
CANNABINOID QUANT UR: NEGATIVE ng/mL
Cocaine (Metab.): NEGATIVE ng/mL
ETHANOL U, QUAN: NEGATIVE %
METHADONE SCREEN, URINE: NEGATIVE ng/mL
Opiate Quant, Ur: NEGATIVE ng/mL
Phencyclidine, Ur: NEGATIVE ng/mL
Propoxyphene, Urine: NEGATIVE ng/mL

## 2018-11-29 NOTE — Care Management (Signed)
RNCM attempted to reach patient's husband to receive patient by EMS- message left for him to call.  EMS packet updated.  No other RNCM needs.

## 2018-11-29 NOTE — Care Management (Signed)
RNCM received call from CSW that APS has called back and located husband and husband called back ready to receive patient. He agrees also to pay privately for home health services.  Corene Cornea with [facility] updated.

## 2018-11-29 NOTE — Care Management (Addendum)
Message left for APS worker Delton [place]irie [phone] to call RNCM with assistance with caregiver arrangement for EMS to bring patient home. RNCM left another message on husband voice mail. Well check requested from [facility] [phone].

## 2018-11-29 NOTE — Progress Notes (Signed)
Clinical Education officer, museum (CSW) received verbal consult from MD that patient's husband [name] stated that he can pay privately for 10 days at a SNF. CSW contacted patient's husband [name] [phone] to discuss the options. Per [name] he can pay $2,500 up front to a SNF for about 10 days. CSW explained that SNFs require 30 days of payment up front if a patient is coming under private pay. Per husband he can't afford to pay for 30 days at a SNF up front. CSW explained that $2,500 could afford a group home for 1 month however patient needs more care then the group home level can provide and no group homes will accept her because of that. Per husband he is at home at address [address] [place], [place] [address] and stated that EMS can transport patient to that address and he will be there. CSW asked if husband can pay out of pocket for home health services and he said he could do that. CSW also made husband aware that APS will follow up with patient at home and arrange in home aides and help apply for medicaid for patient. Husband verbalized his understanding. RN case Freight forwarder, RN and MD aware of above. APS worker Delton [place] is aware of above.   [employer], LCSW [phone]

## 2018-11-29 NOTE — Progress Notes (Signed)
Clinical Education officer, museum (CSW) left APS worker Jazuleline a voicemail making her aware that patient is discharging home today.   [employer], LCSW [phone]

## 2018-11-29 NOTE — Discharge Summary (Signed)
[facility] [place] NAME: [name]    MR#:  [id number]  DATE OF BIRTH:  04-Jul-1956  DATE OF ADMISSION:  11/26/2018   ADMITTING PHYSICIAN: Gorden Harms, MD  DATE OF DISCHARGE: 11/29/18  PRIMARY CARE PHYSICIAN: System, Pcp Not In   ADMISSION DIAGNOSIS:  Urinary tract infection without hematuria, site unspecified [N39.0] Altered mental status, unspecified altered mental status type [R41.82] DISCHARGE DIAGNOSIS:  Active Problems:   Encephalopathy chronic  SECONDARY DIAGNOSIS:   Past Medical History:  Diagnosis Date  . Cancer ([place])    skin  . Depression   . ETOH abuse    HOSPITAL COURSE:   [name] is a 62 year old female who presented to the ED with altered mental status.  On arrival to the ED, she was covered in her own feces and urine was still wearing her hospital gown for admission 1 week ago.  Ammonia level is normal, CT head was unremarkable, UA minimally abnormal.  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  She was admitted for further management.  Acute hepatic encephalopathy- due to med noncompliance.  Husband confirms that she does not take her medications at home.  Patient back to mental status baseline on the day of discharge. -Continue rifaximin and lactulose -APS to provide home health aide in the home -Husband willing to pay for home health PT and RN out-of-pocket to assist with disease management.  Abnormal urine culture- urine culture with 60,000 colonies of yeast.  Likely contaminant. -Initially treated with ceftriaxone, but this was discontinued.  Poor family/social support- came into the ED covered in urine and feces. -APS following -Husband states that he is having a difficult time taking care of her at home -APS to help patient obtain Medicaid.  Patient may be able to be placed in SNF once she gets insurance.  Chronic alcoholicliver cirrhosis with ascites- husband confirms  that patient is noncompliant with meds at home. -Continuemidodrine,rifaximin, spironolactone, empiric daily Cipro for SBP prophylaxis -CIWA -Outpatient palliative care to follow on discharge  Chronic kidney disease stage III- stable, creatinine at baseline.  Chronic atrial fibrillation- rate-controlled -Continuedmetoprolol -Not a candidate for anticoagulation  Acute on chronic normocytic anemia-  No signs of active bleeding.  Hemoglobin close to baseline on the day of discharge. Anemia panel unremarkable this admission.  Chronic tobacco smoking abuse/dependency -Smoking cessation counseling performed  Moderate malnutrition- seen by dietitian this admission.  DISCHARGE CONDITIONS:  Poor social support Chronic alcoholic liver cirrhosis with ascites CKD 3 Chronic atrial fibrillation Acute on chronic normocytic anemia Chronic tobacco abuse Chronic alcohol abuse Moderate malnutrition CONSULTS OBTAINED:  None DRUG ALLERGIES:  No Known Allergies DISCHARGE MEDICATIONS:   Allergies as of 11/29/2018   No Known Allergies     Medication List    TAKE these medications   ascorbic acid 250 MG tablet Commonly known as:  VITAMIN C Take 1 tablet (250 mg total) by mouth 2 (two) times daily.   ciprofloxacin 500 MG tablet Commonly known as:  CIPRO Take 1 tablet (500 mg total) by mouth daily with breakfast for 14 days.   feeding supplement (ENSURE ENLIVE) Liqd Take 237 mLs by mouth 2 (two) times daily between meals.   folic acid 1 MG tablet Commonly known as:  FOLVITE Take 1 tablet (1 mg total) by mouth daily.   lactulose 10 GM/15ML solution Commonly known as:  CHRONULAC Take 45 mLs (30 g total) by mouth 3 (three) times daily.  metoprolol tartrate 25 MG tablet Commonly known as:  LOPRESSOR Take 1 tablet (25 mg total) by mouth 2 (two) times daily.   midodrine 5 MG tablet Commonly known as:  PROAMATINE Take 1 tablet (5 mg total) by mouth 3 (three) times daily with  meals.   multivitamin with minerals Tabs tablet Take 1 tablet by mouth daily.   rifaximin 550 MG Tabs tablet Commonly known as:  XIFAXAN Take 1 tablet (550 mg total) by mouth 2 (two) times daily.   spironolactone 25 MG tablet Commonly known as:  ALDACTONE Take 1 tablet (25 mg total) by mouth daily.   thiamine 100 MG tablet Take 1 tablet (100 mg total) by mouth daily.        DISCHARGE INSTRUCTIONS:  1.  Follow-up with PCP in 5 days 2.  Take all meds as prescribed DIET:  Cardiac diet DISCHARGE CONDITION:  Stable ACTIVITY:  Activity as tolerated OXYGEN:  Home Oxygen: No.  Oxygen Delivery: room air DISCHARGE LOCATION:  home   If you experience worsening of your admission symptoms, develop shortness of breath, life threatening emergency, suicidal or homicidal thoughts you must seek medical attention immediately by calling 911 or calling your MD immediately  if symptoms less severe.  You Must read complete instructions/literature along with all the possible adverse reactions/side effects for all the Medicines you take and that have been prescribed to you. Take any new Medicines after you have completely understood and accpet all the possible adverse reactions/side effects.   Please note  You were cared for by a hospitalist during your hospital stay. If you have any questions about your discharge medications or the care you received while you were in the hospital after you are discharged, you can call the unit and asked to speak with the hospitalist on call if the hospitalist that took care of you is not available. Once you are discharged, your primary care physician will handle any further medical issues. Please note that NO REFILLS for any discharge medications will be authorized once you are discharged, as it is imperative that you return to your primary care physician (or establish a relationship with a primary care physician if you do not have one) for your aftercare needs so  that they can reassess your need for medications and monitor your lab values.    On the day of Discharge:  VITAL SIGNS:  Blood pressure 100/77, pulse 98, temperature (!) 97.5 F (36.4 C), resp. rate 18, height 5\' 9"  (1.753 m), weight 63.5 kg, SpO2 96 %. PHYSICAL EXAMINATION:  GENERAL:  62 y.o.-year-old patient lying in the bed with no acute distress.  EYES: Pupils equal, round, reactive to light and accommodation. No scleral icterus. Extraocular muscles intact.  HEENT: Head atraumatic, normocephalic. Oropharynx and nasopharynx clear.  NECK:  Supple, no jugular venous distention. No thyroid enlargement, no tenderness.  LUNGS: Normal breath sounds bilaterally, no wheezing, rales,rhonchi or crepitation. No use of accessory muscles of respiration.  CARDIOVASCULAR: S1, S2 normal. No murmurs, rubs, or gallops.  ABDOMEN: Soft, non-tender. Bowel sounds present. No organomegaly or mass. + Ascites present on exam. EXTREMITIES: No pedal edema, cyanosis, or clubbing.  NEUROLOGIC: Cranial nerves II through XII are intact. + Global weakness. Sensation intact. Gait not checked.  PSYCHIATRIC: The patient is alert.  Oriented to person. SKIN: No obvious rash, lesion, or ulcer.  DATA REVIEW:   CBC Recent Labs  Lab 11/29/18 0702  WBC 8.7  HGB 10.1*  HCT 30.8*  PLT 310  Chemistries  Recent Labs  Lab 11/26/18 1521  11/29/18 0702  NA 136   < > 136  K 4.4   < > 3.7  CL 102   < > 106  CO2 23   < > 20*  GLUCOSE 90   < > 92  BUN 28*   < > 26*  CREATININE 1.69*   < > 1.64*  CALCIUM 8.6*   < > 8.5*  AST 66*  --   --   ALT 26  --   --   ALKPHOS 98  --   --   BILITOT 1.5*  --   --    < > = values in this interval not displayed.     Microbiology Results  Results for orders placed or performed during the hospital encounter of 11/26/18  Urine Culture     Status: Abnormal   Collection Time: 11/26/18  3:21 PM  Result Value Ref Range Status   Specimen Description   Final    URINE,  RANDOM Performed at [facility], [address] [place], [place] [address]    Special Requests   Final    NONE Performed at [facility], [place],  [address]    Culture 60,000 COLONIES/mL YEAST (A)  Final   Report Status 11/27/2018 FINAL  Final    RADIOLOGY:  No results found.   Management plans discussed with the patient, family and they are in agreement.  CODE STATUS: Full Code   TOTAL TIME TAKING CARE OF THIS PATIENT: 40 minutes.    Berna Spare Lylee Corrow M.D on 11/29/2018 at 10:17 AM  Between 7am to 6pm - Pager - [phone]  After 6pm go to [email or web address] - Proofreader  [facility]  Office  [phone]  CC: Primary care physician; System, Pcp Not In   Note: This dictation was prepared with Dragon dictation along with smaller phrase technology. Any transcriptional errors that result from this process are unintentional.

## 2018-11-29 NOTE — Discharge Instructions (Signed)
It was so nice to meet you during this hospitalization!  You came into the hospital because of confusion. This is likely related to your liver issues and alcohol use.  It is very important that you take the following medications at home to help prevent confusion and fluid buildup: 1. Take Rifaximin twice a day 2. Take Spironolactone once a day 3. Take Lactulose 3 times a day  Take care, -Dr. Brett Albino

## 2018-11-30 NOTE — Progress Notes (Signed)
11/30/18: Clinical Social Worker (Mims) received a call from [place] worker Jazuleline requesting for CSW to fax H&P and D/C summary to her. CSW faxed requested clinicals because patient has an open APS case.   [employer], LCSW [phone]

## 2018-12-05 ENCOUNTER — Inpatient Hospital Stay
Admission: EM | Admit: 2018-12-05 | Discharge: 2018-12-09 | DRG: 689 | Disposition: A | Discharge status: Skilled Nursing Facility | Payer: Self-pay | Attending: Internal Medicine | Admitting: Internal Medicine

## 2018-12-05 ENCOUNTER — Other Ambulatory Visit: Payer: Self-pay

## 2018-12-05 DIAGNOSIS — E860 Dehydration: Secondary | ICD-10-CM | POA: Diagnosis present

## 2018-12-05 DIAGNOSIS — K269 Duodenal ulcer, unspecified as acute or chronic, without hemorrhage or perforation: Secondary | ICD-10-CM

## 2018-12-05 DIAGNOSIS — I10 Essential (primary) hypertension: Secondary | ICD-10-CM | POA: Diagnosis present

## 2018-12-05 DIAGNOSIS — I9589 Other hypotension: Secondary | ICD-10-CM | POA: Diagnosis present

## 2018-12-05 DIAGNOSIS — L8990 Pressure ulcer of unspecified site, unspecified stage: Secondary | ICD-10-CM

## 2018-12-05 DIAGNOSIS — K2100 Gastro-esophageal reflux disease with esophagitis, without bleeding: Secondary | ICD-10-CM

## 2018-12-05 DIAGNOSIS — Z66 Do not resuscitate: Secondary | ICD-10-CM

## 2018-12-05 DIAGNOSIS — D631 Anemia in chronic kidney disease: Secondary | ICD-10-CM | POA: Diagnosis present

## 2018-12-05 DIAGNOSIS — R4182 Altered mental status, unspecified: Secondary | ICD-10-CM | POA: Diagnosis present

## 2018-12-05 DIAGNOSIS — F1010 Alcohol abuse, uncomplicated: Secondary | ICD-10-CM | POA: Diagnosis present

## 2018-12-05 DIAGNOSIS — K264 Chronic or unspecified duodenal ulcer with hemorrhage: Secondary | ICD-10-CM | POA: Diagnosis present

## 2018-12-05 DIAGNOSIS — N179 Acute kidney failure, unspecified: Secondary | ICD-10-CM | POA: Diagnosis present

## 2018-12-05 DIAGNOSIS — D696 Thrombocytopenia, unspecified: Secondary | ICD-10-CM | POA: Diagnosis present

## 2018-12-05 DIAGNOSIS — R531 Weakness: Secondary | ICD-10-CM

## 2018-12-05 DIAGNOSIS — K7031 Alcoholic cirrhosis of liver with ascites: Secondary | ICD-10-CM | POA: Diagnosis present

## 2018-12-05 DIAGNOSIS — K3189 Other diseases of stomach and duodenum: Secondary | ICD-10-CM

## 2018-12-05 DIAGNOSIS — E876 Hypokalemia: Secondary | ICD-10-CM | POA: Diagnosis present

## 2018-12-05 DIAGNOSIS — F329 Major depressive disorder, single episode, unspecified: Secondary | ICD-10-CM | POA: Diagnosis present

## 2018-12-05 DIAGNOSIS — N189 Chronic kidney disease, unspecified: Secondary | ICD-10-CM

## 2018-12-05 DIAGNOSIS — N390 Urinary tract infection, site not specified: Principal | ICD-10-CM | POA: Diagnosis present

## 2018-12-05 DIAGNOSIS — Z515 Encounter for palliative care: Secondary | ICD-10-CM

## 2018-12-05 DIAGNOSIS — K709 Alcoholic liver disease, unspecified: Secondary | ICD-10-CM | POA: Diagnosis present

## 2018-12-05 DIAGNOSIS — R188 Other ascites: Secondary | ICD-10-CM

## 2018-12-05 DIAGNOSIS — K920 Hematemesis: Secondary | ICD-10-CM

## 2018-12-05 DIAGNOSIS — Z79899 Other long term (current) drug therapy: Secondary | ICD-10-CM

## 2018-12-05 DIAGNOSIS — Z9114 Patient's other noncompliance with medication regimen: Secondary | ICD-10-CM

## 2018-12-05 DIAGNOSIS — I959 Hypotension, unspecified: Secondary | ICD-10-CM

## 2018-12-05 DIAGNOSIS — N183 Chronic kidney disease, stage 3 (moderate): Secondary | ICD-10-CM | POA: Diagnosis present

## 2018-12-05 DIAGNOSIS — Z8249 Family history of ischemic heart disease and other diseases of the circulatory system: Secondary | ICD-10-CM

## 2018-12-05 DIAGNOSIS — I480 Paroxysmal atrial fibrillation: Secondary | ICD-10-CM | POA: Diagnosis present

## 2018-12-05 DIAGNOSIS — F17210 Nicotine dependence, cigarettes, uncomplicated: Secondary | ICD-10-CM | POA: Diagnosis present

## 2018-12-05 DIAGNOSIS — K219 Gastro-esophageal reflux disease without esophagitis: Secondary | ICD-10-CM | POA: Diagnosis present

## 2018-12-05 DIAGNOSIS — Z85828 Personal history of other malignant neoplasm of skin: Secondary | ICD-10-CM

## 2018-12-05 HISTORY — DX: Gastro-esophageal reflux disease without esophagitis: K21.9

## 2018-12-05 HISTORY — DX: Paroxysmal atrial fibrillation: I48.0

## 2018-12-05 HISTORY — DX: Alcoholic liver disease, unspecified: K70.9

## 2018-12-05 LAB — COMPREHENSIVE METABOLIC PANEL
ALBUMIN: 3.1 g/dL — AB (ref 3.5–5.0)
ALT: 37 U/L (ref 0–44)
AST: 78 U/L — ABNORMAL HIGH (ref 15–41)
Alkaline Phosphatase: 99 U/L (ref 38–126)
Anion gap: 11 (ref 5–15)
BUN: 34 mg/dL — ABNORMAL HIGH (ref 8–23)
CO2: 20 mmol/L — ABNORMAL LOW (ref 22–32)
Calcium: 8.7 mg/dL — ABNORMAL LOW (ref 8.9–10.3)
Chloride: 102 mmol/L (ref 98–111)
Creatinine, Ser: 2.74 mg/dL — ABNORMAL HIGH (ref 0.44–1.00)
GFR calc Af Amer: 21 mL/min — ABNORMAL LOW (ref >60)
GFR calc non Af Amer: 18 mL/min — ABNORMAL LOW (ref >60)
GLUCOSE: 124 mg/dL — AB (ref 70–99)
Potassium: 4.3 mmol/L (ref 3.5–5.1)
Sodium: 133 mmol/L — ABNORMAL LOW (ref 135–145)
Total Bilirubin: 1.1 mg/dL (ref 0.3–1.2)
Total Protein: 6.6 g/dL (ref 6.5–8.1)

## 2018-12-05 LAB — URINALYSIS, COMPLETE (UACMP) WITH MICROSCOPIC
BACTERIA UA: NONE SEEN
Bilirubin Urine: NEGATIVE
Glucose, UA: NEGATIVE mg/dL
Ketones, ur: NEGATIVE mg/dL
Nitrite: NEGATIVE
Protein, ur: 100 mg/dL — AB
Specific Gravity, Urine: 1.017 (ref 1.005–1.030)
WBC, UA: >50 WBC/hpf — ABNORMAL HIGH (ref 0–5)
pH: 5 (ref 5.0–8.0)

## 2018-12-05 LAB — CBC
HEMATOCRIT: 31.9 % — AB (ref 36.0–46.0)
Hemoglobin: 10.3 g/dL — ABNORMAL LOW (ref 12.0–15.0)
MCH: 31.7 pg (ref 26.0–34.0)
MCHC: 32.3 g/dL (ref 30.0–36.0)
MCV: 98.2 fL (ref 80.0–100.0)
Platelets: 374 10*3/uL (ref 150–400)
RBC: 3.25 MIL/uL — ABNORMAL LOW (ref 3.87–5.11)
RDW: 14.9 % (ref 11.5–15.5)
WBC: 10.8 10*3/uL — ABNORMAL HIGH (ref 4.0–10.5)
nRBC: 0 % (ref 0.0–0.2)

## 2018-12-05 LAB — AMMONIA: AMMONIA: 10 umol/L (ref 9–35)

## 2018-12-05 LAB — LIPASE, BLOOD: Lipase: 38 U/L (ref 11–51)

## 2018-12-05 MED ORDER — SODIUM CHLORIDE 0.9 % IV BOLUS
1000.0000 mL | Freq: Once | INTRAVENOUS | Status: AC
  Administered 2018-12-05: 1000 mL via INTRAVENOUS

## 2018-12-05 MED ORDER — SODIUM CHLORIDE 0.9 % IV SOLN
1.0000 g | Freq: Once | INTRAVENOUS | Status: AC
  Administered 2018-12-05: 1 g via INTRAVENOUS
  Filled 2018-12-05: qty 10

## 2018-12-05 NOTE — ED Notes (Signed)
ED TO INPATIENT HANDOFF REPORT  Name/Age/Gender [name] 62 y.o. female  Code Status Code Status History    Date Active Date Inactive Code Status Order ID Comments User Context   11/26/2018 2121 11/29/2018 1944 Full Code [id number]  Gorden Harms, MD ED   11/09/2018 1951 11/20/2018 1804 Full Code [id number]  [name], MD Inpatient   10/25/2018 0251 10/29/2018 2045 Full Code [id number]  Lance Coon, MD Inpatient   05/19/2018 1958 05/21/2018 1712 Full Code [id number]  Fritzi Mandes, MD Inpatient   11/27/2017 0109 11/29/2017 1844 Full Code [id number]  Gorden Harms, MD Inpatient   09/26/2016 1313 09/27/2016 1802 Full Code [id number]  Dustin Flock, MD ED      Home/SNF/Other  Chief Complaint AMS  Level of Care/Admitting Diagnosis ED Disposition    ED Disposition Condition [place]: [facility] [100120]  Level of Care: Med-Surg [16]  Diagnosis: UTI (urinary tract infection) [599357]  Admitting Physician: Lance Coon [[id number]]  Attending Physician: Lance Coon [phone]  Estimated length of stay: past midnight tomorrow  Certification:: I certify this patient will need inpatient services for at least 2 midnights  PT Class (Do Not Modify): Inpatient [101]  PT Acc Code (Do Not Modify): Private [1]       Medical History Past Medical History:  Diagnosis Date  . Alcoholic liver disease ([place])   . Cancer ([place])    skin  . Depression   . ETOH abuse   . GERD (gastroesophageal reflux disease)   . PAF (paroxysmal atrial fibrillation) ([place])     Allergies No Known Allergies  IV Location/Drains/Wounds Patient Lines/Drains/Airways Status   Active Line/Drains/Airways    Name:   Placement date:   Placement time:   Site:   Days:   Peripheral IV 12/05/18 Left Antecubital   12/05/18    1721    Antecubital   less than 1   Incision (Closed) 11/10/18 Abdomen Right;Lower   11/10/18    -     25          Labs/Imaging Results  for orders placed or performed during the hospital encounter of 12/05/18 (from the past 48 hour(s))  Lipase, blood     Status: None   Collection Time: 12/05/18  5:20 PM  Result Value Ref Range   Lipase 38 11 - 51 U/L    Comment: Performed at [facility], [address] [place], [place] [address]  Comprehensive metabolic panel     Status: Abnormal   Collection Time: 12/05/18  5:20 PM  Result Value Ref Range   Sodium 133 (L) 135 - 145 mmol/L   Potassium 4.3 3.5 - 5.1 mmol/L   Chloride 102 98 - 111 mmol/L   CO2 20 (L) 22 - 32 mmol/L   Glucose, Bld 124 (H) 70 - 99 mg/dL   BUN 34 (H) 8 - 23 mg/dL   Creatinine, Ser 2.74 (H) 0.44 - 1.00 mg/dL   Calcium 8.7 (L) 8.9 - 10.3 mg/dL   Total Protein 6.6 6.5 - 8.1 g/dL   Albumin 3.1 (L) 3.5 - 5.0 g/dL   AST 78 (H) 15 - 41 U/L   ALT 37 0 - 44 U/L   Alkaline Phosphatase 99 38 - 126 U/L   Total Bilirubin 1.1 0.3 - 1.2 mg/dL   GFR calc non Af Amer 18 (L) >60 mL/min   GFR calc Af Amer 21 (L) >60 mL/min   Anion gap 11 5 - 15  Comment: Performed at [facility], [address] [place], [place] [address]  CBC     Status: Abnormal   Collection Time: 12/05/18  5:20 PM  Result Value Ref Range   WBC 10.8 (H) 4.0 - 10.5 K/uL   RBC 3.25 (L) 3.87 - 5.11 MIL/uL   Hemoglobin 10.3 (L) 12.0 - 15.0 g/dL   HCT 31.9 (L) 36.0 - 46.0 %   MCV 98.2 80.0 - 100.0 fL   MCH 31.7 26.0 - 34.0 pg   MCHC 32.3 30.0 - 36.0 g/dL   RDW 14.9 11.5 - 15.5 %   Platelets 374 150 - 400 K/uL   nRBC 0.0 0.0 - 0.2 %    Comment: Performed at [facility], [address] [place], [place] [address]  Ammonia     Status: None   Collection Time: 12/05/18  5:20 PM  Result Value Ref Range   Ammonia 10 9 - 35 umol/L    Comment: Performed at [facility], [address] [place], [place] [address]  Urinalysis, Complete w Microscopic     Status: Abnormal   Collection Time: 12/05/18  7:20 PM  Result Value Ref Range   Color, Urine AMBER (A)  YELLOW    Comment: BIOCHEMICALS MAY BE AFFECTED BY COLOR   APPearance CLOUDY (A) CLEAR   Specific Gravity, Urine 1.017 1.005 - 1.030   pH 5.0 5.0 - 8.0   Glucose, UA NEGATIVE NEGATIVE mg/dL   Hgb urine dipstick MODERATE (A) NEGATIVE   Bilirubin Urine NEGATIVE NEGATIVE   Ketones, ur NEGATIVE NEGATIVE mg/dL   Protein, ur 100 (A) NEGATIVE mg/dL   Nitrite NEGATIVE NEGATIVE   Leukocytes, UA MODERATE (A) NEGATIVE   RBC / HPF 6-10 0 - 5 RBC/hpf   WBC, UA >50 (H) 0 - 5 WBC/hpf   Bacteria, UA NONE SEEN NONE SEEN   Squamous Epithelial / LPF 0-5 0 - 5   WBC Clumps PRESENT    Budding Yeast PRESENT    Hyaline Casts, UA PRESENT     Comment: Performed at [facility], [address] [place], [place] [address]   No results found.  Pending Labs Unresulted Labs (From admission, onward)    Start     Ordered   12/05/18 2018  Urine Culture  Add-on,   AD     12/05/18 2017   Signed and Held  CBC  (heparin)  Once,   R    Comments:  Baseline for heparin therapy IF NOT ALREADY DRAWN.  Notify MD if PLT < 100 K.    Signed and Held   Signed and Held  Creatinine, serum  (heparin)  Once,   R    Comments:  Baseline for heparin therapy IF NOT ALREADY DRAWN.    Signed and Held   Signed and Held  HIV antibody (Routine Testing)  Once,   R     Signed and Held   Signed and Held  Basic metabolic panel  Tomorrow morning,   R     Signed and Held   Signed and Held  CBC  Tomorrow morning,   R     Signed and Held          Vitals/Pain Today's Vitals   12/05/18 2100 12/05/18 2130 12/05/18 2200 12/05/18 2230  BP: 97/83 112/80 96/70 100/72  Pulse: (!) 111 (!) 103 (!) 107 (!) 110  Resp: 17 18 20 20   Temp:      SpO2: 100% 100% 100% 100%  Weight:      PainSc:        Isolation Precautions No active isolations  Medications Medications  cefTRIAXone (ROCEPHIN) 1 g in sodium chloride 0.9 % 100 mL IVPB (0 g Intravenous Stopped 12/05/18 2059)  sodium chloride 0.9 % bolus 1,000 mL (1,000 mLs  Intravenous New Bag/Given 12/05/18 2032)    Mobility

## 2018-12-05 NOTE — ED Notes (Addendum)
Pt had dried stool on back of legs and buttocks. Cleaned with warm wipes. Pt was naked underneath blankets, did not arrive in clothes, arrived wrapped in sheets. Put a clean gown on pt and given new warm blankets. Pt verbalized comfort.

## 2018-12-05 NOTE — ED Triage Notes (Signed)
Per ems covered in feces. Husband called out for increased abd swelling. Seen here 12/8.

## 2018-12-05 NOTE — H&P (Signed)
[place] [facility] [place] NAME: [name]    MR#:  [id number]  DATE OF BIRTH:  1956-10-02  DATE OF ADMISSION:  12/05/2018  PRIMARY CARE PHYSICIAN: System, Pcp Not In   REQUESTING/REFERRING PHYSICIAN: Kerman Passey, MD  CHIEF COMPLAINT:   Chief Complaint  Patient presents with  . Abdominal Pain    HISTORY OF PRESENT ILLNESS:  [name]  is a 63 y.o. female who presents with chief complaint as above.  Patient is confused and is unable to contribute significant information to her HPI.  She is here for the same confusion.  She is found to have a UTI, as well as some acute on chronic renal failure.  Hospitalist called for admission  PAST MEDICAL HISTORY:   Past Medical History:  Diagnosis Date  . Alcoholic liver disease ([place])   . Cancer ([place])    skin  . Depression   . ETOH abuse   . GERD (gastroesophageal reflux disease)   . PAF (paroxysmal atrial fibrillation) ([place])      PAST SURGICAL HISTORY:   Past Surgical History:  Procedure Laterality Date  . MOLE REMOVAL       SOCIAL HISTORY:   Social History   Tobacco Use  . Smoking status: Current Every Day Smoker    Packs/day: 0.25    Types: Cigarettes  . Smokeless tobacco: Never Used  Substance Use Topics  . Alcohol use: Yes    Alcohol/week: 1.0 standard drinks    Types: 1 Shots of liquor per week     FAMILY HISTORY:   Family History  Problem Relation Age of Onset  . Heart disease Mother   . Cancer Father   . Breast cancer Neg Hx      DRUG ALLERGIES:  No Known Allergies  MEDICATIONS AT HOME:   Prior to Admission medications   Medication Sig Start Date End Date Taking? Authorizing Provider  lactulose (CHRONULAC) 10 GM/15ML solution Take 30 g by mouth 3 (three) times daily.   Yes [provider]  metoprolol tartrate (LOPRESSOR) 25 MG tablet Take 25 mg by mouth 2 (two) times daily.   Yes [provider]  midodrine (PROAMATINE) 5 MG  tablet Take 5 mg by mouth 3 (three) times daily with meals.   Yes [provider]  rifaximin (XIFAXAN) 550 MG TABS tablet Take 550 mg by mouth 2 (two) times daily.    Yes [provider]  spironolactone (ALDACTONE) 25 MG tablet Take 25 mg by mouth daily.   Yes [provider]  thiamine 100 MG tablet Take 100 mg by mouth daily.   Yes [provider]    REVIEW OF SYSTEMS:  Review of Systems  Unable to perform ROS: Acuity of condition     VITAL SIGNS:   Vitals:   12/05/18 1930 12/05/18 2002 12/05/18 2030 12/05/18 2100  BP: (!) 87/54 (!) 90/58 105/65 97/83  Pulse: (!) 110 (!) 107  (!) 111  Resp: (!) 24 19 18 17   Temp:      SpO2: 100% 100%  100%  Weight:       Wt Readings from Last 3 Encounters:  12/05/18 63.5 kg  11/29/18 63.5 kg  11/09/18 65.3 kg    PHYSICAL EXAMINATION:  Physical Exam  Vitals reviewed. Constitutional: She appears well-developed and well-nourished. No distress.  HENT:  Head: Normocephalic and atraumatic.  Mouth/Throat: Oropharynx is clear and moist.  Eyes: Pupils are equal, round, and reactive to light. Conjunctivae and  EOM are normal. No scleral icterus.  Neck: Normal range of motion. Neck supple. No JVD present. No thyromegaly present.  Cardiovascular: Normal rate, regular rhythm and intact distal pulses. Exam reveals no gallop and no friction rub.  No murmur heard. Respiratory: Effort normal and breath sounds normal. No respiratory distress. She has no wheezes. She has no rales.  GI: Soft. Bowel sounds are normal. She exhibits no distension. There is no abdominal tenderness.  Musculoskeletal: Normal range of motion.        General: No edema.     Comments: No arthritis, no gout  Lymphadenopathy:    She has no cervical adenopathy.  Neurological: She is alert. No cranial nerve deficit.  No dysarthria, no aphasia  Skin: Skin is warm and dry. No rash noted. No erythema.  Psychiatric:  Unable to fully assess due to  confusion    LABORATORY PANEL:   CBC Recent Labs  Lab 12/05/18 1720  WBC 10.8*  HGB 10.3*  HCT 31.9*  PLT 374   ------------------------------------------------------------------------------------------------------------------  Chemistries  Recent Labs  Lab 12/05/18 1720  NA 133*  K 4.3  CL 102  CO2 20*  GLUCOSE 124*  BUN 34*  CREATININE 2.74*  CALCIUM 8.7*  AST 78*  ALT 37  ALKPHOS 99  BILITOT 1.1   ------------------------------------------------------------------------------------------------------------------  Cardiac Enzymes No results for input(s): TROPONINI in the last 168 hours. ------------------------------------------------------------------------------------------------------------------  RADIOLOGY:  No results found.  EKG:   Orders placed or performed during the hospital encounter of 11/26/18  . EKG 12-Lead  . EKG 12-Lead  . EKG 12-Lead  . EKG 12-Lead  . EKG 12-Lead  . EKG 12-Lead  . EKG 12-Lead  . EKG 12-Lead  . EKG 12-Lead  . EKG 12-Lead    IMPRESSION AND PLAN:  Principal Problem:   UTI (urinary tract infection) -IV antibiotics, gentle IV fluids, urine culture sent Active Problems:   Acute on chronic kidney failure (HCC) -IV fluids as above, avoid nephrotoxins and monitor for expected improvement   PAF (paroxysmal atrial fibrillation) ([place]) -continue home meds   Alcoholic liver disease ([place]) -avoid hepatotoxins, and continue home meds   GERD (gastroesophageal reflux disease) -home dose PPI  Chart review performed and case discussed with ED provider. Labs, imaging and/or ECG reviewed by provider and discussed with patient/family. Management plans discussed with the patient and/or family.  DVT PROPHYLAXIS: SubQ heparin  GI PROPHYLAXIS:  PPI   ADMISSION STATUS: Inpatient     CODE STATUS: Full Code Status History    Date Active Date Inactive Code Status Order ID Comments User Context   11/26/2018 2121 11/29/2018 1944 Full Code  [id number]  SalaryAvel Peace, MD ED   11/09/2018 1951 11/20/2018 1804 Full Code [id number]  [name], MD Inpatient   10/25/2018 0251 10/29/2018 2045 Full Code [id number]  Lance Coon, MD Inpatient   05/19/2018 1958 05/21/2018 1712 Full Code [id number]  Fritzi Mandes, MD Inpatient   11/27/2017 0109 11/29/2017 1844 Full Code [id number]  Salary, Avel Peace, MD Inpatient   09/26/2016 1313 09/27/2016 1802 Full Code [id number]  Dustin Flock, MD ED      TOTAL TIME TAKING CARE OF THIS PATIENT: 45 minutes.   Kynsie Falkner [place] 12/05/2018, 9:49 PM  [employer]  [phone]  CC: Primary care physician; System, Pcp Not In  Note:  This document was prepared using Dragon voice recognition software and may include unintentional dictation errors.

## 2018-12-05 NOTE — ED Notes (Signed)
Pt thinks there is a small child in her room and pt is talking about blue cheese. Pt reoriented that she is being admitted. Pt states she just wants to go home. Pt informed again that she is sick and needs to stay. Pt has forgotten this multiple times.

## 2018-12-05 NOTE — ED Notes (Addendum)
Attempted to call report at this time, unable to give report.

## 2018-12-05 NOTE — ED Provider Notes (Signed)
[facility] Emergency Department Provider Note  Time seen: 6:02 PM  I have reviewed the triage vital signs and the nursing notes.   HISTORY  Chief Complaint Abdominal Pain    HPI [name] is a 62 y.o. female with a past medical history of depression, alcohol abuse, ascites, liver failure, presents to the emergency department via EMS for increased abdominal swelling.  According to report has been who is much older than the patient and lives with the patient called EMS because of abdominal swelling.  EMS states they found the patient to be covered in feces, the patient states she takes lactulose and cannot make it to the restroom in time sometimes.  Here the patient is awake alert oriented x4, denies any complaints.  Admits that she has not been taking her medications as prescribed.  However she denies any abdominal pain.  States her abdomen is always swollen.   Past Medical History:  Diagnosis Date  . Cancer ([place])    skin  . Depression   . ETOH abuse     Patient Active Problem List   Diagnosis Date Noted  . Encephalopathy chronic 11/26/2018  . Weakness generalized   . Encephalopathy   . Palliative care by specialist   . DNR (do not resuscitate) discussion   . Ascites 11/11/2018  . Renal failure 11/09/2018  . Dysthymia 10/26/2018  . Sepsis ([place]) 10/25/2018  . ETOH abuse 10/25/2018  . A-fib ([place]) 11/26/2017  . Malnutrition of moderate degree 09/27/2016  . Acute pancreatitis 09/26/2016    Past Surgical History:  Procedure Laterality Date  . MOLE REMOVAL      Prior to Admission medications   Medication Sig Start Date End Date Taking? Authorizing Provider  ascorbic acid (VITAMIN C) 250 MG tablet Take 1 tablet (250 mg total) by mouth 2 (two) times daily. Patient not taking: Reported on 11/26/2018 11/20/18   [name], MD  ciprofloxacin (CIPRO) 500 MG tablet Take 1 tablet (500 mg total) by mouth daily with breakfast for 14 days. Patient  not taking: Reported on 11/26/2018 11/21/18 12/05/18  [name], MD  feeding supplement, ENSURE ENLIVE, (ENSURE ENLIVE) LIQD Take 237 mLs by mouth 2 (two) times daily between meals. 11/20/18   [name], MD  folic acid (FOLVITE) 1 MG tablet Take 1 tablet (1 mg total) by mouth daily. Patient not taking: Reported on 11/26/2018 11/21/18   [name], MD  lactulose (CHRONULAC) 10 GM/15ML solution Take 45 mLs (30 g total) by mouth 3 (three) times daily. Patient not taking: Reported on 11/26/2018 11/20/18   [name], MD  metoprolol tartrate (LOPRESSOR) 25 MG tablet Take 1 tablet (25 mg total) by mouth 2 (two) times daily. Patient not taking: Reported on 11/26/2018 11/20/18 12/20/18  [name], MD  midodrine (PROAMATINE) 5 MG tablet Take 1 tablet (5 mg total) by mouth 3 (three) times daily with meals. Patient not taking: Reported on 11/26/2018 11/20/18 12/20/18  [name], MD  Multiple Vitamin (MULTIVITAMIN WITH MINERALS) TABS tablet Take 1 tablet by mouth daily. Patient not taking: Reported on 11/26/2018 11/20/18 12/20/18  [name], MD  rifaximin (XIFAXAN) 550 MG TABS tablet Take 1 tablet (550 mg total) by mouth 2 (two) times daily. Patient not taking: Reported on 11/26/2018 11/20/18 12/20/18  [name], MD  spironolactone (ALDACTONE) 25 MG tablet Take 1 tablet (25 mg total) by mouth daily. Patient not taking: Reported on 11/26/2018 11/21/18 12/21/18  [name], MD  thiamine 100 MG tablet Take 1  tablet (100 mg total) by mouth daily. Patient not taking: Reported on 11/26/2018 11/20/18   [name], MD    No Known Allergies  Family History  Problem Relation Age of Onset  . Breast cancer Neg Hx     Social History Social History   Tobacco Use  . Smoking status: Current Every Day Smoker    Packs/day: 0.25    Types: Cigarettes  . Smokeless tobacco: Never Used  Substance Use Topics  . Alcohol use: Yes    Alcohol/week: 1.0 standard drinks    Types: 1 Shots of liquor per  week  . Drug use: No    Review of Systems Constitutional: Negative for fever. Cardiovascular: Negative for chest pain. Respiratory: Negative for shortness of breath. Gastrointestinal: Denies abdominal pain.  States her abdomen is always swollen. Genitourinary: Negative for urinary compaints Musculoskeletal: Negative for musculoskeletal complaints Skin: Negative for skin complaints  Neurological: Negative for headache All other ROS negative  ____________________________________________   PHYSICAL EXAM:  VITAL SIGNS: ED Triage Vitals  Enc Vitals Group     BP 12/05/18 1717 93/69     Pulse Rate 12/05/18 1717 (!) 108     Resp 12/05/18 1717 20     Temp 12/05/18 1717 98.2 F (36.8 C)     Temp src --      SpO2 12/05/18 1717 100 %     Weight 12/05/18 1719 139 lb 15.9 oz (63.5 kg)     Height --      Head Circumference --      Peak Flow --      Pain Score 12/05/18 1718 0     Pain Loc --      Pain Edu? --      Excl. in [place]? --    Constitutional: Alert and oriented. Well appearing and in no distress. Eyes: Normal exam ENT   Head: Normocephalic and atraumatic   Mouth/Throat: Mucous membranes are moist. Cardiovascular: Normal rate, regular rhythm Respiratory: Normal respiratory effort without tachypnea nor retractions. Breath sounds are clear  Gastrointestinal: Mildly swollen abdomen but soft, dull percussion.  Nontender. Musculoskeletal: Nontender with normal range of motion in all extremities. Neurologic:  Normal speech and language. No gross focal neurologic deficits  Skin:  Skin is warm, dry, pale in appearance. Psychiatric: Mood and affect are normal.   ____________________________________________    EKG  EKG viewed and interpreted by myself shows sinus tachycardia 105 bpm with a narrow QRS and a normal axis.  Significant electrical interference unable to interpret adequate intervals.  No obvious ST elevation but again significant electrical interference, has no  chest pain complaint.  ____________________________________________   INITIAL IMPRESSION / ASSESSMENT AND PLAN / ED COURSE  Pertinent labs & imaging results that were available during my care of the patient were reviewed by me and considered in my medical decision making (see chart for details).  Patient presents to the emergency department for evaluation.  Husband apparently called out due to increased abdominal swelling.  Patient denies any pain and states her abdomen is always swollen.  Patient is alert she is oriented x4 she has no complaints at this time.  States she is not sure why she is here.  We will check labs and continue to closely monitor.  Patient's labs are resulted showing acute on chronic renal insufficiency, appears to be dehydrated, blood pressure is low 90/60, we will IV hydrate.  Per report patient's husband was admitted to the hospital earlier, patient was found to be covered  in stool, obviously cannot care for herself in her current condition.  We admit to the hospital service to continue with IV hydration antibiotics.  Patient agreeable to plan of care.  ____________________________________________   FINAL CLINICAL IMPRESSION(S) / ED DIAGNOSES  Weakness Hypertension Dehydration Urinary tract infection   Harvest Dark, MD 12/05/18 2031

## 2018-12-06 ENCOUNTER — Other Ambulatory Visit: Payer: Self-pay

## 2018-12-06 DIAGNOSIS — L8990 Pressure ulcer of unspecified site, unspecified stage: Secondary | ICD-10-CM

## 2018-12-06 LAB — BASIC METABOLIC PANEL
Anion gap: 9 (ref 5–15)
BUN: 34 mg/dL — ABNORMAL HIGH (ref 8–23)
CO2: 18 mmol/L — AB (ref 22–32)
Calcium: 7.6 mg/dL — ABNORMAL LOW (ref 8.9–10.3)
Chloride: 102 mmol/L (ref 98–111)
Creatinine, Ser: 2.75 mg/dL — ABNORMAL HIGH (ref 0.44–1.00)
GFR calc non Af Amer: 18 mL/min — ABNORMAL LOW (ref >60)
GFR, EST AFRICAN AMERICAN: 21 mL/min — AB (ref >60)
Glucose, Bld: 107 mg/dL — ABNORMAL HIGH (ref 70–99)
Potassium: 3.2 mmol/L — ABNORMAL LOW (ref 3.5–5.1)
Sodium: 129 mmol/L — ABNORMAL LOW (ref 135–145)

## 2018-12-06 LAB — CBC
HCT: 26.1 % — ABNORMAL LOW (ref 36.0–46.0)
Hemoglobin: 8.6 g/dL — ABNORMAL LOW (ref 12.0–15.0)
MCH: 32.3 pg (ref 26.0–34.0)
MCHC: 33 g/dL (ref 30.0–36.0)
MCV: 98.1 fL (ref 80.0–100.0)
NRBC: 0 % (ref 0.0–0.2)
Platelets: 302 10*3/uL (ref 150–400)
RBC: 2.66 MIL/uL — ABNORMAL LOW (ref 3.87–5.11)
RDW: 15 % (ref 11.5–15.5)
WBC: 10.8 10*3/uL — ABNORMAL HIGH (ref 4.0–10.5)

## 2018-12-06 MED ORDER — ONDANSETRON HCL 4 MG PO TABS: 4.0000 mg | ORAL_TABLET | Freq: Four times a day (QID) | ORAL | Status: DC | PRN

## 2018-12-06 MED ORDER — SODIUM CHLORIDE 0.9 % IV SOLN
INTRAVENOUS | Status: DC
  Administered 2018-12-06 (×2): via INTRAVENOUS

## 2018-12-06 MED ORDER — ACETAMINOPHEN 325 MG PO TABS: 650.0000 mg | ORAL_TABLET | Freq: Four times a day (QID) | ORAL | Status: DC | PRN

## 2018-12-06 MED ORDER — METOPROLOL TARTRATE 25 MG PO TABS
25.0000 mg | ORAL_TABLET | Freq: Two times a day (BID) | ORAL | Status: DC
  Administered 2018-12-09: 25 mg via ORAL
  Filled 2018-12-06 (×4): qty 1

## 2018-12-06 MED ORDER — POTASSIUM CHLORIDE IN NACL 20-0.9 MEQ/L-% IV SOLN
INTRAVENOUS | Status: DC
  Administered 2018-12-06: 12:00:00 via INTRAVENOUS
  Filled 2018-12-06 (×3): qty 1000

## 2018-12-06 MED ORDER — ENSURE ENLIVE PO LIQD
237.0000 mL | Freq: Two times a day (BID) | ORAL | Status: DC
  Administered 2018-12-08: 237 mL via ORAL

## 2018-12-06 MED ORDER — POTASSIUM CHLORIDE IN NACL 20-0.9 MEQ/L-% IV SOLN: INTRAVENOUS | Status: DC

## 2018-12-06 MED ORDER — VITAMIN B-1 100 MG PO TABS
100.0000 mg | ORAL_TABLET | Freq: Every day | ORAL | Status: DC
  Administered 2018-12-06 – 2018-12-09 (×2): 100 mg via ORAL
  Filled 2018-12-06 (×2): qty 1

## 2018-12-06 MED ORDER — SPIRONOLACTONE 25 MG PO TABS: 25.0000 mg | ORAL_TABLET | Freq: Every day | ORAL | Status: DC

## 2018-12-06 MED ORDER — OCUVITE-LUTEIN PO CAPS
1.0000 | ORAL_CAPSULE | Freq: Every day | ORAL | Status: DC
  Administered 2018-12-09: 1 via ORAL
  Filled 2018-12-06 (×4): qty 1

## 2018-12-06 MED ORDER — MIDODRINE HCL 5 MG PO TABS
5.0000 mg | ORAL_TABLET | Freq: Three times a day (TID) | ORAL | Status: DC
  Administered 2018-12-06 – 2018-12-08 (×3): 5 mg via ORAL
  Filled 2018-12-06 (×3): qty 1

## 2018-12-06 MED ORDER — ACETAMINOPHEN 650 MG RE SUPP: 650.0000 mg | Freq: Four times a day (QID) | RECTAL | Status: DC | PRN

## 2018-12-06 MED ORDER — LACTULOSE 10 GM/15ML PO SOLN
30.0000 g | Freq: Two times a day (BID) | ORAL | Status: DC
  Administered 2018-12-06 – 2018-12-08 (×3): 30 g via ORAL
  Filled 2018-12-06 (×5): qty 60

## 2018-12-06 MED ORDER — POTASSIUM CHLORIDE CRYS ER 20 MEQ PO TBCR
20.0000 meq | EXTENDED_RELEASE_TABLET | Freq: Two times a day (BID) | ORAL | Status: AC
  Administered 2018-12-06 – 2018-12-07 (×2): 20 meq via ORAL
  Filled 2018-12-06 (×3): qty 1

## 2018-12-06 MED ORDER — ONDANSETRON HCL 4 MG/2ML IJ SOLN
4.0000 mg | Freq: Four times a day (QID) | INTRAMUSCULAR | Status: DC | PRN
  Administered 2018-12-06: 4 mg via INTRAVENOUS
  Filled 2018-12-06: qty 2

## 2018-12-06 MED ORDER — HEPARIN SODIUM (PORCINE) 5000 UNIT/ML IJ SOLN
5000.0000 [IU] | Freq: Three times a day (TID) | INTRAMUSCULAR | Status: DC
  Administered 2018-12-06 – 2018-12-07 (×3): 5000 [IU] via SUBCUTANEOUS
  Filled 2018-12-06 (×4): qty 1

## 2018-12-06 MED ORDER — RIFAXIMIN 550 MG PO TABS
550.0000 mg | ORAL_TABLET | Freq: Two times a day (BID) | ORAL | Status: DC
  Administered 2018-12-06 – 2018-12-09 (×5): 550 mg via ORAL
  Filled 2018-12-06 (×6): qty 1

## 2018-12-06 MED ORDER — SODIUM CHLORIDE 0.9 % IV SOLN
1.0000 g | INTRAVENOUS | Status: DC
  Administered 2018-12-06 – 2018-12-08 (×3): 1 g via INTRAVENOUS
  Filled 2018-12-06: qty 1
  Filled 2018-12-06: qty 10
  Filled 2018-12-06 (×2): qty 1

## 2018-12-06 MED ORDER — SODIUM CHLORIDE 0.9 % IV BOLUS
1000.0000 mL | Freq: Once | INTRAVENOUS | Status: AC
  Administered 2018-12-06: 1000 mL via INTRAVENOUS

## 2018-12-06 NOTE — Progress Notes (Addendum)
Notified Dr. Jannifer Franklin that patient's BP is still 80's/60's manually after the bolus. Also per CCMD patient had an 11 beat run of SVT and a 10 beat run of SVT. Dr. Jannifer Franklin feels the monitor is having a hard time capturing the patient as there was a lot of artifact in the ED as well. MD to place orders for another bolus.

## 2018-12-06 NOTE — Progress Notes (Signed)
Please note patient has a pending outpatient Palliative referral from last admission. CMRN Marshell Garfinkel made aware. Flo Shanks RN, BSN, [facility] [place], hospital liaison

## 2018-12-06 NOTE — NC FL2 (Signed)
[place] LEVEL OF CARE SCREENING TOOL     IDENTIFICATION  Patient Name: [name] Birthdate: 1956-05-05 Sex: female Admission Date (Current Location): 12/05/2018  [place] and [place] Number:  Engineering geologist and Address:  [facility], [address] [place], [place] [address]      Provider Number: [id number]  Attending Physician Name and Address:  Henreitta Leber, MD  Relative Name and Phone Number:       Current Level of Care: Hospital Recommended Level of Care: [place] Prior Approval Number:    Date Approved/Denied:   PASRR Number: [id number])  Discharge Plan: SNF    Current Diagnoses: Patient Active Problem List   Diagnosis Date Noted  . UTI (urinary tract infection) 12/05/2018  . Acute on chronic kidney failure ([place]) 12/05/2018  . Alcoholic liver disease ([place]) 12/05/2018  . GERD (gastroesophageal reflux disease) 12/05/2018  . Encephalopathy chronic 11/26/2018  . Weakness generalized   . Encephalopathy   . Palliative care by specialist   . Ascites 11/11/2018  . Renal failure 11/09/2018  . Dysthymia 10/26/2018  . Sepsis ([place]) 10/25/2018  . ETOH abuse 10/25/2018  . PAF (paroxysmal atrial fibrillation) ([place]) 11/26/2017  . Malnutrition of moderate degree 09/27/2016  . Acute pancreatitis 09/26/2016    Orientation RESPIRATION BLADDER Height & Weight     Self  Normal Incontinent Weight: 139 lb 15.9 oz (63.5 kg) Height:  5\' 9"  (175.3 cm)  BEHAVIORAL SYMPTOMS/MOOD NEUROLOGICAL BOWEL NUTRITION STATUS      Incontinent Diet(Diet: Heart Healthy )  AMBULATORY STATUS COMMUNICATION OF NEEDS Skin   Extensive Assist Verbally PU Stage and Appropriate Care(Pressure Ulcer Stage 1 on Buttocks. )                       Personal Care Assistance Level of Assistance  Bathing, Feeding, Dressing Bathing Assistance: Limited assistance Feeding assistance: Limited assistance Dressing Assistance:  Limited assistance     Functional Limitations Info  Sight, Hearing, Speech Sight Info: Adequate Hearing Info: Adequate Speech Info: Adequate    SPECIAL CARE FACTORS FREQUENCY  PT (By licensed PT), OT (By licensed OT)     PT Frequency: (5) OT Frequency: (5)            Contractures      Additional Factors Info  Code Status, Allergies Code Status Info: (Full Code. ) Allergies Info: (No Known Allergies. )           Current Medications (12/06/2018):  This is the current hospital active medication list Current Facility-Administered Medications  Medication Dose Route Frequency Provider Last Rate Last Dose  . 0.9 % NaCl with KCl 20 mEq/ L  infusion   Intravenous Continuous Henreitta Leber, MD      . acetaminophen (TYLENOL) tablet 650 mg  650 mg Oral Q6H PRN Lance Coon, MD       Or  . acetaminophen (TYLENOL) suppository 650 mg  650 mg Rectal Q6H PRN Lance Coon, MD      . cefTRIAXone (ROCEPHIN) 1 g in sodium chloride 0.9 % 100 mL IVPB  1 g Intravenous Q24H Lance Coon, MD      . heparin injection 5,000 Units  5,000 Units Subcutaneous Camelia Phenes Lance Coon, MD   5,000 Units at 12/06/18 [phone]  . lactulose (CHRONULAC) 10 GM/15ML solution 30 g  30 g Oral BID Henreitta Leber, MD   30 g at 12/06/18 1027  . metoprolol tartrate (LOPRESSOR) tablet 25 mg  25 mg Oral BID Henreitta Leber, MD      . midodrine (PROAMATINE) tablet 5 mg  5 mg Oral TID WC Lance Coon, MD   5 mg at 12/06/18 0920  . ondansetron (ZOFRAN) tablet 4 mg  4 mg Oral Q6H PRN Lance Coon, MD       Or  . ondansetron [facility]) injection 4 mg  4 mg Intravenous Q6H PRN Lance Coon, MD      . potassium chloride SA (K-DUR,KLOR-CON) CR tablet 20 mEq  20 mEq Oral BID Henreitta Leber, MD   20 mEq at 12/06/18 1027  . rifaximin (XIFAXAN) tablet 550 mg  550 mg Oral BID Lance Coon, MD   550 mg at 12/06/18 0920  . spironolactone (ALDACTONE) tablet 25 mg  25 mg Oral Daily Henreitta Leber, MD      . thiamine (VITAMIN B-1)  tablet 100 mg  100 mg Oral Daily Henreitta Leber, MD   100 mg at 12/06/18 1028     Discharge Medications: Please see discharge summary for a list of discharge medications.  Relevant Imaging Results:  Relevant Lab Results:   Additional Information (SSN: [id number])  Bernie Ransford, Veronia Beets, LCSW

## 2018-12-06 NOTE — Evaluation (Signed)
Physical Therapy Evaluation Patient Details Name: [name] MRN: [id number] DOB: [birth year] Today's Date: 12/06/2018   History of Present Illness  presented to ER secondary to abdominal pain, AMS; admitted with UTI and acute/chronic renal failure.  Clinical Impression  Upon evaluation, patient alert but oriented to self only; inconsistently follows commands and demonstrates intermittent agitation with encouragement during session.  Generally weak and deconditioned, though difficulty to fully assess strength and ROM due to patient resistance to active effort with activity. Requires hand-over-hand, physical assist for movement of all extremities (with step by step verbal instruction); poor dissociation of trunk/extremities noted.  Currently requiring max assist +2 for rolling, dep assist for incontinent bowel; refused/agitated with further attempts at mobility or therex. Would benefit from skilled PT to address above deficits and promote optimal return to PLOF; recommend transition to STR upon discharge from acute hospitalization, pending ability to participate/progress.     Follow Up Recommendations SNF    Equipment Recommendations       Recommendations for Other Services       Precautions / Restrictions        Mobility  Bed Mobility Overal bed mobility: Needs Assistance Bed Mobility: Rolling Rolling: Max assist;+2 for physical assistance         General bed mobility comments: assist for movement of all extremities and initiation of rolling; poor dissociation of trunk/extremities; resistive to movement at times.  REquires hand-over-hand to initiate and participate with all movement.  Transfers                 General transfer comment: Refused participation with additional mobility during session  Ambulation/Gait             General Gait Details: Refused participation with additional mobility during session  Stairs            Wheelchair  Mobility    Modified Rankin (Stroke Patients Only)       Balance                                             Pertinent Vitals/Pain Pain Assessment: Faces Faces Pain Scale: Hurts even more Pain Location: generalized soreness Pain Descriptors / Indicators: Aching;Grimacing;Guarding Pain Intervention(s): Limited activity within patient's tolerance;Monitored during session;Repositioned    Home Living Family/patient expects to be discharged to:: Private residence Living Arrangements: Spouse/significant other Available Help at Discharge: Family Type of Home: Apartment Home Access: Level entry     Home Layout: One level Home Equipment: Bedside commode;Cane - single point;Tub bench;Hand held shower head;Walker - 2 wheels Additional Comments: mult falls in recent history    Prior Function           Comments: Patient poor historian; unable/unwilling to participate with detailed interview.  Per notes, limited OOB mobility in recent weeks.     Hand Dominance        Extremity/Trunk Assessment   Upper Extremity Assessment Upper Extremity Assessment: Generalized weakness(grossly 3-/5 throughout, resistive to act assist from therapist despite encouragement)    Lower Extremity Assessment Lower Extremity Assessment: Generalized weakness(grossly 3-/5 throughout, resistive to act assist from therapist despite encouragement.  L ankle lacking neutral DF (difficulty to formally measure, as patient actively resisting at times))       Communication   Communication: No difficulties  Cognition Arousal/Alertness: Awake/alert Behavior During Therapy: Flat affect Overall Cognitive Status: No family/caregiver present to  determine baseline cognitive functioning                                 General Comments: oriented  to self only; inconsistently follows commands and participates with session; easily agitated, but redirectable      General Comments       Exercises Other Exercises Other Exercises: Rolling bilat, max assist +2; hand-over-hand assist required.  Poor dissociation of trunk/extremities   Assessment/Plan    PT Assessment Patient needs continued PT services  PT Problem List Decreased strength;Decreased range of motion;Decreased activity tolerance;Decreased balance;Pain;Decreased mobility;Decreased coordination;Decreased cognition;Decreased knowledge of use of DME;Decreased safety awareness       PT Treatment Interventions DME instruction;Gait training;Stair training;Functional mobility training;Balance training;Therapeutic exercise;Therapeutic activities;Neuromuscular re-education;Patient/family education;Cognitive remediation    PT Goals (Current goals can be found in the Care Plan section)  Acute Rehab PT Goals Patient Stated Goal: to leave the hospital PT Goal Formulation: With patient Time For Goal Achievement: 12/20/18 Potential to Achieve Goals: Fair Additional Goals Additional Goal #1: Assess and establish goals for OOB activities and mobility as appropriate.    Frequency Min 2X/week   Barriers to discharge Decreased caregiver support      Co-evaluation               AM-PAC PT "6 Clicks" Mobility  Outcome Measure Help needed turning from your back to your side while in a flat bed without using bedrails?: A Lot Help needed moving from lying on your back to sitting on the side of a flat bed without using bedrails?: A Lot Help needed moving to and from a bed to a chair (including a wheelchair)?: Total Help needed standing up from a chair using your arms (e.g., wheelchair or bedside chair)?: Total Help needed to walk in hospital room?: Total Help needed climbing 3-5 steps with a railing? : Total 6 Click Score: 8    End of Session   Activity Tolerance: Treatment limited secondary to agitation Patient left: in bed;with call bell/phone within reach;with bed alarm set Nurse Communication: Mobility  status PT Visit Diagnosis: Muscle weakness (generalized) (M62.81);History of falling (Z91.81);Other abnormalities of gait and mobility (R26.89);Difficulty in walking, not elsewhere classified (R26.2)    Time: 9242-6834 PT Time Calculation (min) (ACUTE ONLY): 19 min   Charges:   PT Evaluation $PT Eval Moderate Complexity: 1 Mod PT Treatments $Therapeutic Activity: 8-22 mins        Yanisa Goodgame H. Owens Shark, PT, DPT, NCS 12/06/18, 12:01 PM [phone]

## 2018-12-06 NOTE — Clinical Social Work Placement (Signed)
   CLINICAL SOCIAL WORK PLACEMENT  NOTE  Date:  12/06/2018  Patient Details  Name: [name] MRN: [id number] Date of Birth: Jul 13, 1956  Clinical Social Work is seeking post-discharge placement for this patient at the [place] level of care (*CSW will initial, date and re-position this form in  chart as items are completed):  Yes   Patient/family provided with [place] Work Department's list of facilities offering this level of care within the geographic area requested by the patient (or if unable, by the patient's family).  Yes   Patient/family informed of their freedom to choose among providers that offer the needed level of care, that participate in Medicare, Medicaid or managed care program needed by the patient, have an available bed and are willing to accept the patient.  Yes   Patient/family informed of [facility]'s ownership interest in [facility] and [facility], as well as of the fact that they are under no obligation to receive care at these facilities.  PASRR submitted to EDS on       PASRR number received on       Existing PASRR number confirmed on 12/06/18     FL2 transmitted to all facilities in geographic area requested by pt/family on 12/06/18     FL2 transmitted to all facilities within larger geographic area on 12/06/18     Patient informed that his/her managed care company has contracts with or will negotiate with certain facilities, including the following:        Yes   Patient/family informed of bed offers received.  Patient chooses bed at [facility])     Physician recommends and patient chooses bed at      Patient to be transferred to   on  .  Patient to be transferred to facility by       Patient family notified on   of transfer.  Name of family member notified:        PHYSICIAN       Additional Comment:    _______________________________________________ Katryna Tschirhart, Veronia Beets,  LCSW 12/06/2018, 4:42 PM

## 2018-12-06 NOTE — Clinical Social Work Note (Signed)
Clinical Social Work Assessment  Patient Details  Name: [name] MRN: [id number] Date of Birth: [birth year]  Date of referral:  12/06/18               Reason for consult:  Facility Placement                Permission sought to share information with:  [name] granted to share information::  Yes, Verbal Permission Granted  Name::      [name]::   [name]  Relationship::     Contact Information:     Housing/Transportation Living arrangements for the past 2 months:  [name] of Information:  Spouse, Other (Comment Required)(APS ) Patient Interpreter Needed:  None Criminal Activity/Legal Involvement Pertinent to Current Situation/Hospitalization:  No - Comment as needed Significant Relationships:  Spouse Lives with:  Spouse Do you feel safe going back to the place where you live?    Need for family participation in patient care:  Yes (Comment)  Care giving concerns:  Patient [place] in [name].    Social Worker assessment / plan:  Holiday representative ([place]) received call from [name] [facility] worker [name] [phone] on 12/05/18 after 5 pm stating that she is sending both patient and her husband [name] to the ED from home. Per APS worker patient's husband had feces on him and patient was not getting her needs met. Per APS worker patient has a pending medicaid application that APS is assisting with. Per APS patient is confused so her husband [name] is the decision maker for patient. APS stated that patient and her husband will need to be placed in a SNF. Patient has no insurance and has a pending Insurance underwriter. CSW lead approved 30 day LOG for patient with pending medicaid application. FL2 complete and faxed out.   Patient has 2 bed offers [name] and [name]. Patient's husband also had bed offers from  those facilities. CSW and 1C CSW met with patient's husband [name] while he was in room 118 and made him aware of above. CSW explained to husband that these are only bed offers for patient. Husband accepted bed offers from [name] for patient and himself. Per husband he wants to be in the same room as patient at [name]. Patient's husband understands that patient will not get PT at [name] and is going to live there long term. Per husband his goal is to rehab and get home from [name]. CSW contacted [name] at [facility] and made her aware of above. APS worker is aware of above.  Plan is for patient to D/C to [name] in [name] when medically stable. CSW will continue to follow and assist as needed.   Employment status:  Disabled (Comment on whether or not currently receiving Disability) Insurance information:  Self Pay (Medicaid Pending) PT Recommendations:  [name] / Referral to community resources:  [place]  Patient/Family's Response to care:  Patient's husband has chosen [name] in [place].   Patient/Family's Understanding of and Emotional Response to Diagnosis, Current Treatment, and Prognosis:  Patient's husband was very pleasant and thanked CSW for assistance.   Emotional Assessment Appearance:  Appears stated age Attitude/Demeanor/Rapport:  Unable to Assess Affect (typically observed):  Unable to Assess Orientation:  Oriented to Self, Fluctuating Orientation (Suspected and/or reported Sundowners) Alcohol / Substance  use:  Not Applicable Psych involvement (Current and /or in the community):  No (Comment)  Discharge Needs  Concerns to be addressed:  Discharge Planning Concerns Readmission within the last 30 days:  No Current discharge risk:  Dependent with Mobility, Cognitively Impaired, Chronically ill Barriers to Discharge:  Continued Medical Work  up   [employer], Veronia Beets, LCSW 12/06/2018, 4:43 PM

## 2018-12-06 NOTE — Care Management (Signed)
This patient has recent history of APS referral, home palliative, and referral [facility] under private pay (as patient doesn't have health insurance but income is more than charity allows).  I'm told that patient's husband is now in the hospital.  CSW consult placed to see if there are any options.  RNCM will reach out to [facility] for status update.

## 2018-12-06 NOTE — Progress Notes (Signed)
Initial Nutrition Assessment  DOCUMENTATION CODES:   Non-severe (moderate) malnutrition in context of chronic illness  INTERVENTION:   At risk for refeeding; monitor labs daily  Ensure Enlive po BID, each supplement provides 350 kcal and 20 grams of protein (strawberry)  Magic cup TID with meals, each supplement provides 290 kcal and 9 grams of protein (vanilla)  MVI   NUTRITION DIAGNOSIS:   Moderate Malnutrition related to chronic illness, social / environmental circumstances(chronic EtOH abuse/cirrhosis; poor living conditions) as evidenced by moderate muscle depletion, moderate fat depletion.    GOAL:   Patient will meet greater than or equal to 90% of their needs  MONITOR:   PO intake, Supplement acceptance, Labs, I & O's, Weight trends  REASON FOR ASSESSMENT:   Malnutrition Screening Tool    ASSESSMENT:  62 year old patient with recent [place] discharges on 12/11 and 12/02 with history significant for hepatic encephalopathy secondary to alcoholism, chronic Wernicke's encephalopathy, liver cirrhosis, ascites, Afib, acute pancreatitis returning to ED with complaints of abdominal pain and found to have UTI, acute renal failure   Patient with altered mental status at visit. Unable to obtain history or dietary intake. RD note from previous week remains appropriate.   Previous RD note from last week recalls patient is poor historian of dietary recall. Pt likes snacking on peanuts and was drinking strawberry ensure.  Medications reviewed and include:  Lactulose, potassium chloride 39mEq tablet, thiamine  Labs: Na 129 (L) replacing K 3.2 (L) replacing BUN 34 (H) Cr 2.75 (H) Ca 7.6 (L) w/out albumin level for correction     NUTRITION - FOCUSED PHYSICAL EXAM:    Diet Order:   Diet Order            Diet Heart Room service appropriate? Yes; Fluid consistency: Thin  Diet effective now              EDUCATION NEEDS:   Not appropriate for education at this  time  Skin:  Skin Assessment: Reviewed RN Assessment(stage 1; buttocks)  Last BM:  12/18; Type 6, Med; Brown  Height:   Ht Readings from Last 1 Encounters:  12/06/18 5\' 9"  (1.753 m)    Weight:   Wt Readings from Last 1 Encounters:  12/05/18 63.5 kg    Ideal Body Weight:  65.9 kg  BMI:  Body mass index is 20.67 kg/m.  Estimated Nutritional Needs:   Kcal:  6433-2951 (MSJ 1.4-1.6)  Protein:  76-95g/day  Fluid:  1.6-1.9L/day    Lajuan Lines, RD, LDN  After Hours/Weekend Pager: [phone]

## 2018-12-06 NOTE — Progress Notes (Signed)
[facility] [place] NAME: [name]    MR#:  [id number]  DATE OF BIRTH:  1956-10-28  SUBJECTIVE:   Patient presented to the hospital due to vague abdominal pain and weakness and suspected to have a UTI.  Patient has underlying chronic liver disease secondary to alcohol abuse.  She is somewhat confused this morning.  REVIEW OF SYSTEMS:    Review of Systems  Constitutional: Negative for chills and fever.  HENT: Negative for congestion and tinnitus.   Eyes: Negative for blurred vision and double vision.  Respiratory: Negative for cough, shortness of breath and wheezing.   Cardiovascular: Negative for chest pain, orthopnea and PND.  Gastrointestinal: Negative for abdominal pain, diarrhea, nausea and vomiting.  Genitourinary: Negative for dysuria and hematuria.  Neurological: Positive for weakness (generalized). Negative for dizziness, sensory change and focal weakness.  All other systems reviewed and are negative.   Nutrition: Heart healthy Tolerating Diet: Yes Tolerating PT: Eval noted.   DRUG ALLERGIES:  No Known Allergies  VITALS:  Blood pressure (!) 89/62, pulse 98, temperature 97.7 F (36.5 C), temperature source Oral, resp. rate 16, height 5\' 9"  (1.753 m), weight 63.5 kg, SpO2 97 %.  PHYSICAL EXAMINATION:   Physical Exam  GENERAL:  62 y.o.-year-old patient lying in bed in no acute distress.  EYES: Pupils equal, round, reactive to light and accommodation. + scleral icterus. Extraocular muscles intact. Pale Conjunctiva.  HEENT: Head atraumatic, normocephalic. Oropharynx and nasopharynx clear.  NECK:  Supple, no jugular venous distention. No thyroid enlargement, no tenderness.  LUNGS: Normal breath sounds bilaterally, no wheezing, rales, rhonchi. No use of accessory muscles of respiration.  CARDIOVASCULAR: S1, S2 normal. No murmurs, rubs, or gallops.  ABDOMEN: Soft, nontender, distended, + fluid wave due to ascites. Bowel  sounds present. No organomegaly or mass.  EXTREMITIES: No cyanosis, clubbing, +1-2 edema b/l.   NEUROLOGIC: Cranial nerves II through XII are intact. No focal Motor or sensory deficits b/l.  Globally weak.     PSYCHIATRIC: The patient is alert and oriented x 3.  SKIN: No obvious rash, lesion, or ulcer.    LABORATORY PANEL:   CBC Recent Labs  Lab 12/06/18 0410  WBC 10.8*  HGB 8.6*  HCT 26.1*  PLT 302   ------------------------------------------------------------------------------------------------------------------  Chemistries  Recent Labs  Lab 12/05/18 1720 12/06/18 0410  NA 133* 129*  K 4.3 3.2*  CL 102 102  CO2 20* 18*  GLUCOSE 124* 107*  BUN 34* 34*  CREATININE 2.74* 2.75*  CALCIUM 8.7* 7.6*  AST 78*  --   ALT 37  --   ALKPHOS 99  --   BILITOT 1.1  --    ------------------------------------------------------------------------------------------------------------------  Cardiac Enzymes No results for input(s): TROPONINI in the last 168 hours. ------------------------------------------------------------------------------------------------------------------  RADIOLOGY:  No results found.   ASSESSMENT AND PLAN:   62 year old female with past medical history of chronic liver disease secondary to alcohol abuse, paroxysmal atrial fibrillation, anxiety/depression, recurrent hospitalizations who presented to the hospital due to weakness, altered mental status.  1.  Altered mental status- etiology unclear.  No evidence of hepatic encephalopathy as Ammonia level is normal.  -Possibly due to underlying acute on chronic kidney injury and also mild dehydration and suspected UTI. - We will hydrate the patient with IV fluids, continue IV ceftriaxone for the UTI, follow clinically.  2.  Urinary tract infection-based off a urinalysis on admission.  Her previous admission patient urinalysis was positive but her urine  cultures grew out yeast which was a contaminant. -Continue  IV ceftriaxone for now, follow urine cultures.  3.  Acute on chronic renal failure- secondary to dehydration/relative hypotension. -Baseline creatinine is around 1.5 and patient presented to the hospital with creatinine of 2.7. - Continue gentle IV fluids, follow BUN and creatinine and urine output.  Renal dose meds, avoid nephrotoxins.  4.  Alcoholic liver cirrhosis-clinically evidence of hepatic encephalopathy.  Continue lactulose, Xifaxan. - Continue Aldactone.  Prognosis is quite poor and therefore will get palliative care consult to discuss goals of care.  5.  Chronic hypotension-secondary to chronic liver disease.  Continue midodrine.  6.  Hypokalemia-we will continue oral and IV potassium supplementation.  Repeat level in the morning.  7.  History of paroxysmal atrial fibrillation- rate controlled.  Continue metoprolol. -Not on long-term anticoagulation given thrombocytopenia and high risk of bleeding.     All the records are reviewed and case discussed with Care Management/Social Worker. Management plans discussed with the patient, family and they are in agreement.  CODE STATUS: Full code  DVT Prophylaxis: Hep SQ  TOTAL TIME TAKING CARE OF THIS PATIENT: 35 minutes.   POSSIBLE D/C IN 2-3 DAYS, DEPENDING ON CLINICAL CONDITION.   Henreitta Leber M.D on 12/06/2018 at 1:50 PM  Between 7am to 6pm - Pager - [phone]  After 6pm go to [email or web address] - Proofreader  [facility]  Office  [phone]  CC: Primary care physician; System, Pcp Not In

## 2018-12-07 ENCOUNTER — Inpatient Hospital Stay: Payer: Self-pay

## 2018-12-07 DIAGNOSIS — I959 Hypotension, unspecified: Secondary | ICD-10-CM

## 2018-12-07 DIAGNOSIS — Z515 Encounter for palliative care: Secondary | ICD-10-CM

## 2018-12-07 DIAGNOSIS — N183 Chronic kidney disease, stage 3 (moderate): Secondary | ICD-10-CM

## 2018-12-07 DIAGNOSIS — K709 Alcoholic liver disease, unspecified: Secondary | ICD-10-CM

## 2018-12-07 DIAGNOSIS — Z66 Do not resuscitate: Secondary | ICD-10-CM

## 2018-12-07 DIAGNOSIS — K920 Hematemesis: Secondary | ICD-10-CM

## 2018-12-07 DIAGNOSIS — N170 Acute kidney failure with tubular necrosis: Secondary | ICD-10-CM

## 2018-12-07 LAB — BODY FLUID CELL COUNT WITH DIFFERENTIAL
Eos, Fluid: 0 %
LYMPHS FL: 16 %
Monocyte-Macrophage-Serous Fluid: 55 %
Neutrophil Count, Fluid: 29 %
Other Cells, Fluid: 0 %
Total Nucleated Cell Count, Fluid: 157 cu mm

## 2018-12-07 LAB — CBC
HCT: 31.5 % — ABNORMAL LOW (ref 36.0–46.0)
Hemoglobin: 10.2 g/dL — ABNORMAL LOW (ref 12.0–15.0)
MCH: 32.1 pg (ref 26.0–34.0)
MCHC: 32.4 g/dL (ref 30.0–36.0)
MCV: 99.1 fL (ref 80.0–100.0)
PLATELETS: 331 10*3/uL (ref 150–400)
RBC: 3.18 MIL/uL — ABNORMAL LOW (ref 3.87–5.11)
RDW: 15 % (ref 11.5–15.5)
WBC: 14.9 10*3/uL — ABNORMAL HIGH (ref 4.0–10.5)
nRBC: 0 % (ref 0.0–0.2)

## 2018-12-07 LAB — PROTEIN, PLEURAL OR PERITONEAL FLUID: Total protein, fluid: <3 g/dL

## 2018-12-07 LAB — ALBUMIN, PLEURAL OR PERITONEAL FLUID: Albumin, Fluid: <1 g/dL

## 2018-12-07 LAB — COMPREHENSIVE METABOLIC PANEL
ALT: 32 U/L (ref 0–44)
AST: 76 U/L — ABNORMAL HIGH (ref 15–41)
Albumin: 2.6 g/dL — ABNORMAL LOW (ref 3.5–5.0)
Alkaline Phosphatase: 91 U/L (ref 38–126)
Anion gap: 10 (ref 5–15)
BUN: 32 mg/dL — ABNORMAL HIGH (ref 8–23)
CO2: 17 mmol/L — ABNORMAL LOW (ref 22–32)
Calcium: 8.1 mg/dL — ABNORMAL LOW (ref 8.9–10.3)
Chloride: 107 mmol/L (ref 98–111)
Creatinine, Ser: 3.04 mg/dL — ABNORMAL HIGH (ref 0.44–1.00)
GFR calc Af Amer: 18 mL/min — ABNORMAL LOW (ref >60)
GFR, EST NON AFRICAN AMERICAN: 16 mL/min — AB (ref >60)
Glucose, Bld: 90 mg/dL (ref 70–99)
Potassium: 4 mmol/L (ref 3.5–5.1)
Sodium: 134 mmol/L — ABNORMAL LOW (ref 135–145)
Total Bilirubin: 1 mg/dL (ref 0.3–1.2)
Total Protein: 5.7 g/dL — ABNORMAL LOW (ref 6.5–8.1)

## 2018-12-07 LAB — PATHOLOGIST SMEAR REVIEW

## 2018-12-07 LAB — HIV ANTIBODY (ROUTINE TESTING W REFLEX): HIV SCREEN 4TH GENERATION: NONREACTIVE

## 2018-12-07 LAB — MAGNESIUM: Magnesium: 1.3 mg/dL — ABNORMAL LOW (ref 1.7–2.4)

## 2018-12-07 MED ORDER — PANTOPRAZOLE SODIUM 40 MG IV SOLR: 40.0000 mg | Freq: Two times a day (BID) | INTRAVENOUS | Status: DC

## 2018-12-07 MED ORDER — SODIUM CHLORIDE 0.9 % IV SOLN
80.0000 mg | Freq: Once | INTRAVENOUS | Status: AC
  Administered 2018-12-07: 80 mg via INTRAVENOUS
  Filled 2018-12-07: qty 80

## 2018-12-07 MED ORDER — ALBUMIN HUMAN 25 % IV SOLN
25.0000 g | Freq: Once | INTRAVENOUS | Status: AC
  Administered 2018-12-07: 25 g via INTRAVENOUS
  Filled 2018-12-07: qty 100

## 2018-12-07 MED ORDER — LACTATED RINGERS IV SOLN
INTRAVENOUS | Status: AC
  Administered 2018-12-07: 06:00:00 via INTRAVENOUS

## 2018-12-07 MED ORDER — SODIUM CHLORIDE 0.9 % IV SOLN
8.0000 mg/h | INTRAVENOUS | Status: DC
  Administered 2018-12-07: 8 mg/h via INTRAVENOUS
  Filled 2018-12-07: qty 80

## 2018-12-07 NOTE — Progress Notes (Signed)
Patient had not urinated tonight. MD notified. Orders received to In/ out cath patient

## 2018-12-07 NOTE — Progress Notes (Signed)
Per MD patient is not stable for D/C today. Plan is for patient and her husband [name] to D/C to [facility] in [place] when medically stable. Patient's husband [name] will sign patient's admission paper work for [name]. [name] at [name] is aware of above.   [employer], LCSW [phone]

## 2018-12-07 NOTE — Progress Notes (Signed)
PT Cancellation Note  Patient Details Name: [name] MRN: [id number] DOB: 11/10/1956   Cancelled Treatment:    Reason Eval/Treat Not Completed: Patient at procedure or test/unavailable   Attempted x 2 this AM.  Pt out of room for testing.  Will continue as appropriate.   Chesley Noon 12/07/2018, 1:21 PM

## 2018-12-07 NOTE — Progress Notes (Signed)
[facility] [place] NAME: [name]    MR#:  [id number]  DATE OF BIRTH:  08-Feb-1956  SUBJECTIVE:   Patient's abdomen was somewhat distended this morning, she remains somewhat confused.  Patient had some coffee-ground emesis..  No other acute events overnight.  REVIEW OF SYSTEMS:    Review of Systems  Unable to perform ROS: Mental acuity   Nutrition: Heart healthy Tolerating Diet: Yes Tolerating PT: Eval noted.   DRUG ALLERGIES:  No Known Allergies  VITALS:  Blood pressure (!) 90/54, pulse (!) 102, temperature 97.6 F (36.4 C), temperature source Oral, resp. rate 17, height 5\' 9"  (1.753 m), weight 63.5 kg, SpO2 99 %.  PHYSICAL EXAMINATION:   Physical Exam  GENERAL:  62 y.o.-year-old patient lying in bed in no acute distress.  EYES: Pupils equal, round, reactive to light and accommodation. + scleral icterus. Extraocular muscles intact. Pale Conjunctiva.  HEENT: Head atraumatic, normocephalic. Oropharynx and nasopharynx clear.  NECK:  Supple, no jugular venous distention. No thyroid enlargement, no tenderness.  LUNGS: Normal breath sounds bilaterally, no wheezing, rales, rhonchi. No use of accessory muscles of respiration.  CARDIOVASCULAR: S1, S2 normal. No murmurs, rubs, or gallops.  ABDOMEN: Soft, nontender, distended, + fluid wave due to ascites. Bowel sounds present. No organomegaly or mass.  EXTREMITIES: No cyanosis, clubbing, +1-2 edema b/l.   NEUROLOGIC: Cranial nerves II through XII are intact. No focal Motor or sensory deficits b/l.  Globally weak.     PSYCHIATRIC: The patient is alert and oriented x 1.  SKIN: No obvious rash, lesion, or ulcer.    LABORATORY PANEL:   CBC Recent Labs  Lab 12/07/18 0441  WBC 14.9*  HGB 10.2*  HCT 31.5*  PLT 331   ------------------------------------------------------------------------------------------------------------------  Chemistries  Recent Labs  Lab 12/07/18 0441   NA 134*  K 4.0  CL 107  CO2 17*  GLUCOSE 90  BUN 32*  CREATININE 3.04*  CALCIUM 8.1*  MG 1.3*  AST 76*  ALT 32  ALKPHOS 91  BILITOT 1.0   ------------------------------------------------------------------------------------------------------------------  Cardiac Enzymes No results for input(s): TROPONINI in the last 168 hours. 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 She presents for paracentesis. EXAM: ULTRASOUND GUIDED  PARACENTESIS MEDICATIONS: None. COMPLICATIONS: None immediate. PROCEDURE: Informed written consent was obtained from the patient after a discussion of the risks, benefits and alternatives to treatment. A timeout was performed prior to the initiation of the procedure. Initial ultrasound scanning demonstrates a large amount of ascites within the right lower abdominal quadrant. The right lower abdomen was prepped and draped in the usual sterile fashion. 1% lidocaine with epinephrine was used for local anesthesia. Following this, a 6 Fr Safe-T-Centesis catheter was introduced. An ultrasound image was saved for documentation purposes. The paracentesis was performed. The catheter was removed and a dressing was applied. The patient tolerated the procedure well without immediate post procedural complication. FINDINGS: A total of approximately 6700 mL of clear yellow fluid was removed. Samples were sent to the laboratory if requested by the clinical team. IMPRESSION: Successful ultrasound-guided paracentesis yielding 6.7 liters of peritoneal fluid. Electronically Signed   By: Jacqulynn Cadet M.D.   On: 12/07/2018 13:40     ASSESSMENT AND PLAN:   62 year old female with past medical history of chronic liver disease secondary to alcohol abuse,  paroxysmal atrial fibrillation, anxiety/depression, recurrent hospitalizations who presented to  the hospital due to weakness, altered mental status.  1.  Altered mental status- etiology unclear.  No evidence of hepatic encephalopathy as Ammonia level is normal.  -Possibly due to underlying acute on chronic kidney injury and also mild dehydration and suspected UTI. -Continue gentle IV fluids, IV ceftriaxone for the UTI.  Urine cultures remain negative so far. -Follow mental status.  2.  Urinary tract infection-based off a urinalysis on admission.  Her previous admission patient urinalysis was positive but her urine cultures grew out yeast which was a contaminant. -Continue IV ceftriaxone for now, follow urine cultures.  3.  GI bleed-patient developed some coffee-ground emesis this morning after coming back from paracentesis. - Patient is currently n.p.o. now, will get gastroenterology consult (Discussed with Dr. Bonna Gains).  Started on IV Protonix drip.  Follow hemoglobin.  4.  Acute on chronic renal failure- secondary to dehydration/relative hypotension. -Baseline creatinine is around 1.5 and patient Cr. Today up to 3.04.  - ?? Related to GI bleed/hepatorenal.  Cont. Gentle fluids, s/p US guided Paracentesis with 6 L of fluid removed and will give some albumin today and follow BUN/Cr.  -  Renal dose meds, avoid nephrotoxins.  5.  Alcoholic liver cirrhosis-clinically No evidence of hepatic encephalopathy.  Continue lactulose, Xifaxan. -Status post ultrasound-guided paracentesis with 6 L of fluid removed.  Now having coffee-ground emesis and started on Protonix drip, await gastroenterology input. -Prognosis is quite poor and appreciate palliative care input and patient is a DNR now.  6.  Chronic hypotension-secondary to chronic liver disease.  Continue midodrine.  7.  Hypokalemia- improved with supplementation and will cont. To monitor.   8.  History of paroxysmal atrial fibrillation- rate  controlled.  Continue metoprolol. -Not on long-term anticoagulation given thrombocytopenia and high risk of bleeding.     All the records are reviewed and case discussed with Care Management/Social Worker. Management plans discussed with the patient, family and they are in agreement.  CODE STATUS: DNR  DVT Prophylaxis: Ted's & SCD's.   TOTAL TIME TAKING CARE OF THIS PATIENT: 40 minutes.   POSSIBLE D/C IN 2-3 DAYS, DEPENDING ON CLINICAL CONDITION.   Henreitta Leber M.D on 12/07/2018 at 1:52 PM  Between 7am to 6pm - Pager - [phone]  After 6pm go to [email or web address] - Proofreader  [facility]  Office  [phone]  CC: Primary care physician; System, Pcp Not In

## 2018-12-07 NOTE — Consult Note (Signed)
Consultation Note Date: 12/07/2018   Patient Name: [name]  DOB: May 02, 1956  MRN: [id number]  Age / Sex: 62 y.o., female  PCP: System, Pcp Not In Referring Physician: Henreitta Leber, MD  Reason for Consultation: Establishing goals of care and Psychosocial/spiritual support  HPI/Patient Profile: 62 y.o. female  with past medical history of alcoholic cirrhosis, atrial fibrillation, CKD (baseline 1.5) and depression who was admitted on 12/05/2018 with altered mental status. From Epic notes I understand that both she and her husband Dub Mikes were brought in from home in poor condition.  APS is involved.  Currently the patient is receiving paracentesis.  Per bedside RN the patient remains intermittently confused.  She is eating very little.  She has had frequent admissions.  At the time of admission her creatinine was 2.7 it is currently trending up and her CO2 is trending down.  She remains hypotensive despite midodrine.  Today she had an episode of coffee ground emesis.  Per bedside RN she has not been out of bed and refuses to work with physical therapy.  Clinical Assessment and Goals of Care:  I have reviewed medical records including EPIC notes, labs and imaging, received report from her bedside RN, Social work, and the patient's step daughter Ashok Croon, assessed the patient and then met with her husband Dub Mikes (who is also admitted).  APS has concluded that Dub Mikes is competent to make medical decisions for his wife Jadalee.    I introduced Palliative Medicine as specialized medical care for people living with serious illness. It focuses on providing relief from the symptoms and stress of a serious illness. The goal is to improve quality of life for both the patient and the family.  We discussed a brief life review of the patient. When I spoke with Jinny Blossom she explained that she and Jaclyn Shaggy do not know  Jilliane well and they are not appropriate to be involved in her care.  Per Derin Matthes family is in [place] and uninvolved.    I spoke with Dub Mikes.  He and Kolina were married August of 2019.  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  He told me how they met and described their dating.  Dub Mikes tells me that he is saddened as Kerstie still thinks he is her boyfriend.  She is frequently confused.  He states that for the past three weeks at home he has been too weak to walk to the bedroom and Tanga has been too weak to walk to the front room where he was on the couch.    We discussed her current illness and what it means in the larger context of her on-going co-morbidities.  I attempted to elicit values and goals of care important to the patient.   The difference between aggressive medical intervention and comfort care was considered in light of the patient's goals of care.   Advanced directives, concepts specific to code status, and rehospitalization were considered and discussed.  Dub Mikes felt that if Corean's heart  stopped and she stopped breathing she would not want resuscitation.  He requested DNR as a protective measure for Nelva.  His daughter Jinny Blossom was present during our conversation.  Dub Mikes was very caring and concerned about Temika.  He requested that we keep him updated regarding her health.  Questions and concerns were addressed.  The family was encouraged to call with questions or concerns.   Primary Decision Maker:  Husband, Dub Mikes    SUMMARY OF RECOMMENDATIONS    Code status changed to DNR.  Full scope treatment otherwise. PMT will continue to follow with you.  If she improves we anticipate d/c to [facility].  Consider warmth / heating pad to abdomen for pain.  She is on PPI therapy.  Code Status/Advance Care Planning:  Changed to DNR   Symptom Management:   Per primary team   Prognosis:  Difficult to determine.  If her kidney function continues to decline  her prognosis is likely poor (days to weeks).    If her kidney function improves she likely has months.  Given her concurrent liver and kidney failure along with afib and current GIB she is at high risk for acute decline and death.    Discharge Planning: [facility] for rehab with Palliative care service follow-up      Primary Diagnoses: Present on Admission: . PAF (paroxysmal atrial fibrillation) ([facility]) . UTI (urinary tract infection) . Acute on chronic kidney failure ([place]) . Alcoholic liver disease ([place]) . GERD (gastroesophageal reflux disease)   I have reviewed the medical record, interviewed the patient and family, and examined the patient. The following aspects are pertinent.  Past Medical History:  Diagnosis Date  . Alcoholic liver disease ([place])   . Cancer ([place])    skin  . Depression   . ETOH abuse   . GERD (gastroesophageal reflux disease)   . PAF (paroxysmal atrial fibrillation) ([facility])    Social History   Socioeconomic History  . Marital status: Married    Spouse name: Not on file  . Number of children: Not on file  . Years of education: Not on file  . Highest education level: Not on file  Occupational History  . Not on file  Social Needs  . Financial resource strain: Not on file  . Food insecurity:    Worry: Not on file    Inability: Not on file  . Transportation needs:    Medical: Not on file    Non-medical: Not on file  Tobacco Use  . Smoking status: Current Every Day Smoker    Packs/day: 0.25    Types: Cigarettes  . Smokeless tobacco: Never Used  Substance and Sexual Activity  . Alcohol use: Yes    Alcohol/week: 1.0 standard drinks    Types: 1 Shots of liquor per week  . Drug use: No  . Sexual activity: Never  Lifestyle  . Physical activity:    Days per week: Not on file    Minutes per session: Not on file  . Stress: Not on file  Relationships  . Social connections:    Talks on phone: Not on file    Gets together: Not on file      Attends religious service: Not on file    Active member of club or organization: Not on file    Attends meetings of clubs or organizations: Not on file    Relationship status: Not on file  Other Topics Concern  . Not on file  Social History Narrative  .  Not on file   Family History  Problem Relation Age of Onset  . Heart disease Mother   . Cancer Father   . Breast cancer Neg Hx    Scheduled Meds: . feeding supplement (ENSURE ENLIVE)  237 mL Oral BID BM  . heparin  5,000 Units Subcutaneous Q8H  . lactulose  30 g Oral BID  . metoprolol tartrate  25 mg Oral BID  . midodrine  5 mg Oral TID WC  . multivitamin-lutein  1 capsule Oral Daily  . potassium chloride  20 mEq Oral BID  . rifaximin  550 mg Oral BID  . spironolactone  25 mg Oral Daily  . thiamine  100 mg Oral Daily   Continuous Infusions: . cefTRIAXone (ROCEPHIN)  IV 1 g (12/06/18 2138)  . lactated ringers 125 mL/hr at 12/07/18 0549   PRN Meds:.acetaminophen **OR** acetaminophen, ondansetron **OR** ondansetron (ZOFRAN) IV No Known Allergies Review of Systems complains of abdominal pain, poor appetite.  Husband complains she is frequently confused.  Physical Exam  Pale frail female who appears much older than stated age.  Awake, alert, orientated. CV difficult to hear but no m/r/g resp no distress, no w/c/r   Vital Signs: BP 110/69 (BP Location: Left Arm)   Pulse (!) 107   Temp 97.6 F (36.4 C) (Oral)   Resp 17   Ht '5\' 9"'  (1.753 m)   Wt 63.5 kg   SpO2 99%   BMI 20.67 kg/m  Pain Scale: 0-10   Pain Score: 0-No pain   SpO2: SpO2: 99 % O2 Device:SpO2: 99 % O2 Flow Rate: .   IO: Intake/output summary:   Intake/Output Summary (Last 24 hours) at 12/07/2018 1145 Last data filed at 12/07/2018 0900 Gross per 24 hour  Intake 167.88 ml  Output 150 ml  Net 17.88 ml    LBM: Last BM Date: 12/06/18 Baseline Weight: Weight: 63.5 kg Most recent weight: Weight: 63.5 kg     Palliative Assessment/Data:  30%   Flowsheet Rows     Most Recent Value  Intake Tab  Referral Department  Hospitalist  Unit at Time of Referral  Med/Surg Unit  Date Notified  12/06/18  Palliative Care Type  Return patient Palliative Care  Reason for referral  Clarify Goals of Care  Date of Admission  12/05/18  # of days IP prior to Palliative referral  1  Clinical Assessment  Psychosocial & Spiritual Assessment  Palliative Care Outcomes      Time In: 11:00 Time Out: 12:10 Time Total: 70 min. Greater than 50%  of this time was spent counseling and coordinating care related to the above assessment and plan.  Signed by: Florentina Jenny, PA-C Palliative Medicine Pager: [phone]  Please contact Palliative Medicine Team phone at [phone] for questions and concerns.  For individual provider: See Shea Evans

## 2018-12-07 NOTE — Consult Note (Signed)
Vonda Antigua, MD [address] [place], [place], [place], [address] [address], [place], [name], [place], [address] Phone: [phone]  Fax: [phone]  Consultation  Referring Provider:     Dr. Verdell Carmine Primary Care Physician:  System, Pcp Not In Reason for Consultation:     Coffee-ground emesis  Date of Admission:  12/05/2018 Date of Consultation:  12/07/2018         HPI:   [name] is a 62 y.o. female with alcoholic liver cirrhosis and GI consulted for coffee-ground emesis.  Patient reports one episode of emesis this morning and states it was brown in color. Hemoglobin is 10.2 this morning and it was 8.6 yesterday.  Patient is hemodynamically stable.  Admitted with altered mental status with previous history of multiple admissions for the same.  On my evaluation today, patient is alert and oriented x3, and is appropriately conversive.   Previously her altered mental status has improved after she gets lactulose and rifaximin as an inpatient.  Question possible noncompliance to medications as an outpatient.  Underwent paracentesis today with 6.7 L removed.  Body fluid cell count not consistent with SBP.  She is also being treated for possible UTI on this admission.    Past Medical History:  Diagnosis Date  . Alcoholic liver disease ([place])   . Cancer ([place])    skin  . Depression   . ETOH abuse   . GERD (gastroesophageal reflux disease)   . PAF (paroxysmal atrial fibrillation) ([place])     Past Surgical History:  Procedure Laterality Date  . MOLE REMOVAL      Prior to Admission medications   Medication Sig Start Date End Date Taking? Authorizing Provider  lactulose (CHRONULAC) 10 GM/15ML solution Take 30 g by mouth 3 (three) times daily.   Yes [provider]  metoprolol tartrate (LOPRESSOR) 25 MG tablet Take 25 mg by mouth 2 (two) times daily.   Yes [provider]  midodrine (PROAMATINE) 5 MG tablet Take 5 mg by mouth 3 (three)  times daily with meals.   Yes [provider]  rifaximin (XIFAXAN) 550 MG TABS tablet Take 550 mg by mouth 2 (two) times daily.    Yes [provider]  spironolactone (ALDACTONE) 25 MG tablet Take 25 mg by mouth daily.   Yes [provider]  thiamine 100 MG tablet Take 100 mg by mouth daily.   Yes [provider]    Family History  Problem Relation Age of Onset  . Heart disease Mother   . Cancer Father   . Breast cancer Neg Hx      Social History   Tobacco Use  . Smoking status: Current Every Day Smoker    Packs/day: 0.25    Types: Cigarettes  . Smokeless tobacco: Never Used  Substance Use Topics  . Alcohol use: Yes    Alcohol/week: 1.0 standard drinks    Types: 1 Shots of liquor per week  . Drug use: No    Allergies as of 12/05/2018  . (No Known Allergies)    Review of Systems:    All systems reviewed and negative except where noted in HPI.   Physical Exam:  Vital signs in last 24 hours: Vitals:   12/07/18 1127 12/07/18 1150 12/07/18 1156 12/07/18 1215  BP: 110/69 (!) 88/54 (!) 90/54 (!) 90/54  Pulse: (!) 107 99 100 (!) 102  Resp:      Temp:      TempSrc:  SpO2: 99% 97% 97% 99%  Weight:      Height:       Last BM Date: 12/06/18 General:   Pleasant, cooperative in NAD Head:  Normocephalic and atraumatic. Eyes:   No icterus.   Conjunctiva pink. PERRLA. Ears:  Normal auditory acuity. Neck:  Supple; no masses or thyroidomegaly Lungs: Respirations even and unlabored. Lungs clear to auscultation bilaterally.   No wheezes, crackles, or rhonchi.  Abdomen:  Soft, nondistended, nontender. Normal bowel sounds. No appreciable masses or hepatomegaly.  No rebound or guarding.  Neurologic:  Alert and oriented x3;  grossly normal neurologically. Skin:  Intact without significant lesions or rashes. Cervical Nodes:  No significant cervical adenopathy. Psych:  Alert and cooperative. Normal affect.  LAB RESULTS: Recent Labs     12/05/18 1720 12/06/18 0410 12/07/18 0441  WBC 10.8* 10.8* 14.9*  HGB 10.3* 8.6* 10.2*  HCT 31.9* 26.1* 31.5*  PLT 374 302 331   BMET Recent Labs    12/05/18 1720 12/06/18 0410 12/07/18 0441  NA 133* 129* 134*  K 4.3 3.2* 4.0  CL 102 102 107  CO2 20* 18* 17*  GLUCOSE 124* 107* 90  BUN 34* 34* 32*  CREATININE 2.74* 2.75* 3.04*  CALCIUM 8.7* 7.6* 8.1*   LFT Recent Labs    12/07/18 0441  PROT 5.7*  ALBUMIN 2.6*  AST 76*  ALT 32  ALKPHOS 91  BILITOT 1.0   PT/INR No results for input(s): LABPROT, INR in the last 72 hours.  STUDIES: US Paracentesis  Result Date: 12/07/2018 INDICATION: 62 year old female with recurrent large volume ascites secondary to alcoholic cirrhosis. She presents for paracentesis. EXAM: ULTRASOUND GUIDED  PARACENTESIS MEDICATIONS: None. COMPLICATIONS: None immediate. PROCEDURE: Informed written consent was obtained from the patient after a discussion of the risks, benefits and alternatives to treatment. A timeout was performed prior to the initiation of the procedure. Initial ultrasound scanning demonstrates a large amount of ascites within the right lower abdominal quadrant. The right lower abdomen was prepped and draped in the usual sterile fashion. 1% lidocaine with epinephrine was used for local anesthesia. Following this, a 6 Fr Safe-T-Centesis catheter was introduced. An ultrasound image was saved for documentation purposes. The paracentesis was performed. The catheter was removed and a dressing was applied. The patient tolerated the procedure well without immediate post procedural complication. FINDINGS: A total of approximately 6700 mL of clear yellow fluid was removed. Samples were sent to the laboratory if requested by the clinical team. IMPRESSION: Successful ultrasound-guided paracentesis yielding 6.7 liters of peritoneal fluid. Electronically Signed   By: Jacqulynn Cadet M.D.   On: 12/07/2018 13:40      Impression / Plan:   [name] is a 62 y.o. y/o female with history of alcohol abuse and alcoholic cirrhosis with ascites admitted with altered mental status with GI consulted for coffee-ground emesis  Coffee-ground emesis is nonspecific for GI bleed Coffee-ground appearance can be due to stomach contents as well Given her hemodynamic stability and stable hemoglobin unlikely that she is having a GI bleed No clinical evidence of variceal bleeding at this time No prior EGDs Reasonable to start patient on PPI IV twice daily at this time  I do not see that her hemoglobin has been repeated since this morning.  Would recommend repeating hemoglobin to see if it is the same or if it has dropped.  If hemoglobin is dropping or patient continues to have any further episodes of coffee-ground emesis or evidence of GI bleeding, EGD  can be considered today or tomorrow.  Patient ate solid food today, therefore EGD can have aspiration risk and will not be done today N.p.o. past midnight  No signs of SBP on fluid studies Follow-up pending fluid culture  Continue rifaximin and titrate lactulose to no more than 2-3 bowel movements a day  Folate and thiamine Encourage abstinence CIWA protocol  Thank you for involving me in the care of this patient.      LOS: 2 days   Virgel Manifold, MD  12/07/2018, 2:00 PM

## 2018-12-07 NOTE — Progress Notes (Signed)
After in/out cath patient, only got 100cc out.. Abdomen firm and taut, ascites.macular degeneration notified. Orders to infuse 1L lR

## 2018-12-08 ENCOUNTER — Inpatient Hospital Stay: Payer: Self-pay | Admitting: Anesthesiology

## 2018-12-08 ENCOUNTER — Encounter
Admission: EM | Disposition: A | Discharge status: Skilled Nursing Facility | Payer: Self-pay | Source: Home / Self Care | Attending: Specialist

## 2018-12-08 ENCOUNTER — Encounter: Payer: Self-pay | Admitting: Anesthesiology

## 2018-12-08 DIAGNOSIS — K2100 Gastro-esophageal reflux disease with esophagitis, without bleeding: Secondary | ICD-10-CM

## 2018-12-08 DIAGNOSIS — K269 Duodenal ulcer, unspecified as acute or chronic, without hemorrhage or perforation: Secondary | ICD-10-CM

## 2018-12-08 DIAGNOSIS — K3189 Other diseases of stomach and duodenum: Secondary | ICD-10-CM

## 2018-12-08 DIAGNOSIS — K920 Hematemesis: Secondary | ICD-10-CM

## 2018-12-08 HISTORY — PX: ESOPHAGOGASTRODUODENOSCOPY: SHX5428

## 2018-12-08 LAB — COMPREHENSIVE METABOLIC PANEL
ALBUMIN: 2.4 g/dL — AB (ref 3.5–5.0)
ALT: 23 U/L (ref 0–44)
AST: 50 U/L — ABNORMAL HIGH (ref 15–41)
Alkaline Phosphatase: 67 U/L (ref 38–126)
Anion gap: 9 (ref 5–15)
BUN: 35 mg/dL — ABNORMAL HIGH (ref 8–23)
CHLORIDE: 108 mmol/L (ref 98–111)
CO2: 18 mmol/L — ABNORMAL LOW (ref 22–32)
Calcium: 7.9 mg/dL — ABNORMAL LOW (ref 8.9–10.3)
Creatinine, Ser: 2.99 mg/dL — ABNORMAL HIGH (ref 0.44–1.00)
GFR calc Af Amer: 19 mL/min — ABNORMAL LOW (ref >60)
GFR calc non Af Amer: 16 mL/min — ABNORMAL LOW (ref >60)
Glucose, Bld: 93 mg/dL (ref 70–99)
Potassium: 3.7 mmol/L (ref 3.5–5.1)
SODIUM: 135 mmol/L (ref 135–145)
Total Bilirubin: 0.9 mg/dL (ref 0.3–1.2)
Total Protein: 5 g/dL — ABNORMAL LOW (ref 6.5–8.1)

## 2018-12-08 LAB — PH, BODY FLUID: pH, Body Fluid: 7.6

## 2018-12-08 LAB — CBC
HCT: 26.3 % — ABNORMAL LOW (ref 36.0–46.0)
Hemoglobin: 8.6 g/dL — ABNORMAL LOW (ref 12.0–15.0)
MCH: 31.7 pg (ref 26.0–34.0)
MCHC: 32.7 g/dL (ref 30.0–36.0)
MCV: 97 fL (ref 80.0–100.0)
Platelets: 228 10*3/uL (ref 150–400)
RBC: 2.71 MIL/uL — AB (ref 3.87–5.11)
RDW: 15 % (ref 11.5–15.5)
WBC: 9.9 10*3/uL (ref 4.0–10.5)
nRBC: 0 % (ref 0.0–0.2)

## 2018-12-08 LAB — AMMONIA: Ammonia: 23 umol/L (ref 9–35)

## 2018-12-08 LAB — PROTEIN, BODY FLUID (OTHER): Total Protein, Body Fluid Other: 1.8 g/dL

## 2018-12-08 SURGERY — EGD (ESOPHAGOGASTRODUODENOSCOPY)
Anesthesia: General

## 2018-12-08 MED ORDER — PANTOPRAZOLE SODIUM 40 MG IV SOLR
40.0000 mg | Freq: Two times a day (BID) | INTRAVENOUS | Status: DC
  Administered 2018-12-08 (×2): 40 mg via INTRAVENOUS
  Filled 2018-12-08 (×2): qty 40

## 2018-12-08 MED ORDER — ZINC OXIDE 40 % EX OINT
TOPICAL_OINTMENT | Freq: Two times a day (BID) | CUTANEOUS | Status: DC
  Administered 2018-12-08 – 2018-12-09 (×2): via TOPICAL
  Filled 2018-12-08: qty 113

## 2018-12-08 MED ORDER — SODIUM CHLORIDE 0.9 % IV SOLN
INTRAVENOUS | Status: DC
  Administered 2018-12-08: 10:00:00 via INTRAVENOUS

## 2018-12-08 MED ORDER — PROPOFOL 500 MG/50ML IV EMUL
INTRAVENOUS | Status: DC | PRN
  Administered 2018-12-08: 140 ug/kg/min via INTRAVENOUS

## 2018-12-08 NOTE — Progress Notes (Signed)
[facility], [place] 12/08/18  Subjective:   Results for [name], [name] (MRN [id number]) as of 12/08/2018 14:17  Ref. Range 11/29/2018 07:02 12/05/2018 17:20 12/06/2018 04:10 12/07/2018 04:41 12/08/2018 04:49  Creatinine Latest Ref Range: 0.44 - 1.00 mg/dL 1.64 (H) 2.74 (H) 2.75 (H) 3.04 (H) 2.99 (H)   Patient known to our practice from previous admission.  Previously followed for hyponatremia and AKI. This time she was brought in from home for increasing abdominal swelling.  At the time of admission in the ER, she was noted to be covered in feces.  At present, patient is alert and able to answer few simple questions but then has tangential thoughts.  Some things do not make sense for example [place] to [place] which I will and or what the conversation is about.  Objective:  Vital signs in last 24 hours:  Temp:  [97 F (36.1 C)-97.7 F (36.5 C)] 97 F (36.1 C) (12/20 1025) Pulse Rate:  [84-105] 89 (12/20 1055) Resp:  [18-20] 18 (12/20 1055) BP: (82-92)/(54-65) 85/65 (12/20 1055) SpO2:  [99 %-100 %] 99 % (12/20 1055)  Weight change:  Filed Weights   12/05/18 1719  Weight: 63.5 kg    Intake/Output:    Intake/Output Summary (Last 24 hours) at 12/08/2018 1417 Last data filed at 12/08/2018 1025 Gross per 24 hour  Intake 554.14 ml  Output 1 ml  Net 553.14 ml     Physical Exam: General:  No acute distress, laying in the bed  HEENT  moist oral mucous membranes  Neck  supple  Pulm/lungs  normal breathing effort, clear  CVS/Heart  no rub  Abdomen:   Soft,   Extremities:  No edema  Neurologic:  Oriented to self and place  Skin:  No acute rashes    Basic Metabolic Panel:  Recent Labs  Lab 12/05/18 1720 12/06/18 0410 12/07/18 0441 12/08/18 0449  NA 133* 129* 134* 135  K 4.3 3.2* 4.0 3.7  CL 102 102 107 108  CO2 20* 18* 17* 18*  GLUCOSE 124* 107* 90 93  BUN 34* 34* 32* 35*  CREATININE  2.74* 2.75* 3.04* 2.99*  CALCIUM 8.7* 7.6* 8.1* 7.9*  MG  --   --  1.3*  --      CBC: Recent Labs  Lab 12/05/18 1720 12/06/18 0410 12/07/18 0441 12/08/18 0449  WBC 10.8* 10.8* 14.9* 9.9  HGB 10.3* 8.6* 10.2* 8.6*  HCT 31.9* 26.1* 31.5* 26.3*  MCV 98.2 98.1 99.1 97.0  PLT 374 302 331 228     No results found for: HEPBSAG, HEPBSAB, HEPBIGM    Microbiology:  Recent Results (from the past 240 hour(s))  Urine Culture     Status: Abnormal (Preliminary result)   Collection Time: 12/05/18  7:20 PM  Result Value Ref Range Status   Specimen Description   Final    URINE, RANDOM Performed at [facility], [address] [place], [place] [address]    Special Requests   Final    NONE Performed at [facility], [address] [place], [place] [address]    Culture (A)  Final    >=100,000 COLONIES/mL UNIDENTIFIED ORGANISM Performed at [place] [address] [place], [place] [address]    Report Status PENDING  Incomplete  Body fluid culture     Status: None (Preliminary result)   Collection Time: 12/07/18 12:05 PM  Result Value Ref Range Status  Specimen Description   Final    PERITONEAL Performed at [facility], [address] [place], [place] [address]    Special Requests   Final    NONE Performed at [facility], [place], [place] [address]    Gram Stain   Final    RARE WBC PRESENT,BOTH PMN AND MONONUCLEAR NO ORGANISMS SEEN    Culture   Final    NO GROWTH < 24 HOURS Performed at [facility], [place] [address] [place], [place] [address]    Report Status PENDING  Incomplete    Coagulation Studies: No results for input(s): LABPROT, INR in the last 72 hours.  Urinalysis: Recent Labs    12/05/18 1920  COLORURINE AMBER*  LABSPEC 1.017  PHURINE 5.0  GLUCOSEU NEGATIVE  HGBUR MODERATE*  BILIRUBINUR NEGATIVE  KETONESUR NEGATIVE  PROTEINUR 100*  NITRITE NEGATIVE  LEUKOCYTESUR MODERATE*       Imaging: US Paracentesis  Result Date: 12/07/2018 INDICATION: 62 year old female with recurrent large volume ascites secondary to alcoholic cirrhosis. She presents for paracentesis. EXAM: ULTRASOUND GUIDED  PARACENTESIS MEDICATIONS: None. COMPLICATIONS: None immediate. PROCEDURE: Informed written consent was obtained from the patient after a discussion of the risks, benefits and alternatives to treatment. A timeout was performed prior to the initiation of the procedure. Initial ultrasound scanning demonstrates a large amount of ascites within the right lower abdominal quadrant. The right lower abdomen was prepped and draped in the usual sterile fashion. 1% lidocaine with epinephrine was used for local anesthesia. Following this, a 6 Fr Safe-T-Centesis catheter was introduced. An ultrasound image was saved for documentation purposes. The paracentesis was performed. The catheter was removed and a dressing was applied. The patient tolerated the procedure well without immediate post procedural complication. FINDINGS: A total of approximately 6700 mL of clear yellow fluid was removed. Samples were sent to the laboratory if requested by the clinical team. IMPRESSION: Successful ultrasound-guided paracentesis yielding 6.7 liters of peritoneal fluid. Electronically Signed   By: Jacqulynn Cadet M.D.   On: 12/07/2018 13:40     Medications:   . cefTRIAXone (ROCEPHIN)  IV 1 g (12/07/18 2216)   . feeding supplement (ENSURE ENLIVE)  237 mL Oral BID BM  . lactulose  30 g Oral BID  . metoprolol tartrate  25 mg Oral BID  . midodrine  5 mg Oral TID WC  . multivitamin-lutein  1 capsule Oral Daily  . pantoprazole  40 mg Intravenous Q12H  . rifaximin  550 mg Oral BID  . thiamine  100 mg Oral Daily   acetaminophen **OR** acetaminophen, ondansetron **OR** ondansetron (ZOFRAN) IV  Assessment/ Plan:  62 y.o. Caucasian female with hepatic cirrhosis, atrial fibrillation, EtOH, depression  1.  Acute kidney  injury 2.  Chronic kidney disease stage III.?  Baseline creatinine 1.56/GFR 35 from December 9 3.  Urinary tract infection 4.  Altered mental status/confusion 5.  Cirrhosis  Cause for acute kidney injury is not clear but may be related to UTI and hypotension.  Patient has documented blood pressure of 75/60 on December 17.  Today's creatinine has started to improve.  She is currently getting antibiotics.  Urine culture is growing greater than 100,000 unidentified organisms.  Plan: Continue treatment of UTI as you are doing Hemodynamic support, continue midodrine. Avoid hypotension. We will follow   LOS: [place] 12/20/20192:17 PM  [place], [place]  Note: This note was prepared with Dragon dictation. Any transcription errors are unintentional

## 2018-12-08 NOTE — Progress Notes (Signed)
Dana Antigua, MD [address] [place], [place], [place], [address] [address], [place], [place], [place], [address] Phone: [phone]  Fax: [phone]   Subjective:  Patient denies any further episodes of vomiting since the one yesterday morning.  No melena or hematochezia.  No abdominal pain.  Patient alert and oriented x3 this morning.  Objective: Exam: Vital signs in last 24 hours: Vitals:   12/07/18 2241 12/07/18 2245 12/08/18 0820 12/08/18 0923  BP: (!) 82/54 (!) 90/58 (!) 92/55 (!) 86/57  Pulse: 93  (!) 105 90  Resp: 19   18  Temp: (!) 97.5 F (36.4 C)  97.6 F (36.4 C) 97.7 F (36.5 C)  TempSrc: Oral  Oral Oral  SpO2: 100%  100% 100%  Weight:      Height:       Weight change:   Intake/Output Summary (Last 24 hours) at 12/08/2018 0941 Last data filed at 12/08/2018 4680 Gross per 24 hour  Intake 304.14 ml  Output 1 ml  Net 303.14 ml    General: No acute distress, AAO x3 Abd: Soft, NT/ND, No HSM Skin: Warm, no rashes Neck: Supple, Trachea midline   Lab Results: Lab Results  Component Value Date   WBC 9.9 12/08/2018   HGB 8.6 (L) 12/08/2018   HCT 26.3 (L) 12/08/2018   MCV 97.0 12/08/2018   PLT 228 12/08/2018   Micro Results: Recent Results (from the past 240 hour(s))  Urine Culture     Status: Abnormal (Preliminary result)   Collection Time: 12/05/18  7:20 PM  Result Value Ref Range Status   Specimen Description   Final    URINE, RANDOM Performed at [facility], [address], [place], [place] [address]    Special Requests   Final    NONE Performed at [facility], [address], [place], [place] [address]    Culture (A)  Final    >=100,000 COLONIES/mL UNIDENTIFIED ORGANISM Performed at [facility], [place] [address], [place], [place] [address]    Report Status PENDING  Incomplete  Body fluid culture     Status: None (Preliminary result)   Collection Time: 12/07/18 12:05 PM  Result Value Ref  Range Status   Specimen Description   Final    PERITONEAL Performed at [facility], [address] [place], [place] [address]    Special Requests   Final    NONE Performed at [facility], [address] [place], [place] [address]    Gram Stain   Final    RARE WBC PRESENT,BOTH PMN AND MONONUCLEAR NO ORGANISMS SEEN Performed at [facility], [place] 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 She presents for paracentesis. EXAM: ULTRASOUND GUIDED  PARACENTESIS MEDICATIONS: None. COMPLICATIONS: None immediate. PROCEDURE: Informed written consent was obtained from the patient after a discussion of the risks, benefits and alternatives to treatment. A timeout was performed prior to the initiation of the procedure. Initial ultrasound scanning demonstrates a large amount of ascites within the right lower abdominal quadrant. The right lower abdomen was prepped and draped in the usual sterile fashion. 1% lidocaine with epinephrine was used for local anesthesia. Following this, a 6 Fr Safe-T-Centesis catheter was introduced. An ultrasound image was saved for documentation purposes. The paracentesis was performed.  The catheter was removed and a dressing was applied. The patient tolerated the procedure well without immediate post procedural complication. FINDINGS: A total of approximately 6700 mL of clear yellow fluid was removed. Samples were sent to the laboratory if requested by the clinical team. IMPRESSION: Successful ultrasound-guided paracentesis yielding 6.7 liters of peritoneal fluid. Electronically Signed   By: Jacqulynn Cadet M.D.   On: 12/07/2018 13:40   Medications:  Scheduled Meds: . [MAR Hold] feeding supplement (ENSURE ENLIVE)  237 mL Oral  BID BM  . [MAR Hold] lactulose  30 g Oral BID  . [MAR Hold] metoprolol tartrate  25 mg Oral BID  . [MAR Hold] midodrine  5 mg Oral TID WC  . [MAR Hold] multivitamin-lutein  1 capsule Oral Daily  . [MAR Hold] pantoprazole  40 mg Intravenous Q12H  . potassium chloride  20 mEq Oral BID  . [MAR Hold] rifaximin  550 mg Oral BID  . [MAR Hold] thiamine  100 mg Oral Daily   Continuous Infusions: . sodium chloride 20 mL/hr at 12/08/18 0935  . [MAR Hold] cefTRIAXone (ROCEPHIN)  IV 1 g (12/07/18 2216)  . pantoprozole (PROTONIX) infusion 8 mg/hr (12/07/18 1809)   PRN Meds:.[MAR Hold] acetaminophen **OR** [MAR Hold] acetaminophen, [MAR Hold] ondansetron **OR** [MAR Hold] ondansetron (ZOFRAN) IV   Assessment: Coffee-ground emesis   Plan: Due to drop in hemoglobin to 8.6 compared to 10.2 yesterday morning, we will proceed with EGD to rule out any sources of upper GI bleeding.  Again, coffee-ground emesis is not specific for GI bleed and can be coffee-ground in appearance due to stomach contents itself  Clinical symptoms not consistent with variceal bleed given hemodynamic stability, no further vomiting after initial episode etc.  EGD would also allow [place] to rule out any varices given her chronic alcoholic cirrhosis  Continue PPI twice daily Continue n.p.o. Continue serial CBC and transfuse PRN  I have discussed alternative options, risks & benefits,  which include, but are not limited to, bleeding, infection, perforation,respiratory complication & drug reaction.  The patient agrees with this plan & written consent will be obtained.      LOS: 3 days   Dana Antigua, MD 12/08/2018, 9:41 AM

## 2018-12-08 NOTE — Op Note (Addendum)
[facility] Gastroenterology Patient Name: [name] Procedure Date: 12/08/2018 9:34 AM MRN: [id number] Account #: [id number] Date of Birth: 06/04/1956 Admit Type: Inpatient Age: 62 Room: [facility] ENDO ROOM 4 Gender: Female Note Status: Finalized Procedure:            Upper GI endoscopy Indications:          Coffee-ground emesis Providers:            Vaishnav Demartin B. Bonna Gains MD, MD Medicines:            Monitored Anesthesia Care Complications:        No immediate complications. Procedure:            Pre-Anesthesia Assessment:                       - Prior to the procedure, a History and Physical was                        performed, and patient medications, allergies and                        sensitivities were reviewed. The patient's tolerance of                        previous anesthesia was reviewed.                       - The risks and benefits of the procedure and the                        sedation options and risks were discussed with the                        patient. All questions were answered and informed                        consent was obtained.                       - Patient identification and proposed procedure were                        verified prior to the procedure by the physician, the                        nurse, the anesthesiologist, the anesthetist and the                        technician. The procedure was verified in the procedure                        room.                       - ASA Grade Assessment: III - A patient with severe                        systemic disease.                       After obtaining informed consent, the endoscope was  passed under direct vision. Throughout the procedure,                        the patient's blood pressure, pulse, and oxygen                        saturations were monitored continuously. The Endoscope                        was introduced through the mouth, and  advanced to the                        second part of duodenum. The upper GI endoscopy was                        accomplished with ease. The patient tolerated the                        procedure well. Findings:      LA Grade D (one or more mucosal breaks involving at least 75% of       esophageal circumference) esophagitis with no bleeding was found in the       distal esophagus.      There is no endoscopic evidence of varices in the entire esophagus.      Localized mild mucosal changes characterized by thickened folds were       found in the gastric fundus. Biopsies were taken with a cold forceps for       histology.      Patchy mildly erythematous mucosa without bleeding was found in the       gastric antrum. Biopsies were taken with a cold forceps for histology.       Biopsies were obtained in the gastric body, at the incisura and in the       gastric antrum with cold forceps for histology.      One non-bleeding superficial duodenal ulcer with no stigmata of bleeding       was found in the duodenal bulb. The lesion was 4 mm in largest dimension.      The second portion of the duodenum was normal. Impression:           - LA Grade D reflux esophagitis. This is the source of                        coffee ground emesis                       - Thickened folds mucosa in the gastric fundus.                        Biopsied.                       - Erythematous mucosa in the antrum. Biopsied.                       - One non-bleeding duodenal ulcer with no stigmata of                        bleeding.                       -  Normal second portion of the duodenum.                       - Biopsies were obtained in the gastric body, at the                        incisura and in the gastric antrum.                       - No evidence of active or recent bleeding seen                        throughout the exam. Recommendation:       - Use Protonix (pantoprazole) 40 mg PO BID.                        - Follow an antireflux regimen.                       - Await pathology results.                       - Perform an H. pylori serology today.                       - Advance diet as tolerated.                       - Continue present medications.                       - Patient has a contact number available for                        emergencies. The signs and symptoms of potential                        delayed complications were discussed with the patient.                        Return to normal activities tomorrow. Written discharge                        instructions were provided to the patient.                       - The findings and recommendations were discussed with                        the patient.                       - Return to GI clinic in 2 weeks.                       - Return to primary care physician in 2 weeks. Procedure Code(s):    --- Professional ---                       [phone], Esophagogastroduodenoscopy, flexible, transoral;                        with biopsy, single  or multiple Diagnosis Code(s):    --- Professional ---                       K21.0, Gastro-esophageal reflux disease with esophagitis                       K31.89, Other diseases of stomach and duodenum                       K26.9, Duodenal ulcer, unspecified as acute or chronic,                        without hemorrhage or perforation                       K92.0, Hematemesis CPT copyright 2018 American Medical Association. All rights reserved. The codes documented in this report are preliminary and upon coder review may  be revised to meet current compliance requirements.  Vonda Antigua, MD Margretta Sidle B. Bonna Gains MD, MD 12/08/2018 10:20:20 AM This report has been signed electronically. Number of Addenda: 0 Note Initiated On: 12/08/2018 9:34 AM Estimated Blood Loss: Estimated blood loss: none.      [facility]

## 2018-12-08 NOTE — Progress Notes (Signed)
Chart reviewed.    Patient remembered my name when I entered the room, but then told me that whenever she comes to this restaurant she tries to get the fish because it is so good - I really should try it.  Patient eating lunch.  Appears stable overall.  Endoscopy revealed non-bleeding ulcer.  Her creatinine has remained at approximately 3.    She will be ready for DC to SNF soon.    DNR.  Recommend Palliative to follow.  PMT will sign off.  Please call us back if she declines and we can be of assistance.  Florentina Jenny, PA-C Palliative Medicine Pager: [phone]

## 2018-12-08 NOTE — Progress Notes (Signed)
[place] [place] NAME: [name]    MR#:  [id number]  DATE OF BIRTH:  23-Jun-1956  SUBJECTIVE:   Overall feels a lot better.  Status post ultrasound-guided paracentesis yesterday with 7 L of fluid removed.  Underwent upper GI endoscopy today which showed esophagitis and nonbleeding duodenal ulcer.  Hemoglobin stable.  No further coffee-ground emesis overnight.  REVIEW OF SYSTEMS:    Review of Systems  Unable to perform ROS: Mental acuity   Nutrition: Heart healthy Tolerating Diet: Yes Tolerating PT: Eval noted.   DRUG ALLERGIES:  No Known Allergies  VITALS:  Blood pressure (!) 85/65, pulse 89, temperature (!) 97 F (36.1 C), temperature source Tympanic, resp. rate 18, height 5\' 9"  (1.753 m), weight 63.5 kg, SpO2 99 %.  PHYSICAL EXAMINATION:   Physical Exam  GENERAL:  62 y.o.-year-old patient lying in bed in no acute distress.  EYES: Pupils equal, round, reactive to light and accommodation. + scleral icterus. Extraocular muscles intact. Pale Conjunctiva.  HEENT: Head atraumatic, normocephalic. Oropharynx and nasopharynx clear.  NECK:  Supple, no jugular venous distention. No thyroid enlargement, no tenderness.  LUNGS: Normal breath sounds bilaterally, no wheezing, rales, rhonchi. No use of accessory muscles of respiration.  CARDIOVASCULAR: S1, S2 normal. No murmurs, rubs, or gallops.  ABDOMEN: Soft, nontender, slightly distended, + fluid wave consistent with ascites. Bowel sounds present. No organomegaly or mass.  EXTREMITIES: No cyanosis, clubbing, +1-2 edema b/l.   NEUROLOGIC: Cranial nerves II through XII are intact. No focal Motor or sensory deficits b/l.  Globally weak.     PSYCHIATRIC: The patient is alert and oriented x 2.  SKIN: No obvious rash, lesion, or ulcer.    LABORATORY PANEL:   CBC Recent Labs  Lab 12/08/18 0449  WBC 9.9  HGB 8.6*  HCT 26.3*  PLT 228    ------------------------------------------------------------------------------------------------------------------  Chemistries  Recent Labs  Lab 12/07/18 0441 12/08/18 0449  NA 134* 135  K 4.0 3.7  CL 107 108  CO2 17* 18*  GLUCOSE 90 93  BUN 32* 35*  CREATININE 3.04* 2.99*  CALCIUM 8.1* 7.9*  MG 1.3*  --   AST 76* 50*  ALT 32 23  ALKPHOS 91 67  BILITOT 1.0 0.9   ------------------------------------------------------------------------------------------------------------------  Cardiac Enzymes No results for input(s): TROPONINI in the last 168 hours. 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 She presents for paracentesis. EXAM: ULTRASOUND GUIDED  PARACENTESIS MEDICATIONS: None. COMPLICATIONS: None immediate. PROCEDURE: Informed written consent was obtained from the patient after a discussion of the risks, benefits and alternatives to treatment. A timeout was performed prior to the initiation of the procedure. Initial ultrasound scanning demonstrates a large amount of ascites within the right lower abdominal quadrant. The right lower abdomen was prepped and draped in the usual sterile fashion. 1% lidocaine with epinephrine was used for local anesthesia. Following this, a 6 Fr Safe-T-Centesis catheter was introduced. An ultrasound image was saved for documentation purposes. The paracentesis was performed. The catheter was removed and a dressing was applied. The patient tolerated the procedure well without immediate post procedural complication. FINDINGS: A total of approximately 6700 mL of clear yellow fluid was removed. Samples were sent to the laboratory if requested by the clinical team. IMPRESSION: Successful ultrasound-guided paracentesis yielding 6.7  liters of peritoneal fluid. Electronically Signed   By: Jacqulynn Cadet M.D.   On:  12/07/2018 13:40     ASSESSMENT AND PLAN:   62 year old female with past medical history of chronic liver disease secondary to alcohol abuse, paroxysmal atrial fibrillation, anxiety/depression, recurrent hospitalizations who presented to the hospital due to weakness, altered mental status.  1.  Altered mental status- etiology unclear.  No evidence of hepatic encephalopathy as Ammonia level is normal.  -Possibly due to underlying acute on chronic kidney injury and also mild dehydration and suspected UTI. Renal function slightly improved since yesterday.  Continue IV ceftriaxone for the UTI and urine cultures are still pending.  Mental status improving.  2.  Urinary tract infection-based off a urinalysis on admission.  Her previous admission patient urinalysis was positive but her urine cultures grew out yeast which was a contaminant. -Continue IV ceftriaxone for now, await urine culture results.   3.  GI bleed-patient developed some coffee-ground emesis yesterday after coming back from paracentesis. -Seen by gastroenterology and status post upper GI endoscopy this morning showing esophagitis which is a source of patient's coffee-ground emesis along with a nonbleeding duodenal ulcer.  Continue IV Protonix, diet has been advanced.  Hemoglobin stable we will continue to monitor.  4.  Acute on chronic renal failure- secondary to dehydration/relative hypotension. -Baseline creatinine is around 1.5 and today is 2.9.   - Await further nephrology input.  Given some albumin yesterday and renal function slightly improved. - ?? Related to GI bleed/hepatorenal.   -  Renal dose meds, avoid nephrotoxins.  5.  Alcoholic liver cirrhosis-clinically No evidence of hepatic encephalopathy.  Continue lactulose, Xifaxan. -Status post ultrasound-guided paracentesis on 12/19 with 6 L of fluid removed.   -Prognosis is quite poor and  appreciate palliative care input and patient is a DNR now.  6.  Chronic hypotension-secondary to chronic liver disease.  Continue midodrine.  7.  Hypokalemia- improved with supplementation and will cont. To monitor.   8.  History of paroxysmal atrial fibrillation- rate controlled.  Continue metoprolol. -Not on long-term anticoagulation given thrombocytopenia and high risk of bleeding.  Await PT eval.    All the records are reviewed and case discussed with Care Management/Social Worker. Management plans discussed with the patient, family and they are in agreement.  CODE STATUS: DNR  DVT Prophylaxis: Ted's & SCD's.   TOTAL TIME TAKING CARE OF THIS PATIENT: 30 minutes.   POSSIBLE D/C IN 2-3 DAYS, DEPENDING ON CLINICAL CONDITION.   Henreitta Leber M.D on 12/08/2018 at 3:40 PM  Between 7am to 6pm - Pager - [phone]  After 6pm go to [email or web address] - Proofreader  [facility]  Office  [phone]  CC: Primary care physician; System, Pcp Not In

## 2018-12-08 NOTE — Anesthesia Preprocedure Evaluation (Signed)
Anesthesia Evaluation  Patient identified by MRN, date of birth, ID band Patient awake    Reviewed: Allergy & Precautions, NPO status , Patient's Chart, lab work & pertinent test results  Airway Mallampati: III       Dental   Pulmonary Current Smoker,    Pulmonary exam normal        Cardiovascular Normal cardiovascular exam+ dysrhythmias Atrial Fibrillation      Neuro/Psych PSYCHIATRIC DISORDERS Depression    GI/Hepatic GERD  Medicated,(+) Cirrhosis       ,   Endo/Other    Renal/GU ARF and CRFRenal disease  negative genitourinary   Musculoskeletal negative musculoskeletal ROS (+)   Abdominal Normal abdominal exam  (+)   Peds negative pediatric ROS (+)  Hematology  (+) anemia ,   Anesthesia Other Findings   Reproductive/Obstetrics                             Anesthesia Physical Anesthesia Plan  ASA: III  Anesthesia Plan: General   Post-op Pain Management:    Induction: Intravenous  PONV Risk Score and Plan:   Airway Management Planned: Nasal Cannula  Additional Equipment:   Intra-op Plan:   Post-operative Plan:   Informed Consent: I have reviewed the patients History and Physical, chart, labs and discussed the procedure including the risks, benefits and alternatives for the proposed anesthesia with the patient or authorized representative who has indicated his/her understanding and acceptance.   Dental advisory given  Plan Discussed with: CRNA and Surgeon  Anesthesia Plan Comments:         Anesthesia Quick Evaluation

## 2018-12-08 NOTE — Anesthesia Post-op Follow-up Note (Signed)
Anesthesia QCDR form completed.        

## 2018-12-08 NOTE — Progress Notes (Signed)
Per MD patient may be stable for D/C over the weekend. Per [name] at [facility] patient can come to the facility over the weekend. Patient's husband Joaquim Lai completed admissions paper work for [name] and Holiday representative ([place]) emailed completed paper work to Development worker, international aid at [name]. Patient's husband is aware of possible D/C over the weekend. APS worker Jeanice Lim is aware of above.   [employer], LCSW [phone]

## 2018-12-08 NOTE — Anesthesia Postprocedure Evaluation (Signed)
Anesthesia Post Note  Patient: [name]  Procedure(s) Performed: ESOPHAGOGASTRODUODENOSCOPY (EGD) (N/A )  Patient location during evaluation: Endoscopy Anesthesia Type: General Level of consciousness: awake Pain management: pain level controlled Vital Signs Assessment: post-procedure vital signs reviewed and stable Respiratory status: spontaneous breathing Cardiovascular status: blood pressure returned to baseline Anesthetic complications: no     Last Vitals:  Vitals:   12/08/18 0923 12/08/18 1025  BP: (!) 86/57 (!) 86/59  Pulse: 90 90  Resp: 18 20  Temp: 36.5 C (!) 36.1 C  SpO2: 100% 100%    Last Pain:  Vitals:   12/08/18 1025  TempSrc: Tympanic  PainSc: 0-No pain                 Kimberly Coye

## 2018-12-08 NOTE — Progress Notes (Signed)
PT Cancellation Note  Patient Details Name: [name] MRN: [id number] DOB: [birth year]   Cancelled Treatment:    Reason Eval/Treat Not Completed: Fatigue/lethargy limiting ability to participate.  Had paracentesis and declined even bed ex.  Try again at another time.   Ramond Dial 12/08/2018, 2:22 PM   Mee Hives, PT MS Acute Rehab Dept. Number: [place]

## 2018-12-08 NOTE — Transfer of Care (Signed)
Immediate Anesthesia Transfer of Care Note  Patient: [name]  Procedure(s) Performed: ESOPHAGOGASTRODUODENOSCOPY (EGD) (N/A )  Patient Location: PACU and Endoscopy Unit  Anesthesia Type:General  Level of Consciousness: awake and alert   Airway & Oxygen Therapy: Patient Spontanous Breathing  Post-op Assessment: Report given to RN and Post -op Vital signs reviewed and stable  Post vital signs: Reviewed and stable  Last Vitals:  Vitals Value Taken Time  BP 86/59 12/08/2018 10:25 AM  Temp 36.1 C 12/08/2018 10:25 AM  Pulse 92 12/08/2018 10:27 AM  Resp 14 12/08/2018 10:27 AM  SpO2 99 % 12/08/2018 10:27 AM  Vitals shown include unvalidated device data.  Last Pain:  Vitals:   12/08/18 1025  TempSrc: Tympanic  PainSc: 0-No pain         Complications: No apparent anesthesia complications

## 2018-12-09 LAB — CBC
HCT: 26.3 % — ABNORMAL LOW (ref 36.0–46.0)
HEMOGLOBIN: 8.7 g/dL — AB (ref 12.0–15.0)
MCH: 31.9 pg (ref 26.0–34.0)
MCHC: 33.1 g/dL (ref 30.0–36.0)
MCV: 96.3 fL (ref 80.0–100.0)
Platelets: 211 10*3/uL (ref 150–400)
RBC: 2.73 MIL/uL — ABNORMAL LOW (ref 3.87–5.11)
RDW: 15.2 % (ref 11.5–15.5)
WBC: 8.5 10*3/uL (ref 4.0–10.5)
nRBC: 0 % (ref 0.0–0.2)

## 2018-12-09 LAB — COMPREHENSIVE METABOLIC PANEL
ALT: 25 U/L (ref 0–44)
AST: 54 U/L — ABNORMAL HIGH (ref 15–41)
Albumin: 2.3 g/dL — ABNORMAL LOW (ref 3.5–5.0)
Alkaline Phosphatase: 74 U/L (ref 38–126)
Anion gap: 8 (ref 5–15)
BUN: 35 mg/dL — ABNORMAL HIGH (ref 8–23)
CO2: 17 mmol/L — ABNORMAL LOW (ref 22–32)
Calcium: 8 mg/dL — ABNORMAL LOW (ref 8.9–10.3)
Chloride: 110 mmol/L (ref 98–111)
Creatinine, Ser: 3.08 mg/dL — ABNORMAL HIGH (ref 0.44–1.00)
GFR calc Af Amer: 18 mL/min — ABNORMAL LOW (ref >60)
GFR calc non Af Amer: 15 mL/min — ABNORMAL LOW (ref >60)
GLUCOSE: 85 mg/dL (ref 70–99)
Potassium: 3.7 mmol/L (ref 3.5–5.1)
Sodium: 135 mmol/L (ref 135–145)
Total Bilirubin: 0.9 mg/dL (ref 0.3–1.2)
Total Protein: 4.8 g/dL — ABNORMAL LOW (ref 6.5–8.1)

## 2018-12-09 LAB — URINE CULTURE: Culture: >=100000 — AB

## 2018-12-09 MED ORDER — PANTOPRAZOLE SODIUM 40 MG PO TBEC: 40.0000 mg | DELAYED_RELEASE_TABLET | Freq: Two times a day (BID) | ORAL | 1 refills | Status: DC

## 2018-12-09 MED ORDER — SPIRONOLACTONE 25 MG PO TABS: 12.5000 mg | ORAL_TABLET | Freq: Every day | ORAL | 0 refills | Status: DC

## 2018-12-09 MED ORDER — CEPHALEXIN 250 MG PO CAPS: 250.0000 mg | ORAL_CAPSULE | Freq: Two times a day (BID) | ORAL | 0 refills | Status: DC

## 2018-12-09 MED ORDER — PANTOPRAZOLE SODIUM 40 MG PO TBEC
40.0000 mg | DELAYED_RELEASE_TABLET | Freq: Every day | ORAL | Status: DC
  Administered 2018-12-09: 40 mg via ORAL
  Filled 2018-12-09: qty 1

## 2018-12-09 MED ORDER — OCUVITE-LUTEIN PO CAPS: 1.0000 | ORAL_CAPSULE | Freq: Every day | ORAL | 0 refills | Status: DC

## 2018-12-09 MED ORDER — CEPHALEXIN 250 MG PO CAPS
250.0000 mg | ORAL_CAPSULE | Freq: Two times a day (BID) | ORAL | Status: DC
  Administered 2018-12-09: 250 mg via ORAL
  Filled 2018-12-09 (×2): qty 1

## 2018-12-09 MED ORDER — ENSURE ENLIVE PO LIQD: 237.0000 mL | Freq: Two times a day (BID) | ORAL | 12 refills | Status: DC

## 2018-12-09 MED ORDER — METOPROLOL TARTRATE 25 MG PO TABS: 12.5000 mg | ORAL_TABLET | Freq: Every day | ORAL | 1 refills | Status: DC

## 2018-12-09 NOTE — Progress Notes (Signed)
Pharmacist - Prescriber Communication  Cephalexin dose modified from 500 mg po Q12H to 250 mg po Q12H due to CrCl 15 to 29 mL/min.   Harnoor Kohles A. Jordan Hawks, PharmD, BCPS Clinical Pharmacist 12/09/2018 09:31

## 2018-12-09 NOTE — Progress Notes (Signed)
Report called to [facility] at [facility]. AVS printed. IV removed. NT to prepare patient for transport via EMS. EMS called.

## 2018-12-09 NOTE — Progress Notes (Signed)
BP 85/63. Dr. Posey Pronto made aware. No new orders received at this time.

## 2018-12-09 NOTE — Clinical Social Work Note (Signed)
The patient will discharge today to [name] [facility] under an [place]. The patient, the facility, and the patient's husband are aware and in agreement. The CSW has sent all documentation to the facility and has delivered the discharge packet. The CSW has also updated [facility] DSS/APS of the imminent discharge. CSW is signing off. Please consult should needs arise.  Santiago Bumpers, MSW, Latanya Presser [phone]

## 2018-12-09 NOTE — Discharge Summary (Signed)
[facility] NAME: [name]    MR#:  [id number]  DATE OF BIRTH:  19-Feb-1956  DATE OF ADMISSION:  12/05/2018 ADMITTING PHYSICIAN: Lance Coon, MD  DATE OF DISCHARGE: 12/09/2018  PRIMARY CARE PHYSICIAN: System, Pcp Not In    ADMISSION DIAGNOSIS:  Weakness [R53.1] Hypotension, unspecified hypotension type [I95.9] Urinary tract infection without hematuria, site unspecified [N39.0]  DISCHARGE DIAGNOSIS:  *altered mental status etiology unclear improving, possibly acute on chronic kidney injury and suspected mild UTI *UTI *G.I. bleed secondary to esophagitis *chronic cirrhosis alcohol-related with ascites status post paracentesis  SECONDARY DIAGNOSIS:   Past Medical History:  Diagnosis Date  . Alcoholic liver disease ([facility])   . Cancer ([facility])    skin  . Depression   . ETOH abuse   . GERD (gastroesophageal reflux disease)   . PAF (paroxysmal atrial fibrillation) [facility])     HOSPITAL COURSE:   62 year old female with past medical history of chronic liver disease secondary to alcohol abuse, paroxysmal atrial fibrillation, anxiety/depression, recurrent hospitalizations who presented to the hospital due to weakness, altered mental status.  1. Altered mental status- etiology unclear.  No evidence of hepatic encephalopathy as Ammonia level is normal.  -Possibly due to underlying acute on chronic kidney injury and also mild dehydration and suspected UTI. Renal function slightly improved since yesterday.   received IV ceftriaxone for the UTI and urine cultures thousand unidentified organism. Will change to oral Keflex.  Mental status improving.  2.  Urinary tract infection-based off a urinalysis on admission.  Her previous admission patient urinalysis was positive but her urine cultures grew out yeast which was a contaminant. -Continue IV ceftriaxone for now, await urine culture results.   3.  GI bleed-patient developed  some coffee-ground emesis yesterday after coming back from paracentesis. -Seen by gastroenterology and status post upper GI endoscopy  showing esophagitis which is a source of patient's coffee-ground emesis along with a nonbleeding duodenal ulcer. -Received IV Protonix--- change to PO Protonix - diet has been advanced.  Hemoglobin stable we will continue to monitor.  4.  Acute on chronic renal failure- secondary to dehydration/relative hypotension. -Baseline creatinine is around 1.5 and today is 3.0 (likely new baseline)   - Await further nephrology input--no further workup with Dr. Candiss Norse -deceived some albumin yesterday and renal function slightly improved. - ?? Related to GI bleed/hepatorenal.   -  Renal dose meds, avoid nephrotoxins.  5.  Alcoholic liver cirrhosis-clinically No evidence of hepatic encephalopathy.   -Continue lactulose, Xifaxan. -Status post ultrasound-guided paracentesis on 12/19 with 6 L of fluid removed.   -Prognosis is quite poor and appreciate palliative care input and patient is a DNR now.  6.  Chronic hypotension-secondary to chronic liver disease.  Continue midodrine. -I decrease the dose of beta blockers to 12.5 PO daily  7.  Hypokalemia- improved with supplementation and will cont. To monitor.   8.  History of paroxysmal atrial fibrillation- rate controlled.  Continue metoprolol. -Not on long-term anticoagulation given thrombocytopenia and high risk of bleeding.  Overall appears best at baseline. Patient more awake alert and oriented. Will discharge to [facility] rehab today. It is agreeable with the plan.  CONSULTS OBTAINED:  Treatment Team:  Murlean Iba, MD  DRUG ALLERGIES:  No Known Allergies  DISCHARGE MEDICATIONS:   Allergies as of 12/09/2018   No Known Allergies     Medication List    TAKE these medications   cephALEXin 250 MG capsule Commonly  known as:  KEFLEX Take 1 capsule (250 mg total) by mouth every 12 (twelve)  hours.   feeding supplement (ENSURE ENLIVE) Liqd Take 237 mLs by mouth 2 (two) times daily between meals.   lactulose 10 GM/15ML solution Commonly known as:  CHRONULAC Take 30 g by mouth 3 (three) times daily.   metoprolol tartrate 25 MG tablet Commonly known as:  LOPRESSOR Take 0.5 tablets (12.5 mg total) by mouth daily. Hold if sbp <110 What changed:    how much to take  when to take this  additional instructions   midodrine 5 MG tablet Commonly known as:  PROAMATINE Take 5 mg by mouth 3 (three) times daily with meals.   multivitamin-lutein Caps capsule Take 1 capsule by mouth daily. Start taking on:  December 10, 2018   pantoprazole 40 MG tablet Commonly known as:  PROTONIX Take 1 tablet (40 mg total) by mouth 2 (two) times daily.   rifaximin 550 MG Tabs tablet Commonly known as:  XIFAXAN Take 550 mg by mouth 2 (two) times daily.   spironolactone 25 MG tablet Commonly known as:  ALDACTONE Take 0.5 tablets (12.5 mg total) by mouth daily. What changed:  how much to take   thiamine 100 MG tablet Take 100 mg by mouth daily.       If you experience worsening of your admission symptoms, develop shortness of breath, life threatening emergency, suicidal or homicidal thoughts you must seek medical attention immediately by calling 911 or calling your MD immediately  if symptoms less severe.  You Must read complete instructions/literature along with all the possible adverse reactions/side effects for all the Medicines you take and that have been prescribed to you. Take any new Medicines after you have completely understood and accept all the possible adverse reactions/side effects.   Please note  You were cared for by a hospitalist during your hospital stay. If you have any questions about your discharge medications or the care you received while you were in the hospital after you are discharged, you can call the unit and asked to speak with the hospitalist on call if the  hospitalist that took care of you is not available. Once you are discharged, your primary care physician will handle any further medical issues. Please note that NO REFILLS for any discharge medications will be authorized once you are discharged, as it is imperative that you return to your primary care physician (or establish a relationship with a primary care physician if you do not have one) for your aftercare needs so that they can reassess your need for medications and monitor your lab values. Today   SUBJECTIVE   No new complaints. More awake and alert. Wants to eat soft diet.  VITAL SIGNS:  Blood pressure 95/68, pulse (!) 117, temperature 97.7 F (36.5 C), temperature source Oral, resp. rate 13, height 5\' 9"  (1.753 m), weight 63.5 kg, SpO2 100 %.  I/O:    Intake/Output Summary (Last 24 hours) at 12/09/2018 1004 Last data filed at 12/08/2018 1025 Gross per 24 hour  Intake 250 ml  Output -  Net 250 ml    PHYSICAL EXAMINATION:   GENERAL:  61 y.o.-year-old patient lying in bed in no acute distress.  EYES: Pupils equal, round, reactive to light and accommodation. + scleral icterus. Extraocular muscles intact. Pale Conjunctiva.  HEENT: Head atraumatic, normocephalic. Oropharynx and nasopharynx clear.  NECK:  Supple, no jugular venous distention. No thyroid enlargement, no tenderness.  LUNGS: Normal breath sounds bilaterally, no wheezing,  rales, rhonchi. No use of accessory muscles of respiration.  CARDIOVASCULAR: S1, S2 normal. No murmurs, rubs, or gallops.  ABDOMEN: Soft, nontender, slightly distended,  Bowel sounds present. No organomegaly or mass.  EXTREMITIES: No cyanosis, clubbing, +1-2 edema b/l.   NEUROLOGIC: Cranial nerves II through XII are intact. No focal Motor or sensory deficits b/l.  Globally weak.     PSYCHIATRIC: The patient is alert and oriented x 2.  SKIN: No obvious rash, lesion, or ulcer.  DATA REVIEW:   CBC  Recent Labs  Lab 12/09/18 0440  WBC 8.5  HGB  8.7*  HCT 26.3*  PLT 211    Chemistries  Recent Labs  Lab 12/07/18 0441  12/09/18 0440  NA 134*   < > 135  K 4.0   < > 3.7  CL 107   < > 110  CO2 17*   < > 17*  GLUCOSE 90   < > 85  BUN 32*   < > 35*  CREATININE 3.04*   < > 3.08*  CALCIUM 8.1*   < > 8.0*  MG 1.3*  --   --   AST 76*   < > 54*  ALT 32   < > 25  ALKPHOS 91   < > 74  BILITOT 1.0   < > 0.9   < > = values in this interval not displayed.    Microbiology Results   Recent Results (from the past 240 hour(s))  Urine Culture     Status: Abnormal (Preliminary result)   Collection Time: 12/05/18  7:20 PM  Result Value Ref Range Status   Specimen Description   Final    URINE, RANDOM Performed at [facility], [address], [place], [place] [address]    Special Requests   Final    NONE Performed at [facility], [address], [place], [place] [address]    Culture (A)  Final    >=100,000 COLONIES/mL UNIDENTIFIED ORGANISM Performed at [facility], [place] [address] [place], [place] [address]    Report Status PENDING  Incomplete  Body fluid culture     Status: None (Preliminary result)   Collection Time: 12/07/18 12:05 PM  Result Value Ref Range Status   Specimen Description   Final    PERITONEAL Performed at [facility], [address], [place], [place] [address]    Special Requests   Final    NONE Performed at [facility], [place] [place], [place] [address]    Gram Stain   Final    RARE WBC PRESENT,BOTH PMN AND MONONUCLEAR NO ORGANISMS SEEN    Culture   Final    NO GROWTH 2 DAYS Performed at [facility], [place] [address] [place], [place] [address]    Report Status PENDING  Incomplete    RADIOLOGY:  US Paracentesis  Result Date: 12/07/2018 INDICATION: 62 year old female with recurrent large volume ascites secondary to alcoholic cirrhosis. She presents for paracentesis. EXAM: ULTRASOUND GUIDED  PARACENTESIS MEDICATIONS: None.  COMPLICATIONS: None immediate. PROCEDURE: Informed written consent was obtained from the patient after a discussion of the risks, benefits and alternatives to treatment. A timeout was performed prior to the initiation of the procedure. Initial ultrasound scanning demonstrates a large amount of ascites within the right lower abdominal quadrant. The right lower abdomen was prepped and draped in the usual sterile fashion. 1% lidocaine with epinephrine was used for local anesthesia. Following this, a 6 Fr Safe-T-Centesis catheter  was introduced. An ultrasound image was saved for documentation purposes. The paracentesis was performed. The catheter was removed and a dressing was applied. The patient tolerated the procedure well without immediate post procedural complication. FINDINGS: A total of approximately 6700 mL of clear yellow fluid was removed. Samples were sent to the laboratory if requested by the clinical team. IMPRESSION: Successful ultrasound-guided paracentesis yielding 6.7 liters of peritoneal fluid. Electronically Signed   By: Jacqulynn Cadet M.D.   On: 12/07/2018 13:40     Management plans discussed with the patient, family and they are in agreement.  CODE STATUS:     Code Status Orders  (From admission, onward)         Start     Ordered   12/07/18 1224  Do not attempt resuscitation (DNR)  Continuous    Question Answer Comment  In the event of cardiac or respiratory ARREST Do not call a "code blue"   In the event of cardiac or respiratory ARREST Do not perform Intubation, CPR, defibrillation or ACLS   In the event of cardiac or respiratory ARREST Use medication by any route, position, wound care, and other measures to relive pain and suffering. May use oxygen, suction and manual treatment of airway obstruction as needed for comfort.      12/07/18 1223        Code Status History    Date Active Date Inactive Code Status Order ID Comments User Context   12/06/2018 0001 12/07/2018  1223 Full Code [id number]  Lance Coon, MD Inpatient   11/26/2018 2121 11/29/2018 1944 Full Code [id number]  Gorden Harms, MD ED   11/09/2018 1951 11/20/2018 1804 Full Code [id number]  [name], MD Inpatient   10/25/2018 0251 10/29/2018 2045 Full Code [id number]  Lance Coon, MD Inpatient   05/19/2018 1958 05/21/2018 1712 Full Code [id number]  Fritzi Mandes, MD Inpatient   11/27/2017 0109 11/29/2017 1844 Full Code [id number]  Gorden Harms, MD Inpatient   09/26/2016 1313 09/27/2016 1802 Full Code [id number]  Dustin Flock, MD ED      TOTAL TIME TAKING CARE OF THIS PATIENT: *40* minutes.    Fritzi Mandes M.D on 12/09/2018 at 10:04 AM  Between 7am to 6pm - Pager - [phone] After 6pm go to [email or web address] - password [email or web address] [facility]  Office  [phone]  CC: Primary care physician; System, Pcp Not In

## 2018-12-10 LAB — BODY FLUID CULTURE: Culture: NO GROWTH

## 2018-12-11 LAB — H PYLORI, IGM, IGG, IGA AB
H Pylori IgG: 0.24 Index Value (ref 0.00–0.79)
H. Pylogi, Iga Abs: <9 units (ref 0.0–8.9)
H. Pylogi, Igm Abs: <9 units (ref 0.0–8.9)

## 2018-12-12 LAB — FUNGUS CULTURE RESULT

## 2018-12-12 LAB — FUNGAL ORGANISM REFLEX

## 2018-12-12 LAB — SURGICAL PATHOLOGY

## 2018-12-12 LAB — FUNGUS CULTURE WITH STAIN

## 2018-12-18 ENCOUNTER — Encounter: Payer: Self-pay | Admitting: Gastroenterology

## 2018-12-23 ENCOUNTER — Other Ambulatory Visit: Payer: Self-pay

## 2018-12-23 ENCOUNTER — Emergency Department (HOSPITAL_COMMUNITY): Payer: Self-pay

## 2018-12-23 ENCOUNTER — Inpatient Hospital Stay (HOSPITAL_COMMUNITY)
Admission: EM | Admit: 2018-12-23 | Discharge: 2018-12-28 | DRG: 871 | Disposition: A | Discharge status: Hospice | Payer: Self-pay | Attending: Internal Medicine | Admitting: Internal Medicine

## 2018-12-23 DIAGNOSIS — F329 Major depressive disorder, single episode, unspecified: Secondary | ICD-10-CM | POA: Diagnosis present

## 2018-12-23 DIAGNOSIS — Z515 Encounter for palliative care: Secondary | ICD-10-CM | POA: Diagnosis not present

## 2018-12-23 DIAGNOSIS — K7210 Chronic hepatic failure without coma: Secondary | ICD-10-CM | POA: Diagnosis present

## 2018-12-23 DIAGNOSIS — E871 Hypo-osmolality and hyponatremia: Secondary | ICD-10-CM | POA: Diagnosis present

## 2018-12-23 DIAGNOSIS — I509 Heart failure, unspecified: Secondary | ICD-10-CM | POA: Diagnosis present

## 2018-12-23 DIAGNOSIS — Z66 Do not resuscitate: Secondary | ICD-10-CM | POA: Diagnosis present

## 2018-12-23 DIAGNOSIS — R6521 Severe sepsis with septic shock: Secondary | ICD-10-CM | POA: Diagnosis present

## 2018-12-23 DIAGNOSIS — E861 Hypovolemia: Secondary | ICD-10-CM | POA: Diagnosis present

## 2018-12-23 DIAGNOSIS — K7290 Hepatic failure, unspecified without coma: Secondary | ICD-10-CM

## 2018-12-23 DIAGNOSIS — Z7189 Other specified counseling: Secondary | ICD-10-CM

## 2018-12-23 DIAGNOSIS — Z79899 Other long term (current) drug therapy: Secondary | ICD-10-CM

## 2018-12-23 DIAGNOSIS — Z7401 Bed confinement status: Secondary | ICD-10-CM

## 2018-12-23 DIAGNOSIS — K7200 Acute and subacute hepatic failure without coma: Secondary | ICD-10-CM | POA: Diagnosis present

## 2018-12-23 DIAGNOSIS — N189 Chronic kidney disease, unspecified: Secondary | ICD-10-CM | POA: Diagnosis present

## 2018-12-23 DIAGNOSIS — K767 Hepatorenal syndrome: Secondary | ICD-10-CM | POA: Diagnosis present

## 2018-12-23 DIAGNOSIS — F1010 Alcohol abuse, uncomplicated: Secondary | ICD-10-CM | POA: Diagnosis present

## 2018-12-23 DIAGNOSIS — N179 Acute kidney failure, unspecified: Secondary | ICD-10-CM | POA: Diagnosis present

## 2018-12-23 DIAGNOSIS — R52 Pain, unspecified: Secondary | ICD-10-CM

## 2018-12-23 DIAGNOSIS — K709 Alcoholic liver disease, unspecified: Secondary | ICD-10-CM | POA: Diagnosis present

## 2018-12-23 DIAGNOSIS — F17210 Nicotine dependence, cigarettes, uncomplicated: Secondary | ICD-10-CM | POA: Diagnosis present

## 2018-12-23 DIAGNOSIS — A419 Sepsis, unspecified organism: Principal | ICD-10-CM | POA: Diagnosis present

## 2018-12-23 DIAGNOSIS — G92 Toxic encephalopathy: Secondary | ICD-10-CM | POA: Diagnosis present

## 2018-12-23 DIAGNOSIS — K7682 Hepatic encephalopathy: Secondary | ICD-10-CM | POA: Diagnosis present

## 2018-12-23 DIAGNOSIS — E872 Acidosis: Secondary | ICD-10-CM | POA: Diagnosis present

## 2018-12-23 DIAGNOSIS — I9589 Other hypotension: Secondary | ICD-10-CM | POA: Diagnosis present

## 2018-12-23 DIAGNOSIS — Z85828 Personal history of other malignant neoplasm of skin: Secondary | ICD-10-CM

## 2018-12-23 DIAGNOSIS — I959 Hypotension, unspecified: Secondary | ICD-10-CM | POA: Diagnosis present

## 2018-12-23 DIAGNOSIS — K219 Gastro-esophageal reflux disease without esophagitis: Secondary | ICD-10-CM | POA: Diagnosis present

## 2018-12-23 DIAGNOSIS — I480 Paroxysmal atrial fibrillation: Secondary | ICD-10-CM | POA: Diagnosis present

## 2018-12-23 DIAGNOSIS — N19 Unspecified kidney failure: Secondary | ICD-10-CM | POA: Diagnosis present

## 2018-12-23 DIAGNOSIS — K7031 Alcoholic cirrhosis of liver with ascites: Secondary | ICD-10-CM | POA: Diagnosis present

## 2018-12-23 LAB — CBC WITH DIFFERENTIAL/PLATELET
Abs Immature Granulocytes: 0.09 10*3/uL — ABNORMAL HIGH (ref 0.00–0.07)
Basophils Absolute: 0.1 10*3/uL (ref 0.0–0.1)
Basophils Relative: 0 %
EOS ABS: 0 10*3/uL (ref 0.0–0.5)
EOS PCT: 0 %
HCT: 29.5 % — ABNORMAL LOW (ref 36.0–46.0)
Hemoglobin: 9.4 g/dL — ABNORMAL LOW (ref 12.0–15.0)
Immature Granulocytes: 1 %
LYMPHS ABS: 2.1 10*3/uL (ref 0.7–4.0)
Lymphocytes Relative: 14 %
MCH: 31.6 pg (ref 26.0–34.0)
MCHC: 31.9 g/dL (ref 30.0–36.0)
MCV: 99.3 fL (ref 80.0–100.0)
MONO ABS: 1.2 10*3/uL — AB (ref 0.1–1.0)
Monocytes Relative: 8 %
NEUTROS ABS: 11.6 10*3/uL — AB (ref 1.7–7.7)
Neutrophils Relative %: 77 %
Platelets: 285 10*3/uL (ref 150–400)
RBC: 2.97 MIL/uL — ABNORMAL LOW (ref 3.87–5.11)
RDW: 14.7 % (ref 11.5–15.5)
WBC: 15.1 10*3/uL — ABNORMAL HIGH (ref 4.0–10.5)
nRBC: 0 % (ref 0.0–0.2)

## 2018-12-23 LAB — COMPREHENSIVE METABOLIC PANEL
ALT: 25 U/L (ref 0–44)
AST: 48 U/L — ABNORMAL HIGH (ref 15–41)
Albumin: 2.4 g/dL — ABNORMAL LOW (ref 3.5–5.0)
Alkaline Phosphatase: 107 U/L (ref 38–126)
Anion gap: 12 (ref 5–15)
BUN: 54 mg/dL — ABNORMAL HIGH (ref 8–23)
CO2: 16 mmol/L — ABNORMAL LOW (ref 22–32)
Calcium: 8.6 mg/dL — ABNORMAL LOW (ref 8.9–10.3)
Chloride: 106 mmol/L (ref 98–111)
Creatinine, Ser: 4.91 mg/dL — ABNORMAL HIGH (ref 0.44–1.00)
GFR calc Af Amer: 10 mL/min — ABNORMAL LOW (ref >60)
GFR calc non Af Amer: 9 mL/min — ABNORMAL LOW (ref >60)
Glucose, Bld: 104 mg/dL — ABNORMAL HIGH (ref 70–99)
Potassium: 4.6 mmol/L (ref 3.5–5.1)
Sodium: 134 mmol/L — ABNORMAL LOW (ref 135–145)
Total Bilirubin: 1 mg/dL (ref 0.3–1.2)
Total Protein: 5.8 g/dL — ABNORMAL LOW (ref 6.5–8.1)

## 2018-12-23 LAB — I-STAT CG4 LACTIC ACID, ED
Lactic Acid, Venous: 1.42 mmol/L (ref 0.5–1.9)
Lactic Acid, Venous: 1.08 mmol/L (ref 0.5–1.9)

## 2018-12-23 LAB — PROTIME-INR
INR: 1
PROTHROMBIN TIME: 13.1 s (ref 11.4–15.2)

## 2018-12-23 LAB — AMMONIA: Ammonia: 18 umol/L (ref 9–35)

## 2018-12-23 LAB — LIPASE, BLOOD: Lipase: 36 U/L (ref 11–51)

## 2018-12-23 MED ORDER — MIDODRINE HCL 5 MG PO TABS: 5.0000 mg | ORAL_TABLET | Freq: Three times a day (TID) | ORAL | Status: DC

## 2018-12-23 MED ORDER — ONDANSETRON HCL 4 MG/2ML IJ SOLN
4.0000 mg | Freq: Once | INTRAMUSCULAR | Status: AC
  Administered 2018-12-23: 4 mg via INTRAVENOUS
  Filled 2018-12-23: qty 2

## 2018-12-23 MED ORDER — SODIUM CHLORIDE 0.9 % IV BOLUS (SEPSIS)
1000.0000 mL | Freq: Once | INTRAVENOUS | Status: AC
  Administered 2018-12-23: 1000 mL via INTRAVENOUS

## 2018-12-23 MED ORDER — SODIUM CHLORIDE 0.9 % IV SOLN
1.0000 g | INTRAVENOUS | Status: DC
  Administered 2018-12-24 – 2018-12-25 (×2): 1 g via INTRAVENOUS
  Filled 2018-12-23 (×3): qty 1

## 2018-12-23 MED ORDER — MIDODRINE HCL 5 MG PO TABS
10.0000 mg | ORAL_TABLET | Freq: Three times a day (TID) | ORAL | Status: DC
  Administered 2018-12-24 – 2018-12-27 (×10): 10 mg via ORAL
  Filled 2018-12-23 (×12): qty 2

## 2018-12-23 MED ORDER — MORPHINE SULFATE (PF) 4 MG/ML IV SOLN
4.0000 mg | Freq: Once | INTRAVENOUS | Status: AC
  Administered 2018-12-23: 4 mg via INTRAVENOUS
  Filled 2018-12-23: qty 1

## 2018-12-23 MED ORDER — RIFAXIMIN 550 MG PO TABS
550.0000 mg | ORAL_TABLET | Freq: Two times a day (BID) | ORAL | Status: DC
  Administered 2018-12-23 – 2018-12-26 (×6): 550 mg via ORAL
  Filled 2018-12-23 (×9): qty 1

## 2018-12-23 MED ORDER — METRONIDAZOLE IN NACL 5-0.79 MG/ML-% IV SOLN
500.0000 mg | Freq: Three times a day (TID) | INTRAVENOUS | Status: DC
  Administered 2018-12-23 – 2018-12-27 (×12): 500 mg via INTRAVENOUS
  Filled 2018-12-23 (×12): qty 100

## 2018-12-23 MED ORDER — SODIUM CHLORIDE 0.9 % IV BOLUS
1000.0000 mL | Freq: Once | INTRAVENOUS | Status: AC
  Administered 2018-12-23: 1000 mL via INTRAVENOUS

## 2018-12-23 MED ORDER — SODIUM CHLORIDE 0.9 % IV SOLN: 250.0000 mL | INTRAVENOUS | Status: DC

## 2018-12-23 MED ORDER — METRONIDAZOLE IN NACL 5-0.79 MG/ML-% IV SOLN: 500.0000 mg | Freq: Three times a day (TID) | INTRAVENOUS | Status: DC

## 2018-12-23 MED ORDER — SODIUM CHLORIDE 0.9 % IV SOLN
INTRAVENOUS | Status: DC
  Administered 2018-12-23: 17:00:00 via INTRAVENOUS

## 2018-12-23 MED ORDER — SODIUM CHLORIDE 0.9 % IV SOLN
2.0000 g | Freq: Once | INTRAVENOUS | Status: AC
  Administered 2018-12-23: 2 g via INTRAVENOUS
  Filled 2018-12-23: qty 2

## 2018-12-23 MED ORDER — LACTULOSE 10 GM/15ML PO SOLN
30.0000 g | Freq: Three times a day (TID) | ORAL | Status: DC
  Administered 2018-12-23 – 2018-12-26 (×6): 30 g via ORAL
  Filled 2018-12-23: qty 45
  Filled 2018-12-23: qty 60
  Filled 2018-12-23 (×7): qty 45

## 2018-12-23 MED ORDER — NOREPINEPHRINE BITARTRATE 1 MG/ML IV SOLN
2.0000 ug/min | INTRAVENOUS | Status: DC
  Filled 2018-12-23: qty 4

## 2018-12-23 MED ORDER — SODIUM CHLORIDE 0.9 % IV BOLUS
500.0000 mL | Freq: Once | INTRAVENOUS | Status: AC
  Administered 2018-12-23: 500 mL via INTRAVENOUS

## 2018-12-23 MED ORDER — NOREPINEPHRINE BITARTRATE 1 MG/ML IV SOLN
2.0000 ug/min | INTRAVENOUS | Status: DC
  Administered 2018-12-23: 2 ug/min via INTRAVENOUS
  Filled 2018-12-23: qty 4

## 2018-12-23 MED ORDER — SODIUM CHLORIDE 0.9 % IV SOLN
250.0000 mL | INTRAVENOUS | Status: DC
  Administered 2018-12-23 – 2018-12-24 (×4): 250 mL via INTRAVENOUS

## 2018-12-23 MED ORDER — SODIUM BICARBONATE 8.4 % IV SOLN
INTRAVENOUS | Status: DC
  Administered 2018-12-23 – 2018-12-24 (×2): via INTRAVENOUS
  Filled 2018-12-23: qty 150

## 2018-12-23 NOTE — ED Notes (Signed)
Bed: RESA Expected date:  Expected time:  Means of arrival:  Comments: Room 3

## 2018-12-23 NOTE — ED Notes (Signed)
Bed: YW03 Expected date: 12/23/18 Expected time: 2:57 PM Means of arrival:  Comments: SBO

## 2018-12-23 NOTE — ED Notes (Signed)
Patient transported to CT 

## 2018-12-23 NOTE — Progress Notes (Signed)
A consult was received from an ED physician for Cefepime per pharmacy dosing (for an indication other than meningitis). The patient's profile has been reviewed for ht/wt/allergies/indication/available labs. A one time order has been placed for the above antibiotics.  Further antibiotics/pharmacy consults should be ordered by admitting physician if indicated.                       Reuel Boom, PharmD, BCPS [phone] 12/23/2018, 5:46 PM

## 2018-12-23 NOTE — ED Notes (Signed)
ED TO INPATIENT HANDOFF REPORT  Name/Age/Gender [name] 63 y.o. female  Code Status    Code Status Orders  (From admission, onward)         Start     Ordered   12/23/18 2137  Full code  Continuous     12/23/18 2137        Code Status History    Date Active Date Inactive Code Status Order ID Comments User Context   12/23/2018 2014 12/23/2018 2137 Full Code [id number]  Dorie Rank, MD ED   12/23/2018 1907 12/23/2018 2014 DNR [id number]  Dorie Rank, MD ED   12/07/2018 1223 12/09/2018 1610 DNR [id number]  Fuller Canada, PA-C Inpatient   12/06/2018 0001 12/07/2018 1223 Full Code [id number]  Lance Coon, MD Inpatient   11/26/2018 2121 11/29/2018 1944 Full Code [id number]  Salary, Avel Peace, MD ED   11/09/2018 1951 11/20/2018 1804 Full Code [id number]  [name], MD Inpatient   10/25/2018 0251 10/29/2018 2045 Full Code [id number]  Lance Coon, MD Inpatient   05/19/2018 1958 05/21/2018 1712 Full Code [id number]  Fritzi Mandes, MD Inpatient   11/27/2017 0109 11/29/2017 1844 Full Code [id number]  Gorden Harms, MD Inpatient   09/26/2016 1313 09/27/2016 1802 Full Code [id number]  Dustin Flock, MD ED      Home/SNF/Other Nursing Home  Chief Complaint Bowel Obstruction  Level of Care/Admitting Diagnosis ED Disposition    ED Disposition Condition [place]: [facility] [100100]  Level of Care: ICU [6]  Diagnosis: Hypotension [196222]  Admitting Physician: Aldean Jewett [[id number]]  Attending Physician: Aldean Jewett [phone]  Estimated length of stay: 3 - 4 days  Certification:: I certify this patient will need inpatient services for at least 2 midnights  PT Class (Do Not Modify): Inpatient [101]  PT Acc Code (Do Not Modify): Private [1]       Medical History Past Medical History:  Diagnosis Date  . Alcoholic liver disease ([place])   . Cancer ([place])    skin  . Depression   . ETOH abuse   . GERD (gastroesophageal reflux  disease)   . PAF (paroxysmal atrial fibrillation) ([facility])     Allergies No Known Allergies  IV Location/Drains/Wounds Patient Lines/Drains/Airways Status   Active Line/Drains/Airways    Name:   Placement date:   Placement time:   Site:   Days:   Peripheral IV 12/07/18 Right;Posterior Wrist   12/07/18    0532    Wrist   16   Peripheral IV 12/23/18 Right Wrist   12/23/18    1610    Wrist   less than 1   Peripheral IV 12/23/18 Left Wrist   12/23/18    1832    Wrist   less than 1   Peripheral IV 12/23/18 Left;Upper Arm   12/23/18    1931    Arm   less than 1   External Urinary Catheter   12/23/18    1631    -   less than 1   Incision (Closed) 11/10/18 Abdomen Right;Lower   11/10/18    -     43   Pressure Injury 12/06/18 Stage I -  Intact skin with non-blanchable redness of a localized area usually over a bony prominence.   12/06/18    0010     17          Labs/Imaging Results for orders placed or performed during the hospital encounter of 12/23/18 (from  the past 48 hour(s))  Comprehensive metabolic panel     Status: Abnormal   Collection Time: 12/23/18  4:09 PM  Result Value Ref Range   Sodium 134 (L) 135 - 145 mmol/L   Potassium 4.6 3.5 - 5.1 mmol/L   Chloride 106 98 - 111 mmol/L   CO2 16 (L) 22 - 32 mmol/L   Glucose, Bld 104 (H) 70 - 99 mg/dL   BUN 54 (H) 8 - 23 mg/dL   Creatinine, Ser 4.91 (H) 0.44 - 1.00 mg/dL   Calcium 8.6 (L) 8.9 - 10.3 mg/dL   Total Protein 5.8 (L) 6.5 - 8.1 g/dL   Albumin 2.4 (L) 3.5 - 5.0 g/dL   AST 48 (H) 15 - 41 U/L   ALT 25 0 - 44 U/L   Alkaline Phosphatase 107 38 - 126 U/L   Total Bilirubin 1.0 0.3 - 1.2 mg/dL   GFR calc non Af Amer 9 (L) >60 mL/min   GFR calc Af Amer 10 (L) >60 mL/min   Anion gap 12 5 - 15    Comment: Performed at [facility], [place] [address], [place], [place] [address]  Lipase, blood     Status: None   Collection Time: 12/23/18  4:09 PM  Result Value Ref Range   Lipase 36 11 - 51 U/L    Comment: Performed  at [facility], [place] [address], [place], [place] [address]  CBC WITH DIFFERENTIAL     Status: Abnormal   Collection Time: 12/23/18  4:09 PM  Result Value Ref Range   WBC 15.1 (H) 4.0 - 10.5 K/uL   RBC 2.97 (L) 3.87 - 5.11 MIL/uL   Hemoglobin 9.4 (L) 12.0 - 15.0 g/dL   HCT 29.5 (L) 36.0 - 46.0 %   MCV 99.3 80.0 - 100.0 fL   MCH 31.6 26.0 - 34.0 pg   MCHC 31.9 30.0 - 36.0 g/dL   RDW 14.7 11.5 - 15.5 %   Platelets 285 150 - 400 K/uL   nRBC 0.0 0.0 - 0.2 %   Neutrophils Relative % 77 %   Neutro Abs 11.6 (H) 1.7 - 7.7 K/uL   Lymphocytes Relative 14 %   Lymphs Abs 2.1 0.7 - 4.0 K/uL   Monocytes Relative 8 %   Monocytes Absolute 1.2 (H) 0.1 - 1.0 K/uL   Eosinophils Relative 0 %   Eosinophils Absolute 0.0 0.0 - 0.5 K/uL   Basophils Relative 0 %   Basophils Absolute 0.1 0.0 - 0.1 K/uL   Immature Granulocytes 1 %   Abs Immature Granulocytes 0.09 (H) 0.00 - 0.07 K/uL    Comment: Performed at [facility], [place] [address] [place], [place] [address]  Protime-INR     Status: None   Collection Time: 12/23/18  4:09 PM  Result Value Ref Range   Prothrombin Time 13.1 11.4 - 15.2 seconds   INR 1.00     Comment: Performed at [facility], [place] [address], [place], [place] [address]  Ammonia     Status: None   Collection Time: 12/23/18  4:10 PM  Result Value Ref Range   Ammonia 18 9 - 35 umol/L    Comment: Performed at [facility], [place] [address] [place],  [address]  I-Stat CG4 Lactic Acid, ED     Status: None   Collection Time: 12/23/18  6:02 PM  Result Value Ref Range   Lactic Acid, Venous 1.42 0.5 - 1.9 mmol/L  Blood culture (routine x 2)     Status: None (Preliminary result)   Collection Time: 12/23/18  6:08 PM  Result Value Ref Range   Specimen Description BLOOD LEFT ARM    Special Requests      BOTTLES DRAWN AEROBIC AND ANAEROBIC Blood Culture adequate volume Performed at [place] [address], [place], [place] [address]    Culture PENDING    Report Status PENDING   Blood culture (routine x 2)     Status: None (Preliminary result)   Collection Time: 12/23/18  6:16 PM  Result Value Ref Range   Specimen Description BLOOD RIGHT WRIST    Special Requests      BOTTLES DRAWN AEROBIC AND ANAEROBIC Blood Culture results may not be optimal due to an inadequate volume of blood received in culture bottles Performed at [facility], [place] [address] [place], [place] [address]    Culture PENDING    Report Status PENDING   I-Stat CG4 Lactic Acid, ED     Status: None   Collection Time: 12/23/18  8:15 PM  Result Value Ref Range   Lactic Acid, Venous 1.08 0.5 - 1.9 mmol/L   Ct Abdomen Pelvis Wo Contrast  Result Date: 12/23/2018 CLINICAL DATA:  Pt BIB EMS from [facility] c/o small bowel obstruction. EMS reports pt had xray at facility. Facility recommended pt be brought here. Hx alcoholic cirrhosis, UTI. EXAM: CT ABDOMEN AND PELVIS WITHOUT CONTRAST TECHNIQUE: Multidetector CT imaging of the abdomen and pelvis was performed following the standard protocol without IV contrast. COMPARISON:  CT, 11/09/2018 FINDINGS: Lower chest: Heart is normal in size. There is mild dependent lung base opacity, right greater than left, consistent with atelectasis. Hepatobiliary: Liver is small and nodule reflecting advanced cirrhosis. No liver mass or focal lesion. Dense material is noted within the gallbladder consistent with stones and sludge, stable from the prior study. No bile duct dilation. Pancreas: Unremarkable. No pancreatic ductal dilatation or surrounding inflammatory changes. Spleen: Normal in size without focal abnormality. Adrenals/Urinary Tract: No adrenal masses. Bilobed 2.5 cm exophytic mass from the upper pole the right kidney consistent with a cyst, stable. Bilateral renal cortical thinning. No other masses, no stones and no hydronephrosis. Normal ureters.  Normal bladder. Stomach/Bowel: Stomach is unremarkable. Small bowel and colon are normal in caliber. No bowel wall thickening or inflammation. Normal appendix suggested but not definitively seen. No evidence of appendicitis. Vascular/Lymphatic: Aortic atherosclerosis. No enlarged abdominal or pelvic lymph nodes. Reproductive: Uterus and bilateral adnexa are unremarkable. Other: Large amount of ascites distends the abdomen. No abdominal wall hernia. Musculoskeletal: No fracture or acute finding. No osteoblastic or osteolytic lesions. IMPRESSION: 1. No acute findings.  No bowel obstruction or bowel inflammation. 2. Large amount of ascites, similar to the prior CT. 3. Advanced cirrhosis. 4. Aortic atherosclerosis. Electronically Signed   By: Lajean Manes M.D.   On: 12/23/2018 19:07   None  Pending Labs Unresulted Labs (From admission, onward)    Start     Ordered   12/24/18 0500  CBC  Tomorrow morning,   R     12/23/18 2137   12/24/18 2409  Basic metabolic panel  Tomorrow morning,   R     12/23/18 2137   12/23/18 2142  Culture, Urine  ONCE - STAT,   R     12/23/18 2141   12/23/18 2141  Urinalysis, Routine w reflex microscopic  ONCE - STAT,   R     12/23/18  2141   12/23/18 1549  Urinalysis, Routine w reflex microscopic  ONCE - STAT,   STAT     12/23/18 1549          Vitals/Pain Today's Vitals   12/23/18 2115 12/23/18 2120 12/23/18 2130 12/23/18 2140  BP: (!) 82/57 (!) 84/57 (!) 80/54 91/66  Pulse: 75 78 66 66  Resp: (!) 9 10 (!) 9 (!) 9  Temp:      TempSrc:      SpO2: 99% 99% 99% 99%  Weight:      Height:      PainSc:        Isolation Precautions No active isolations  Medications Medications  sodium chloride 0.9 % bolus 500 mL (0 mLs Intravenous Stopped 12/23/18 1722)    And  0.9 %  sodium chloride infusion ( Intravenous Paused 12/23/18 1742)  metroNIDAZOLE (FLAGYL) IVPB 500 mg (0 mg Intravenous Stopped 12/23/18 1953)  0.9 %  sodium chloride infusion ( Intravenous Rate/Dose Verify  12/23/18 2021)  0.9 %  sodium chloride infusion (has no administration in time range)  norepinephrine (LEVOPHED) 4 mg in dextrose 5 % 250 mL (0.016 mg/mL) infusion (has no administration in time range)  lactulose (CHRONULAC) 10 GM/15ML solution 30 g (has no administration in time range)  midodrine (PROAMATINE) tablet 5 mg (has no administration in time range)  rifaximin (XIFAXAN) tablet 550 mg (has no administration in time range)  ondansetron (ZOFRAN) injection 4 mg (4 mg Intravenous Given 12/23/18 1614)  morphine 4 MG/ML injection 4 mg (4 mg Intravenous Given 12/23/18 1614)  sodium chloride 0.9 % bolus 1,000 mL (0 mLs Intravenous Stopped 12/23/18 1857)    Followed by  sodium chloride 0.9 % bolus 1,000 mL (0 mLs Intravenous Stopped 12/23/18 1957)  ceFEPIme (MAXIPIME) 2 g in sodium chloride 0.9 % 100 mL IVPB (0 g Intravenous Stopped 12/23/18 1857)  sodium chloride 0.9 % bolus 1,000 mL (0 mLs Intravenous Stopped 12/23/18 2122)    Mobility walks with person assist

## 2018-12-23 NOTE — Progress Notes (Signed)
Pharmacy Antibiotic Note  [name] is a 63 y.o. female admitted on 12/23/2018 with sepsisIntra-abdominal infection .  Pharmacy has been consulted for Cefepime dosing.  Plan: Cefepime 2gm iv x1, then 1gm iv q24hr  Height: 5\' 4"  (162.6 cm) Weight: 139 lb 15.9 oz (63.5 kg) IBW/kg (Calculated) : 54.7  Temp (24hrs), Avg:99 F (37.2 C), Min:98 F (36.7 C), Max:99.9 F (37.7 C)  Recent Labs  Lab 12/23/18 1609 12/23/18 1802 12/23/18 2015  WBC 15.1*  --   --   CREATININE 4.91*  --   --   LATICACIDVEN  --  1.42 1.08    Estimated Creatinine Clearance: 10.1 mL/min (A) (by C-G formula based on SCr of 4.91 mg/dL (H)).    No Known Allergies  Antimicrobials this admission: Cefepime 12/23/2018 >> Flagyl 12/23/2018 >>   Dose adjustments this admission: -  Microbiology results: -  Thank you for allowing pharmacy to be a part of this patient's care.  Nani Skillern Crowford 12/23/2018 10:00 PM

## 2018-12-23 NOTE — ED Provider Notes (Addendum)
[facility] DEPT Provider Note   CSN: [id number] Arrival date & time: 12/23/18  1505     History   Chief Complaint Chief Complaint  Patient presents with  . GI Problem    HPI [name] is a 63 y.o. female.  HPI Pt was sent from her nursing facility for possible bowel obstruction.  Pt states her abdomen is sore and it hurts very bad. The sx started today.   She also states she has been vomiting.  Maybe three or 4 times.  She is not sure when her last bowel movement was.  No fevers.   No shortness of breath.  Pt resides at a nursing facility.  Per EMS report she had xrays.   Xray report from the facility states partial SBO vs ileus.   Pt has hx of multiple medical problems including alcoholic cirrhosis.  Pt is also confused which is not new per medical records.  She mentions a very big dog here that she sees in the ED and it is unclear if she is seeing that now but a therapy dog was walking around here today in the ED. Past Medical History:  Diagnosis Date  . Alcoholic liver disease ([place])   . Cancer ([place])    skin  . Depression   . ETOH abuse   . GERD (gastroesophageal reflux disease)   . PAF (paroxysmal atrial fibrillation) [facility])     Patient Active Problem List   Diagnosis Date Noted  . Reflux esophagitis   . Stomach irritation   . Postpyloric ulcer   . Coffee ground emesis   . Hypotension   . Palliative care encounter   . DNR (do not resuscitate)   . Pressure injury of skin 12/06/2018  . UTI (urinary tract infection) 12/05/2018  . Acute on chronic kidney failure ([place]) 12/05/2018  . Alcoholic liver disease ([place]) 12/05/2018  . GERD (gastroesophageal reflux disease) 12/05/2018  . Encephalopathy chronic 11/26/2018  . Weakness generalized   . Encephalopathy   . Palliative care by specialist   . Ascites 11/11/2018  . Renal failure 11/09/2018  . Dysthymia 10/26/2018  . Sepsis ([place]) 10/25/2018  . ETOH abuse 10/25/2018  . PAF  (paroxysmal atrial fibrillation) ([place]) 11/26/2017  . Malnutrition of moderate degree 09/27/2016  . Acute pancreatitis 09/26/2016    Past Surgical History:  Procedure Laterality Date  . ESOPHAGOGASTRODUODENOSCOPY N/A 12/08/2018   Procedure: ESOPHAGOGASTRODUODENOSCOPY (EGD);  Surgeon: Virgel Manifold, MD;  Location[facility] ENDOSCOPY;  Service: Endoscopy;  Laterality: N/A;  . MOLE REMOVAL       OB History   No obstetric history on file.      Home Medications    Prior to Admission medications   Medication Sig Start Date End Date Taking? Authorizing Provider  feeding supplement, ENSURE ENLIVE, (ENSURE ENLIVE) LIQD Take 237 mLs by mouth 2 (two) times daily between meals. 12/09/18  Yes Fritzi Mandes, MD  lactulose (CHRONULAC) 10 GM/15ML solution Take 30 g by mouth 3 (three) times daily.   Yes [provider]  metoprolol tartrate (LOPRESSOR) 25 MG tablet Take 0.5 tablets (12.5 mg total) by mouth daily. Hold if sbp <110 12/09/18  Yes Fritzi Mandes, MD  midodrine (PROAMATINE) 5 MG tablet Take 5 mg by mouth 3 (three) times daily with meals.   Yes [provider]  multivitamin-lutein (OCUVITE-LUTEIN) CAPS capsule Take 1 capsule by mouth daily. 12/10/18  Yes Fritzi Mandes, MD  pantoprazole (PROTONIX) 40 MG tablet Take 1 tablet (40 mg total)  by mouth 2 (two) times daily. 12/09/18  Yes Fritzi Mandes, MD  rifaximin (XIFAXAN) 550 MG TABS tablet Take 550 mg by mouth 2 (two) times daily.    Yes [provider]  spironolactone (ALDACTONE) 25 MG tablet Take 0.5 tablets (12.5 mg total) by mouth daily. 12/09/18  Yes Fritzi Mandes, MD  thiamine 100 MG tablet Take 100 mg by mouth daily.   Yes [provider]  cephALEXin (KEFLEX) 250 MG capsule Take 1 capsule (250 mg total) by mouth every 12 (twelve) hours. Patient not taking: Reported on 12/23/2018 12/09/18   Fritzi Mandes, MD    Family History Family History  Problem Relation Age of Onset  . Heart disease Mother   . Cancer  Father   . Breast cancer Neg Hx     Social History Social History   Tobacco Use  . Smoking status: Current Every Day Smoker    Packs/day: 0.25    Types: Cigarettes  . Smokeless tobacco: Never Used  Substance Use Topics  . Alcohol use: Yes    Alcohol/week: 1.0 standard drinks    Types: 1 Shots of liquor per week  . Drug use: No     Allergies   Patient has no known allergies.   Review of Systems Review of Systems  All other systems reviewed and are negative.    Physical Exam Updated Vital Signs BP (!) 84/57   Pulse 77   Temp 99.9 F (37.7 C) (Rectal)   Resp 16   Ht 1.626 m (5\' 4" )   Wt 63.5 kg   SpO2 100%   BMI 24.03 kg/m   Physical Exam Vitals signs and nursing note reviewed.  Constitutional:      General: She is not in acute distress.    Appearance: She is well-developed. She is ill-appearing.     Comments: thin  HENT:     Head: Normocephalic and atraumatic.     Right Ear: External ear normal.     Left Ear: External ear normal.  Eyes:     General: No scleral icterus.       Right eye: No discharge.        Left eye: No discharge.     Conjunctiva/sclera: Conjunctivae normal.  Neck:     Musculoskeletal: Neck supple.     Trachea: No tracheal deviation.  Cardiovascular:     Rate and Rhythm: Normal rate and regular rhythm.  Pulmonary:     Effort: Pulmonary effort is normal. No respiratory distress.     Breath sounds: Normal breath sounds. No stridor. No wheezing or rales.  Abdominal:     General: Bowel sounds are normal. There is distension.     Palpations: Abdomen is soft.     Tenderness: There is abdominal tenderness. There is guarding. There is no rebound.  Musculoskeletal:        General: No tenderness.  Skin:    General: Skin is warm and dry.     Findings: No rash.  Neurological:     Mental Status: She is alert. She is confused.     Cranial Nerves: No cranial nerve deficit (no facial droop, extraocular movements intact, no slurred speech).      Sensory: No sensory deficit.     Motor: No abnormal muscle tone or seizure activity.     Coordination: Coordination normal.      ED Treatments / Results  Labs (all labs ordered are listed, but only abnormal results are displayed) Labs Reviewed  COMPREHENSIVE METABOLIC PANEL -  Abnormal; Notable for the following components:      Result Value   Sodium 134 (*)    CO2 16 (*)    Glucose, Bld 104 (*)    BUN 54 (*)    Creatinine, Ser 4.91 (*)    Calcium 8.6 (*)    Total Protein 5.8 (*)    Albumin 2.4 (*)    AST 48 (*)    GFR calc non Af Amer 9 (*)    GFR calc Af Amer 10 (*)    All other components within normal limits  CBC WITH DIFFERENTIAL/PLATELET - Abnormal; Notable for the following components:   WBC 15.1 (*)    RBC 2.97 (*)    Hemoglobin 9.4 (*)    HCT 29.5 (*)    Neutro Abs 11.6 (*)    Monocytes Absolute 1.2 (*)    Abs Immature Granulocytes 0.09 (*)    All other components within normal limits  CULTURE, BLOOD (ROUTINE X 2)  CULTURE, BLOOD (ROUTINE X 2)  LIPASE, BLOOD  PROTIME-INR  AMMONIA  URINALYSIS, ROUTINE W REFLEX MICROSCOPIC  I-STAT CG4 LACTIC ACID, ED  I-STAT CG4 LACTIC ACID, ED    EKG None  Radiology Ct Abdomen Pelvis Wo Contrast  Result Date: 12/23/2018 CLINICAL DATA:  Pt BIB EMS from [facility] c/o small bowel obstruction. EMS reports pt had xray at facility. Facility recommended pt be brought here. Hx alcoholic cirrhosis, UTI. EXAM: CT ABDOMEN AND PELVIS WITHOUT CONTRAST TECHNIQUE: Multidetector CT imaging of the abdomen and pelvis was performed following the standard protocol without IV contrast. COMPARISON:  CT, 11/09/2018 FINDINGS: Lower chest: Heart is normal in size. There is mild dependent lung base opacity, right greater than left, consistent with atelectasis. Hepatobiliary: Liver is small and nodule reflecting advanced cirrhosis. No liver mass or focal lesion. Dense material is noted within the gallbladder consistent with stones and sludge,  stable from the prior study. No bile duct dilation. Pancreas: Unremarkable. No pancreatic ductal dilatation or surrounding inflammatory changes. Spleen: Normal in size without focal abnormality. Adrenals/Urinary Tract: No adrenal masses. Bilobed 2.5 cm exophytic mass from the upper pole the right kidney consistent with a cyst, stable. Bilateral renal cortical thinning. No other masses, no stones and no hydronephrosis. Normal ureters. Normal bladder. Stomach/Bowel: Stomach is unremarkable. Small bowel and colon are normal in caliber. No bowel wall thickening or inflammation. Normal appendix suggested but not definitively seen. No evidence of appendicitis. Vascular/Lymphatic: Aortic atherosclerosis. No enlarged abdominal or pelvic lymph nodes. Reproductive: Uterus and bilateral adnexa are unremarkable. Other: Large amount of ascites distends the abdomen. No abdominal wall hernia. Musculoskeletal: No fracture or acute finding. No osteoblastic or osteolytic lesions. IMPRESSION: 1. No acute findings.  No bowel obstruction or bowel inflammation. 2. Large amount of ascites, similar to the prior CT. 3. Advanced cirrhosis. 4. Aortic atherosclerosis. Electronically Signed   By: Lajean Manes M.D.   On: 12/23/2018 19:07    Procedures .Critical Care Performed by: Dorie Rank, MD Authorized by: Dorie Rank, MD   Critical care provider statement:    Critical care time (minutes):  45   Critical care was time spent personally by me on the following activities:  Discussions with consultants, evaluation of patient's response to treatment, examination of patient, ordering and performing treatments and interventions, ordering and review of laboratory studies, ordering and review of radiographic studies, pulse oximetry, re-evaluation of patient's condition, obtaining history from patient or surrogate and review of old charts   (including critical care time)  Medications  Ordered in ED Medications  sodium chloride 0.9 %  bolus 500 mL (0 mLs Intravenous Stopped 12/23/18 1722)    And  0.9 %  sodium chloride infusion ( Intravenous Paused 12/23/18 1742)  metroNIDAZOLE (FLAGYL) IVPB 500 mg (0 mg Intravenous Stopped 12/23/18 1953)  0.9 %  sodium chloride infusion ( Intravenous Rate/Dose Verify 12/23/18 2021)  norepinephrine (LEVOPHED) 4 mg in dextrose 5 % 250 mL (0.016 mg/mL) infusion (5 mcg/min Intravenous Rate/Dose Verify 12/23/18 2021)  sodium chloride 0.9 % bolus 1,000 mL ( Intravenous Rate/Dose Verify 12/23/18 2021)  ondansetron (ZOFRAN) injection 4 mg (4 mg Intravenous Given 12/23/18 1614)  morphine 4 MG/ML injection 4 mg (4 mg Intravenous Given 12/23/18 1614)  sodium chloride 0.9 % bolus 1,000 mL (0 mLs Intravenous Stopped 12/23/18 1857)    Followed by  sodium chloride 0.9 % bolus 1,000 mL (0 mLs Intravenous Stopped 12/23/18 1957)  ceFEPIme (MAXIPIME) 2 g in sodium chloride 0.9 % 100 mL IVPB (0 g Intravenous Stopped 12/23/18 1857)     Initial Impression / Assessment and Plan / ED Course  I have reviewed the triage vital signs and the nursing notes.  Pertinent labs & imaging results that were available during my care of the patient were reviewed by me and considered in my medical decision making (see chart for details).  Clinical Course as of Dec 23 2045  Sat Dec 23, 2018  1741 Notified that BP is low.  WBC elevated.  Will give additional fluid bolus.   [GY]  1749 Kidney function is worse.    [JK]  1741 WBC [place] elevated,    [JK]  1850 Lactic acid normal.  Argues against sepsis.  ?related to AKI, dehydration   [JK]  1904 BP remains low.  Pt has received 2 liters.  Will start epi peripherally   [JK]  2009 BP improving on pressors.      [JK]  2014 Updated husband on status.  He states pt would want CPR or any measures necessary.   [JK]  2017 CT scan does not show a bowel obstruction.   Ascites noted on CT.   [JK]  2046 Case discussed with PCCM.  They will evaluate pt for admission   [JK]    Clinical Course User  Index [JK] Dorie Rank, MD    Pt presented with abdominal pain.  Possible bowel obstruction.  CT scan however does not show evidence of bowel obstruction.  It does show her persistent ascites.  Patient will likely require recurrent paracentesis.  Patient's laboratory tests are otherwise notable for acute on kidney injury.  While the patient was here in the emergency room her blood pressure was decreased. At baseline her blood pressure seems to run in the low 100s in the high 90s. She was given fluids and then started on vasopressors.  Possible sepsis although lactic acid is not elevated.  Sx may be related to hepatorenal syndrome.      Plan on admission for further treatment.  Final Clinical Impressions(s) / ED Diagnoses   Final diagnoses:  Ascites due to alcoholic cirrhosis ([place])  AKI (acute kidney injury) ([place])      Dorie Rank, MD 12/23/18 2042    Dorie Rank, MD 12/23/18 2047

## 2018-12-23 NOTE — H&P (Signed)
PULMONARY / CRITICAL CARE MEDICINE   NAME:  [name], MRN:  [id number], DOB:  1956-07-01, LOS: 0 ADMISSION DATE:  12/23/2018, CONSULTATION DATE: 12/23/2018 REFERRING MD: Emergency room physician, CHIEF COMPLAINT: Abdominal distention  BRIEF HISTORY:    63 year old lady with alcoholic liver cirrhosis coming in with hypotension massive ascites and acute on chronic kidney disease. HISTORY OF PRESENT ILLNESS   Mr. [name] is a 63 year old lady with alcoholic liver cirrhosis who is coming in from nursing home with suspicion for bowel obstruction domino pain associated with four-time vomiting no hematemesis.  Patient came to the emergency room and CT abdomen was done which did not show any obstruction but showed some massive ascites.  Patient denies any fever chills or rigors she is a very poor historian denies any chest pain or shortness of breath.  She occasionally sees things and makes nonsense statements.  Patient was found to be also hypotensive and received 3 L of IV fluids and started on Levophed drip.   SIGNIFICANT PAST MEDICAL HISTORY   Liver cirrhosis secondary alcohol abuse -Chronic hypotension based on systolic 90 -Paroxysmal atrial fibrillation  SIGNIFICANT EVENTS:   STUDIES:   CT abdomen 12/23/2018 1. No acute findings.  No bowel obstruction or bowel inflammation. 2. Large amount of ascites, similar to the prior CT. 3. Advanced cirrhosis. 4. Aortic atherosclerosis.  CULTURES:  >>Blood culture Urine culture>>   ANTIBIOTICS:  Cefepime 12/23/2018>> Flagyl 12/23/2018>>  LINES/TUBES:    CONSULTANTS:   SUBJECTIVE:    CONSTITUTIONAL: BP (!) 84/57   Pulse 78   Temp 99.9 F (37.7 C) (Rectal)   Resp 10   Ht 5\' 4"  (1.626 m)   Wt 63.5 kg   SpO2 99%   BMI 24.03 kg/m   I/O last 3 completed shifts: In: 1600 [IV Piggyback:1600] Out: -         PHYSICAL EXAM: General: Acutely ill not in distress Neuro: Alert oriented to time and place but very slow  nonfocal HEENT: Dry mucous membranes Cardiovascular: Normal heart sound no added sounds or murmurs Lungs: Clear equal air sounds bilaterally no crackles no wheezing Abdomen: Massively distended with ascites dull not tender no guarding Musculoskeletal: No lower limb edema Skin: No skin rash  RESOLVED PROBLEM LIST   ASSESSMENT AND PLAN   Assessment: -Hypotension possibly related to hypovolemic shock. septic shock cannot be ruled out -Acute kidney injury -Hyponatremia -Metabolic acidosis -Liver cirrhosis secondary to alcohol abuse -Massive ascites -Hepatic encephalopathy   Plan: -Admit to the intensive care unit for further management -Patient received 3 L of IV fluid boluses we will start sodium bicarb drip -Titrate Levophed to keep map above 60 -Follow urine output and renal function -Continue on cefepime and Flagyl -Follow blood culture -I will send for urinalysis and urine culture -Restart home Midrin -Restart lactulose -Restart rifaximin -Hold diuretics and antihypertensives -Consider paracentesis therapeutic and diagnostic in the morning when blood pressure improves -SCDs for DVT prophylaxis  I have spent 40 minutes of critical care time this time was spent bedside or in the unit this time was exclusive any billable procedures patient needing intensive care due to hypovolemic/septic shock requiring vasopressors and titration   SUMMARY OF TODAY'S PLAN:    Best Practice / Goals of Care / Disposition.   DVT PROPHYLAXIS: SCD SUP: None NUTRITION: Cardiac diet MOBILITY: Bedrest GOALS OF CARE: Full code FAMILY DISCUSSIONS: No family around DISPOSITION ICU admission  LABS  Glucose No results for input(s): GLUCAP in the last 168 hours.  BMET Recent  Labs  Lab 12/23/18 1609  NA 134*  K 4.6  CL 106  CO2 16*  BUN 54*  CREATININE 4.91*  GLUCOSE 104*    Liver Enzymes Recent Labs  Lab 12/23/18 1609  AST 48*  ALT 25  ALKPHOS 107  BILITOT 1.0  ALBUMIN 2.4*     Electrolytes Recent Labs  Lab 12/23/18 1609  CALCIUM 8.6*    CBC Recent Labs  Lab 12/23/18 1609  WBC 15.1*  HGB 9.4*  HCT 29.5*  PLT 285    ABG No results for input(s): PHART, PCO2ART, PO2ART in the last 168 hours.  Coag's Recent Labs  Lab 12/23/18 1609  INR 1.00    Sepsis Markers Recent Labs  Lab 12/23/18 1802 12/23/18 2015  LATICACIDVEN 1.42 1.08    Cardiac Enzymes No results for input(s): TROPONINI, PROBNP in the last 168 hours.  PAST MEDICAL HISTORY :   She  has a past medical history of Alcoholic liver disease ([place]), Cancer ([place]), Depression, ETOH abuse, GERD (gastroesophageal reflux disease), and PAF (paroxysmal atrial fibrillation) ([place]).  PAST SURGICAL HISTORY:  She  has a past surgical history that includes Mole removal and Esophagogastroduodenoscopy (N/A, 12/08/2018).  No Known Allergies  No current facility-administered medications on file prior to encounter.    Current Outpatient Medications on File Prior to Encounter  Medication Sig  . feeding supplement, ENSURE ENLIVE, (ENSURE ENLIVE) LIQD Take 237 mLs by mouth 2 (two) times daily between meals.  . lactulose (CHRONULAC) 10 GM/15ML solution Take 30 g by mouth 3 (three) times daily.  . metoprolol tartrate (LOPRESSOR) 25 MG tablet Take 0.5 tablets (12.5 mg total) by mouth daily. Hold if sbp <110  . midodrine (PROAMATINE) 5 MG tablet Take 5 mg by mouth 3 (three) times daily with meals.  . multivitamin-lutein (OCUVITE-LUTEIN) CAPS capsule Take 1 capsule by mouth daily.  . pantoprazole (PROTONIX) 40 MG tablet Take 1 tablet (40 mg total) by mouth 2 (two) times daily.  . rifaximin (XIFAXAN) 550 MG TABS tablet Take 550 mg by mouth 2 (two) times daily.   Marland Kitchen spironolactone (ALDACTONE) 25 MG tablet Take 0.5 tablets (12.5 mg total) by mouth daily.  Marland Kitchen thiamine 100 MG tablet Take 100 mg by mouth daily.  . cephALEXin (KEFLEX) 250 MG capsule Take 1 capsule (250 mg total) by mouth every 12 (twelve) hours.  (Patient not taking: Reported on 12/23/2018)    FAMILY HISTORY:   Her family history includes Cancer in her father; Heart disease in her mother. There is no history of Breast cancer.  SOCIAL HISTORY:  She  reports that she has been smoking cigarettes. She has been smoking about 0.25 packs per day. She has never used smokeless tobacco. She reports current alcohol use of about 1.0 standard drinks of alcohol per week. She reports that she does not use drugs.  REVIEW OF SYSTEMS:    All 11 point system review were unremarkable other than what is mentioned history of present illness

## 2018-12-23 NOTE — ED Triage Notes (Signed)
Pt BIB EMS from [facility] c/o small bowel obstruction.  EMS reports pt had xray at facility.  Facility recommended pt be brought here. Hx alcoholic cirrhosis, UTI.

## 2018-12-24 DIAGNOSIS — R579 Shock, unspecified: Secondary | ICD-10-CM

## 2018-12-24 DIAGNOSIS — K7031 Alcoholic cirrhosis of liver with ascites: Secondary | ICD-10-CM

## 2018-12-24 DIAGNOSIS — Z66 Do not resuscitate: Secondary | ICD-10-CM

## 2018-12-24 DIAGNOSIS — Z515 Encounter for palliative care: Secondary | ICD-10-CM

## 2018-12-24 DIAGNOSIS — K767 Hepatorenal syndrome: Secondary | ICD-10-CM

## 2018-12-24 LAB — CBC
HCT: 28.6 % — ABNORMAL LOW (ref 36.0–46.0)
Hemoglobin: 8.9 g/dL — ABNORMAL LOW (ref 12.0–15.0)
MCH: 31.3 pg (ref 26.0–34.0)
MCHC: 31.1 g/dL (ref 30.0–36.0)
MCV: 100.7 fL — AB (ref 80.0–100.0)
Platelets: 264 10*3/uL (ref 150–400)
RBC: 2.84 MIL/uL — ABNORMAL LOW (ref 3.87–5.11)
RDW: 14.7 % (ref 11.5–15.5)
WBC: 15.2 10*3/uL — ABNORMAL HIGH (ref 4.0–10.5)
nRBC: 0 % (ref 0.0–0.2)

## 2018-12-24 LAB — BASIC METABOLIC PANEL
Anion gap: 13 (ref 5–15)
BUN: 50 mg/dL — ABNORMAL HIGH (ref 8–23)
CHLORIDE: 106 mmol/L (ref 98–111)
CO2: 15 mmol/L — ABNORMAL LOW (ref 22–32)
Calcium: 7.9 mg/dL — ABNORMAL LOW (ref 8.9–10.3)
Creatinine, Ser: 4.51 mg/dL — ABNORMAL HIGH (ref 0.44–1.00)
GFR calc Af Amer: 11 mL/min — ABNORMAL LOW (ref >60)
GFR calc non Af Amer: 10 mL/min — ABNORMAL LOW (ref >60)
Glucose, Bld: 123 mg/dL — ABNORMAL HIGH (ref 70–99)
Potassium: 3.6 mmol/L (ref 3.5–5.1)
Sodium: 134 mmol/L — ABNORMAL LOW (ref 135–145)

## 2018-12-24 LAB — MRSA PCR SCREENING: MRSA by PCR: NEGATIVE

## 2018-12-24 MED ORDER — MORPHINE SULFATE (PF) 2 MG/ML IV SOLN
2.0000 mg | INTRAVENOUS | Status: DC | PRN
  Administered 2018-12-24 – 2018-12-26 (×10): 2 mg via INTRAVENOUS
  Filled 2018-12-24 (×10): qty 1

## 2018-12-24 MED ORDER — NOREPINEPHRINE 4 MG/250ML-% IV SOLN
2.0000 ug/min | INTRAVENOUS | Status: DC
  Administered 2018-12-24: 7.5 ug/min via INTRAVENOUS
  Filled 2018-12-24: qty 250

## 2018-12-24 NOTE — Progress Notes (Signed)
PCCM:  I spoke with Dr. Barbaraann Cao from [facility] who has agreed to pick up the patient tomorrow.   I have spoke with Dr. Domingo Cocking from Palliative who will see the patient tomorrow.   Comfort is the only goal at this time.   Garner Nash, DO [facility] 12/24/2018 2:27 PM

## 2018-12-24 NOTE — Progress Notes (Signed)
PULMONARY / CRITICAL CARE MEDICINE   NAME:  [name], MRN:  [id number], DOB:  1956/09/21, LOS: 1 ADMISSION DATE:  12/23/2018, CONSULTATION DATE: 12/23/2018 REFERRING MD: Emergency room physician, CHIEF COMPLAINT: Abdominal distention  BRIEF HISTORY:    63 year old lady with alcoholic liver cirrhosis coming in with hypotension massive ascites and acute on chronic kidney disease. HISTORY OF PRESENT ILLNESS   Mr. [name] is a 63 year old lady with alcoholic liver cirrhosis who is coming in from nursing home with suspicion for bowel obstruction domino pain associated with four-time vomiting no hematemesis.  Patient came to the emergency room and CT abdomen was done which did not show any obstruction but showed some massive ascites.  Patient denies any fever chills or rigors she is a very poor historian denies any chest pain or shortness of breath.  She occasionally sees things and makes nonsense statements.  Patient was found to be also hypotensive and received 3 L of IV fluids and started on Levophed drip.  SIGNIFICANT PAST MEDICAL HISTORY   Liver cirrhosis secondary alcohol abuse -Chronic hypotension based on systolic 90 -Paroxysmal atrial fibrillation  SIGNIFICANT EVENTS:   STUDIES:   CT abdomen 12/23/2018 1. No acute findings.  No bowel obstruction or bowel inflammation. 2. Large amount of ascites, similar to the prior CT. 3. Advanced cirrhosis. 4. Aortic atherosclerosis.  CULTURES:  >>Blood culture Urine culture>>   ANTIBIOTICS:  Cefepime 12/23/2018>> Flagyl 12/23/2018>>  LINES/TUBES:   CONSULTANTS:   SUBJECTIVE:   I spoke with the patient's husband.  The patient and husband share a room at [facility] rehab facility.  I explained to her her current clinical situation as well as her prior filled out durable DNR form.  He wishes to honor this form understanding the severity of her illness at this time.  He would like to transition to comfort care measures.  And keep her as  comfortable as possible knowing that she may pass away during this hospitalization.  He unfortunately is unable to come to the hospital to see her.  He is predominantly bedbound at the skilled nursing facility that he resides in.  He wishes that he could see her but he understands that this is what must happen.  This conversation was documented with the nurse at bedside.  She also spoke with the patient's husband [name].  CONSTITUTIONAL: BP (!) 85/66   Pulse 100   Temp (!) 97.5 F (36.4 C) (Oral)   Resp 16   Ht 5\' 4"  (1.626 m)   Wt 68.5 kg   SpO2 99%   BMI 25.92 kg/m   I/O last 3 completed shifts: In: 19 [I.V.:592.7; IV Piggyback:3871.3] Out: -         PHYSICAL EXAM: General appearance: 63 y.o., female, conversant, pleasantly confused Eyes: Sclera mildly icteric, tracking appropriately, no nystagmus HENT: NCAT; oropharynx, MMM Neck: Thin neck, JVP present Lungs: Diminished breath sounds bilaterally CV: Regular, tachycardic S1, S2, no MRGs  Abdomen: Severely distended abdomen, positive fluid wave present Extremities: Bilateral lower extremity edema Skin: Scattered petechia and bruising Psych: Evidence of delirium, hallucinating about cats in the bed Neuro: Alert to person, does not know where she is at believes she is at [facility], thinks that it is springtime.    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  I have spoke with the patient's husband and reviewed the patient's durable DNR that was completed in December 2019.  Patient's husband is aware that she has been sick for some time.  They share a room at the local  nursing home.  He is too sick to come to the hospital.  He has agreed to honor the patient's durable DNR.  Understanding that she is critically ill and will likely pass away during this hospitalization.  He would like to make her as comfortable as possible.  And focus on comfort care measures only.  Therefore I have discontinued the patient's norepinephrine and she has not been on this for the past several hours this morning.  And not needed it.  She has been able to maintain her own pressures. We can continue other symptomatic treatments to include her home dose Midrin, lactulose, rifaximin. Continue to hold diuretics. At this point would not pursue tapping of her ascites. Continue cefepime and Flagyl for the time being.  To treat conservatively.  We will plan no aggressive escalation or intervention.  I have consulted palliative care.  I have spoke with Dr. Domingo Cocking who has agreed to come see the patient tomorrow.  I will transfer the patient from the intensive care unit to the triad hospitalist service.   Greater than 50% of this patient's visit was spent face-to-face counseling and discussing and coordinating care with the patient's husband via phone as well as the patient in the intensive care unit.  SUMMARY OF TODAY'S PLAN:  Comfort care measures  Best Practice / Goals of Care / Disposition.   DVT PROPHYLAXIS: SCD SUP: None NUTRITION: Cardiac diet MOBILITY: Bedrest GOALS OF CARE: Full code FAMILY DISCUSSIONS: No family around DISPOSITION ICU admission  LABS  Glucose No results for input(s): GLUCAP in the last 168 hours.  BMET Recent Labs  Lab 12/23/18 1609 12/24/18 0649  NA 134* 134*  K 4.6 3.6  CL 106 106  CO2 16* 15*  BUN 54* 50*  CREATININE 4.91* 4.51*  GLUCOSE 104* 123*    Liver Enzymes Recent Labs  Lab 12/23/18 1609  AST 48*  ALT 25  ALKPHOS 107  BILITOT 1.0  ALBUMIN 2.4*    Electrolytes Recent Labs  Lab 12/23/18 1609 12/24/18 0649  CALCIUM  8.6* 7.9*    CBC Recent Labs  Lab 12/23/18 1609 12/24/18 0649  WBC 15.1* 15.2*  HGB 9.4* 8.9*  HCT 29.5* 28.6*  PLT 285 264    ABG No results for input(s): PHART, PCO2ART, PO2ART in the last 168 hours.  Coag's Recent Labs  Lab 12/23/18 1609  INR 1.00    Sepsis Markers Recent Labs  Lab 12/23/18 1802 12/23/18 2015  LATICACIDVEN 1.42 1.08    Cardiac Enzymes No results for input(s): TROPONINI, PROBNP in the last 168 hours.   Garner Nash, DO [facility] 12/24/2018 11:22 AM  Personal pager: [phone] If unanswered, please page CCM On-call: [phone]

## 2018-12-24 NOTE — ED Notes (Signed)
ED TO INPATIENT HANDOFF REPORT  Name/Age/Gender [name] 63 y.o. female  Code Status    Code Status Orders  (From admission, onward)         Start     Ordered   12/23/18 2137  Full code  Continuous     12/23/18 2137        Code Status History    Date Active Date Inactive Code Status Order ID Comments User Context   12/23/2018 2014 12/23/2018 2137 Full Code [id number]  Dorie Rank, MD ED   12/23/2018 1907 12/23/2018 2014 DNR [id number]  Dorie Rank, MD ED   12/07/2018 1223 12/09/2018 1610 DNR [id number]  Fuller Canada, PA-C Inpatient   12/06/2018 0001 12/07/2018 1223 Full Code [id number]  Lance Coon, MD Inpatient   11/26/2018 2121 11/29/2018 1944 Full Code [id number]  Salary, Avel Peace, MD ED   11/09/2018 1951 11/20/2018 1804 Full Code [id number]  [name], MD Inpatient   10/25/2018 0251 10/29/2018 2045 Full Code [id number]  Lance Coon, MD Inpatient   05/19/2018 1958 05/21/2018 1712 Full Code [id number]  Fritzi Mandes, MD Inpatient   11/27/2017 0109 11/29/2017 1844 Full Code [id number]  Gorden Harms, MD Inpatient   09/26/2016 1313 09/27/2016 1802 Full Code [id number]  Dustin Flock, MD ED      Home/SNF/Other Nursing Home  Chief Complaint Bowel Obstruction  Level of Care/Admitting Diagnosis ED Disposition    ED Disposition Condition [place]: [facility] [[id number]]  Level of Care: ICU [6]  Diagnosis: Hypotension [741287]  Admitting Physician: Aldean Jewett [[id number]]  Attending Physician: Aldean Jewett [phone]  Estimated length of stay: 5 - 7 days  Certification:: I certify this patient will need inpatient services for at least 2 midnights  PT Class (Do Not Modify): Inpatient [101]  PT Acc Code (Do Not Modify): Private [1]       Medical History Past Medical History:  Diagnosis Date  . Alcoholic liver disease ([place])   . Cancer ([place])    skin  . Depression   . ETOH abuse   . GERD (gastroesophageal reflux  disease)   . PAF (paroxysmal atrial fibrillation) ([place])     Allergies No Known Allergies  IV Location/Drains/Wounds Patient Lines/Drains/Airways Status   Active Line/Drains/Airways    Name:   Placement date:   Placement time:   Site:   Days:   Peripheral IV 12/07/18 Right;Posterior Wrist   12/07/18    0532    Wrist   17   Peripheral IV 12/23/18 Right Wrist   12/23/18    1610    Wrist   1   Peripheral IV 12/23/18 Left Wrist   12/23/18    1832    Wrist   1   Peripheral IV 12/23/18 Left;Upper Arm   12/23/18    1931    Arm   1   External Urinary Catheter   12/23/18    1631    -   1   Incision (Closed) 11/10/18 Abdomen Right;Lower   11/10/18    -     44   Pressure Injury 12/06/18 Stage I -  Intact skin with non-blanchable redness of a localized area usually over a bony prominence.   12/06/18    0010     18          Labs/Imaging Results for orders placed or performed during the hospital encounter of 12/23/18 (from the past 48 hour(s))  Comprehensive metabolic panel  Status: Abnormal   Collection Time: 12/23/18  4:09 PM  Result Value Ref Range   Sodium 134 (L) 135 - 145 mmol/L   Potassium 4.6 3.5 - 5.1 mmol/L   Chloride 106 98 - 111 mmol/L   CO2 16 (L) 22 - 32 mmol/L   Glucose, Bld 104 (H) 70 - 99 mg/dL   BUN 54 (H) 8 - 23 mg/dL   Creatinine, Ser 4.91 (H) 0.44 - 1.00 mg/dL   Calcium 8.6 (L) 8.9 - 10.3 mg/dL   Total Protein 5.8 (L) 6.5 - 8.1 g/dL   Albumin 2.4 (L) 3.5 - 5.0 g/dL   AST 48 (H) 15 - 41 U/L   ALT 25 0 - 44 U/L   Alkaline Phosphatase 107 38 - 126 U/L   Total Bilirubin 1.0 0.3 - 1.2 mg/dL   GFR calc non Af Amer 9 (L) >60 mL/min   GFR calc Af Amer 10 (L) >60 mL/min   Anion gap 12 5 - 15    Comment: Performed at [facility], [place] [address], [place], [place] [address]  Lipase, blood     Status: None   Collection Time: 12/23/18  4:09 PM  Result Value Ref Range   Lipase 36 11 - 51 U/L    Comment: Performed at [facility], [place] [address], [place], [place] [address]  CBC WITH DIFFERENTIAL     Status: Abnormal   Collection Time: 12/23/18  4:09 PM  Result Value Ref Range   WBC 15.1 (H) 4.0 - 10.5 K/uL   RBC 2.97 (L) 3.87 - 5.11 MIL/uL   Hemoglobin 9.4 (L) 12.0 - 15.0 g/dL   HCT 29.5 (L) 36.0 - 46.0 %   MCV 99.3 80.0 - 100.0 fL   MCH 31.6 26.0 - 34.0 pg   MCHC 31.9 30.0 - 36.0 g/dL   RDW 14.7 11.5 - 15.5 %   Platelets 285 150 - 400 K/uL   nRBC 0.0 0.0 - 0.2 %   Neutrophils Relative % 77 %   Neutro Abs 11.6 (H) 1.7 - 7.7 K/uL   Lymphocytes Relative 14 %   Lymphs Abs 2.1 0.7 - 4.0 K/uL   Monocytes Relative 8 %   Monocytes Absolute 1.2 (H) 0.1 - 1.0 K/uL   Eosinophils Relative 0 %   Eosinophils Absolute 0.0 0.0 - 0.5 K/uL   Basophils Relative 0 %   Basophils Absolute 0.1 0.0 - 0.1 K/uL   Immature Granulocytes 1 %   Abs Immature Granulocytes 0.09 (H) 0.00 - 0.07 K/uL    Comment: Performed at [facility], [place] [address], [place], [place] [address]  Protime-INR     Status: None   Collection Time: 12/23/18  4:09 PM  Result Value Ref Range   Prothrombin Time 13.1 11.4 - 15.2 seconds   INR 1.00     Comment: Performed at [facility], [place] [address] [place], [place] [address]  Ammonia     Status: None   Collection Time: 12/23/18  4:10 PM  Result Value Ref Range   Ammonia 18 9 - 35 umol/L    Comment: Performed at [facility], [place] [address], [place], [place] [address]  I-Stat CG4 Lactic Acid, ED     Status: None   Collection Time: 12/23/18  6:02 PM  Result Value Ref Range   Lactic Acid, Venous 1.42 0.5 - 1.9 mmol/L  Blood culture (routine x 2)     Status: None (Preliminary  result)   Collection Time: 12/23/18  6:08 PM  Result Value Ref Range   Specimen Description BLOOD LEFT ARM    Special Requests      BOTTLES DRAWN AEROBIC AND ANAEROBIC Blood Culture adequate volume Performed at [place] [address], [place],  [place] [address]    Culture PENDING    Report Status PENDING   Blood culture (routine x 2)     Status: None (Preliminary result)   Collection Time: 12/23/18  6:16 PM  Result Value Ref Range   Specimen Description BLOOD RIGHT WRIST    Special Requests      BOTTLES DRAWN AEROBIC AND ANAEROBIC Blood Culture results may not be optimal due to an inadequate volume of blood received in culture bottles Performed at [facility], [place] [address], [place], [place] [address]    Culture PENDING    Report Status PENDING   I-Stat CG4 Lactic Acid, ED     Status: None   Collection Time: 12/23/18  8:15 PM  Result Value Ref Range   Lactic Acid, Venous 1.08 0.5 - 1.9 mmol/L   Ct Abdomen Pelvis Wo Contrast  Result Date: 12/23/2018 CLINICAL DATA:  Pt BIB EMS from [facility] c/o small bowel obstruction. EMS reports pt had xray at facility. Facility recommended pt be brought here. Hx alcoholic cirrhosis, UTI. EXAM: CT ABDOMEN AND PELVIS WITHOUT CONTRAST TECHNIQUE: Multidetector CT imaging of the abdomen and pelvis was performed following the standard protocol without IV contrast. COMPARISON:  CT, 11/09/2018 FINDINGS: Lower chest: Heart is normal in size. There is mild dependent lung base opacity, right greater than left, consistent with atelectasis. Hepatobiliary: Liver is small and nodule reflecting advanced cirrhosis. No liver mass or focal lesion. Dense material is noted within the gallbladder consistent with stones and sludge, stable from the prior study. No bile duct dilation. Pancreas: Unremarkable. No pancreatic ductal dilatation or surrounding inflammatory changes. Spleen: Normal in size without focal abnormality. Adrenals/Urinary Tract: No adrenal masses. Bilobed 2.5 cm exophytic mass from the upper pole the right kidney consistent with a cyst, stable. Bilateral renal cortical thinning. No other masses, no stones and no hydronephrosis. Normal ureters. Normal bladder. Stomach/Bowel: Stomach is  unremarkable. Small bowel and colon are normal in caliber. No bowel wall thickening or inflammation. Normal appendix suggested but not definitively seen. No evidence of appendicitis. Vascular/Lymphatic: Aortic atherosclerosis. No enlarged abdominal or pelvic lymph nodes. Reproductive: Uterus and bilateral adnexa are unremarkable. Other: Large amount of ascites distends the abdomen. No abdominal wall hernia. Musculoskeletal: No fracture or acute finding. No osteoblastic or osteolytic lesions. IMPRESSION: 1. No acute findings.  No bowel obstruction or bowel inflammation. 2. Large amount of ascites, similar to the prior CT. 3. Advanced cirrhosis. 4. Aortic atherosclerosis. Electronically Signed   By: Lajean Manes M.D.   On: 12/23/2018 19:07   None  Pending Labs Unresulted Labs (From admission, onward)    Start     Ordered   12/24/18 0500  CBC  Tomorrow morning,   R     12/23/18 2137   12/24/18 5462  Basic metabolic panel  Tomorrow morning,   R     12/23/18 2137   12/23/18 2142  Culture, Urine  ONCE - STAT,   R     12/23/18 2141   12/23/18 2141  Urinalysis, Routine w reflex microscopic  ONCE - STAT,   R     12/23/18 2141   12/23/18 1549  Urinalysis, Routine w reflex microscopic  ONCE - STAT,   STAT     12/23/18 1549          Vitals/Pain Today's Vitals   12/24/18 0000 12/24/18 0030 12/24/18 0045 12/24/18 0100  BP: 105/79 (!) 88/64 (!) 89/67 91/63  Pulse: 83 74 76 75  Resp: 13 10 13 13   Temp:      TempSrc:      SpO2: 100% 100% 99% 100%  Weight:      Height:      PainSc:        Isolation Precautions No active isolations  Medications Medications  metroNIDAZOLE (FLAGYL) IVPB 500 mg (0 mg Intravenous Stopped 12/23/18 1953)  0.9 %  sodium chloride infusion ( Intravenous Rate/Dose Verify 12/23/18 2324)  0.9 %  sodium chloride infusion ( Intravenous Canceled Entry 12/23/18 2323)  norepinephrine (LEVOPHED) 4 mg in dextrose 5 % 250 mL (0.016 mg/mL) infusion (7.5 mcg/min Intravenous Rate/Dose  Verify 12/23/18 2324)  lactulose (CHRONULAC) 10 GM/15ML solution 30 g (30 g Oral Given 12/23/18 2327)  rifaximin (XIFAXAN) tablet 550 mg (550 mg Oral Given 12/23/18 2325)  sodium bicarbonate 150 mEq in dextrose 5 % 1,000 mL infusion ( Intravenous Rate/Dose Verify 12/23/18 2324)  midodrine (PROAMATINE) tablet 10 mg (has no administration in time range)  ceFEPIme (MAXIPIME) 1 g in sodium chloride 0.9 % 100 mL IVPB (has no administration in time range)  sodium chloride 0.9 % bolus 500 mL (0 mLs Intravenous Stopped 12/23/18 1722)  ondansetron (ZOFRAN) injection 4 mg (4 mg Intravenous Given 12/23/18 1614)  morphine 4 MG/ML injection 4 mg (4 mg Intravenous Given 12/23/18 1614)  sodium chloride 0.9 % bolus 1,000 mL (0 mLs Intravenous Stopped 12/23/18 1857)    Followed by  sodium chloride 0.9 % bolus 1,000 mL (0 mLs Intravenous Stopped 12/23/18 1957)  ceFEPIme (MAXIPIME) 2 g in sodium chloride 0.9 % 100 mL IVPB (0 g Intravenous Stopped 12/23/18 1857)  sodium chloride 0.9 % bolus 1,000 mL (0 mLs Intravenous Stopped 12/23/18 2122)    Mobility walks with person assist

## 2018-12-25 ENCOUNTER — Encounter (HOSPITAL_COMMUNITY): Payer: Self-pay

## 2018-12-25 DIAGNOSIS — Z7189 Other specified counseling: Secondary | ICD-10-CM

## 2018-12-25 DIAGNOSIS — K709 Alcoholic liver disease, unspecified: Secondary | ICD-10-CM

## 2018-12-25 NOTE — Progress Notes (Signed)
Patient's family requested to take oxygen probe off finger due to patient trying to pull it off and easily being distracted. Patient comfort care at this time. Oxygen probe removed.

## 2018-12-25 NOTE — Progress Notes (Signed)
PROGRESS NOTE    Chesni Vos Coye  ZOX:[id number] DOB: 26-May-1956 DOA: 12/23/2018 PCP: System, Pcp Not In    Brief Narrative:  63yo with ETOH cirrhosis presented with encephalopathy, found to be hypotensive requiring pressor support. Patient noted to have concerns of multi-organ system failure and conerns of hepatorenal syndrome. Patient wishes noted to be DNR with change to focus on comfort care. Pt transferred to hospitalist service 1/6  Assessment & Plan:   Principal Problem:   Alcoholic liver disease ([place]) Active Problems:   PAF (paroxysmal atrial fibrillation) ([place])   Renal failure   Hypotension   Hepatic encephalopathy ([place])   1. Hypotension 1. Presented pressor dependent 2. CCM discussed case with family who desired more focus on comfort, as such pressors were discontinued with transition to focus on more conservative measures such as empiric abx and lactulose 2. Alcoholic liver disease 1. Most recent bili of 1.0 with ast 48 and alt of 25 2. Ammonia of 18 3. Avoiding hepatotoxic agents 4. Continued on lactulose and xifaxan per below 3. ARF 1. Presenting Cr of 4.91, was 3.08 pon 12/21 2. Concerns of hepatorenal 4. Afib 1. Last noted to be rate controlled 5. Hepatic encephalopathy 1. Currently on lactulose and xifaxan 6. End of Life 1. Palliative Care consulted 2. Family had requested change to focus on comfort care 3. Plan to continue as planned and monitor over the next 24hrs   DVT prophylaxis: comfort measures Code Status: DNR Family Communication: Pt in room Disposition Plan: Uncertain at this time  Consultants:   Palliative Care  Critical Care  Procedures:     Antimicrobials: Anti-infectives (From admission, onward)   Start     Dose/Rate Route Frequency Ordered Stop   12/24/18 1800  ceFEPIme (MAXIPIME) 1 g in sodium chloride 0.9 % 100 mL IVPB     1 g 200 mL/hr over 30 Minutes Intravenous Every 24 hours 12/23/18 2200     12/23/18 2200   rifaximin (XIFAXAN) tablet 550 mg     550 mg Oral 2 times daily 12/23/18 2150     12/23/18 2145  metroNIDAZOLE (FLAGYL) IVPB 500 mg  Status:  Discontinued     500 mg 100 mL/hr over 60 Minutes Intravenous Every 8 hours 12/23/18 2141 12/23/18 2146   12/23/18 1745  ceFEPIme (MAXIPIME) 2 g in sodium chloride 0.9 % 100 mL IVPB     2 g 200 mL/hr over 30 Minutes Intravenous  Once 12/23/18 1743 12/23/18 1857   12/23/18 1745  metroNIDAZOLE (FLAGYL) IVPB 500 mg     500 mg 100 mL/hr over 60 Minutes Intravenous Every 8 hours 12/23/18 1743        Subjective: Confused this AM, but without complaints  Objective: Vitals:   12/25/18 1000 12/25/18 1100 12/25/18 1200 12/25/18 1300  BP:      Pulse: 96 100 (!) 128 85  Resp: (!) 9 (!) 9 10 (!) 9  Temp:      TempSrc:      SpO2: 99% 99% 98% 98%  Weight:      Height:        Intake/Output Summary (Last 24 hours) at 12/25/2018 1347 Last data filed at 12/25/2018 1142 Gross per 24 hour  Intake 550.39 ml  Output -  Net 550.39 ml   Filed Weights   12/23/18 1943 12/24/18 0314 12/25/18 0420  Weight: 63.5 kg 68.5 kg 69.2 kg    Examination:  General exam: Appears calm and comfortable  Respiratory system: Clear to auscultation. Respiratory effort  normal. Cardiovascular system: S1 & S2 heard, RRR. Gastrointestinal system: Abdomen is nondistended, soft and nontender. No organomegaly or masses felt. Normal bowel sounds heard. Central nervous system: Alert and oriented. No focal neurological deficits. Extremities: Symmetric 5 x 5 power. Skin: No rashes, lesions  Psychiatry: Confused, affect appears normal  Data Reviewed: I have personally reviewed following labs and imaging studies  CBC: Recent Labs  Lab 12/23/18 1609 12/24/18 0649  WBC 15.1* 15.2*  NEUTROABS 11.6*  --   HGB 9.4* 8.9*  HCT 29.5* 28.6*  MCV 99.3 100.7*  PLT 285 086   Basic Metabolic Panel: Recent Labs  Lab 12/23/18 1609 12/24/18 0649  NA 134* 134*  K 4.6 3.6  CL 106 106    CO2 16* 15*  GLUCOSE 104* 123*  BUN 54* 50*  CREATININE 4.91* 4.51*  CALCIUM 8.6* 7.9*   GFR: Estimated Creatinine Clearance: 12.2 mL/min (A) (by C-G formula based on SCr of 4.51 mg/dL (H)). Liver Function Tests: Recent Labs  Lab 12/23/18 1609  AST 48*  ALT 25  ALKPHOS 107  BILITOT 1.0  PROT 5.8*  ALBUMIN 2.4*   Recent Labs  Lab 12/23/18 1609  LIPASE 36   Recent Labs  Lab 12/23/18 1610  AMMONIA 18   Coagulation Profile: Recent Labs  Lab 12/23/18 1609  INR 1.00   Cardiac Enzymes: No results for input(s): CKTOTAL, CKMB, CKMBINDEX, TROPONINI in the last 168 hours. BNP (last 3 results) No results for input(s): PROBNP in the last 8760 hours. HbA1C: No results for input(s): HGBA1C in the last 72 hours. CBG: No results for input(s): GLUCAP in the last 168 hours. Lipid Profile: No results for input(s): CHOL, HDL, LDLCALC, TRIG, CHOLHDL, LDLDIRECT in the last 72 hours. Thyroid Function Tests: No results for input(s): TSH, T4TOTAL, FREET4, T3FREE, THYROIDAB in the last 72 hours. Anemia Panel: No results for input(s): VITAMINB12, FOLATE, FERRITIN, TIBC, IRON, RETICCTPCT in the last 72 hours. Sepsis Labs: Recent Labs  Lab 12/23/18 1802 12/23/18 2015  LATICACIDVEN 1.42 1.08    Recent Results (from the past 240 hour(s))  Blood culture (routine x 2)     Status: None (Preliminary result)   Collection Time: 12/23/18  6:08 PM  Result Value Ref Range Status   Specimen Description BLOOD LEFT ARM  Final   Special Requests   Final    BOTTLES DRAWN AEROBIC AND ANAEROBIC Blood Culture adequate volume Performed at [facility] [address], [place], [place] [address]    Culture   Final    NO GROWTH < 24 HOURS Performed at [facility] [address] [place], [place] [address]    Report Status PENDING  Incomplete  Blood culture (routine x 2)     Status: None (Preliminary result)   Collection Time: 12/23/18  6:16 PM  Result Value Ref Range  Status   Specimen Description BLOOD RIGHT WRIST  Final   Special Requests   Final    BOTTLES DRAWN AEROBIC AND ANAEROBIC Blood Culture results may not be optimal due to an inadequate volume of blood received in culture bottles Performed at [facility], [place] [address], [place], [place] [address]    Culture   Final    NO GROWTH < 24 HOURS Performed at [facility] [address] [place], [place] [address]    Report Status PENDING  Incomplete  MRSA PCR Screening     Status: None   Collection Time: 12/24/18  3:04 AM  Result  Value Ref Range Status   MRSA by PCR NEGATIVE NEGATIVE Final    Comment:        The GeneXpert MRSA Assay (FDA approved for NASAL specimens only), is one component of a comprehensive MRSA colonization surveillance program. It is not intended to diagnose MRSA infection nor to guide or monitor treatment for MRSA infections. Performed at [facility], [place] [address] [place], [place] [address]      Radiology Studies: Ct Abdomen Pelvis Wo Contrast  Result Date: 12/23/2018 CLINICAL DATA:  Pt BIB EMS from [facility] c/o small bowel obstruction. EMS reports pt had xray at facility. Facility recommended pt be brought here. Hx alcoholic cirrhosis, UTI. EXAM: CT ABDOMEN AND PELVIS WITHOUT CONTRAST TECHNIQUE: Multidetector CT imaging of the abdomen and pelvis was performed following the standard protocol without IV contrast. COMPARISON:  CT, 11/09/2018 FINDINGS: Lower chest: Heart is normal in size. There is mild dependent lung base opacity, right greater than left, consistent with atelectasis. Hepatobiliary: Liver is small and nodule reflecting advanced cirrhosis. No liver mass or focal lesion. Dense material is noted within the gallbladder consistent with stones and sludge, stable from the prior study. No bile duct dilation. Pancreas: Unremarkable. No pancreatic ductal dilatation or surrounding inflammatory changes. Spleen:  Normal in size without focal abnormality. Adrenals/Urinary Tract: No adrenal masses. Bilobed 2.5 cm exophytic mass from the upper pole the right kidney consistent with a cyst, stable. Bilateral renal cortical thinning. No other masses, no stones and no hydronephrosis. Normal ureters. Normal bladder. Stomach/Bowel: Stomach is unremarkable. Small bowel and colon are normal in caliber. No bowel wall thickening or inflammation. Normal appendix suggested but not definitively seen. No evidence of appendicitis. Vascular/Lymphatic: Aortic atherosclerosis. No enlarged abdominal or pelvic lymph nodes. Reproductive: Uterus and bilateral adnexa are unremarkable. Other: Large amount of ascites distends the abdomen. No abdominal wall hernia. Musculoskeletal: No fracture or acute finding. No osteoblastic or osteolytic lesions. IMPRESSION: 1. No acute findings.  No bowel obstruction or bowel inflammation. 2. Large amount of ascites, similar to the prior CT. 3. Advanced cirrhosis. 4. Aortic atherosclerosis. Electronically Signed   By: Lajean Manes M.D.   On: 12/23/2018 19:07    Scheduled Meds: . lactulose  30 g Oral TID  . midodrine  10 mg Oral TID WC  . rifaximin  550 mg Oral BID   Continuous Infusions: . sodium chloride Stopped (12/24/18 1039)  . sodium chloride    . ceFEPime (MAXIPIME) IV Stopped (12/24/18 1916)  . metronidazole Stopped (12/25/18 9323)     LOS: 2 days   Marylu Lund, MD [facility] Pager On Amion  If 7PM-7AM, please contact night-coverage 12/25/2018, 1:47 PM

## 2018-12-25 NOTE — Care Management Note (Signed)
Case Management Note  Patient Details  Name: [name] MRN: [id number] Date of Birth: 1956-10-29  Subjective/Objective:                  Return to top of Liver Disease Complications RRG - [place]  Discharge readiness is indicated by patient meeting Recovery Milestones, including ALL of the following: ? Hemodynamic stability  Bp=85/54 ? Tachypnea absent heart rate 153 ? Hypoxemia absent room air ? No peritonitis, or treatment adequate   ? aqscites oin ct scan of abd. ? Ascites absent or adequately controlled/ large amounts of ascites ? No bleeding hgb-8.9 ? Volume status adequately controlled yes ? Renal function at baseline or acceptable for next level of care  ? Bun=50/creatinine=4.51 ? Electrolyte levels adequate for outpatient management ? Na=134 ? Mental status at baseline no ? Ambulatory[P] no Liver cirrhosis secondary alcohol abuse -Chronic hypotension based on systolic 90 -Paroxysmal atrial fibrillation ?    Action/Plan: Will follow for progression of care and clinical status. Will follow for case management needs none present at this time.  Expected Discharge Date:                  Expected Discharge Plan:     In-House Referral:     Discharge planning Services     Post Acute Care Choice:    Choice offered to:     DME Arranged:    DME Agency:     HH Arranged:    HH Agency:     Status of Service:     If discussed at [employer], dates discussed:    Additional Comments:  Leeroy Cha, RN 12/25/2018, 8:45 AM

## 2018-12-26 DIAGNOSIS — Z7189 Other specified counseling: Secondary | ICD-10-CM

## 2018-12-26 DIAGNOSIS — I480 Paroxysmal atrial fibrillation: Secondary | ICD-10-CM

## 2018-12-26 DIAGNOSIS — R52 Pain, unspecified: Secondary | ICD-10-CM

## 2018-12-26 MED ORDER — HYDROMORPHONE HCL 1 MG/ML IJ SOLN
0.5000 mg | INTRAMUSCULAR | Status: DC | PRN
  Administered 2018-12-26 – 2018-12-27 (×5): 0.5 mg via INTRAVENOUS
  Filled 2018-12-26 (×5): qty 1

## 2018-12-26 MED ORDER — SODIUM CHLORIDE 0.9 % IV SOLN
2.0000 g | INTRAVENOUS | Status: DC
  Administered 2018-12-26: 2 g via INTRAVENOUS
  Filled 2018-12-26 (×2): qty 20

## 2018-12-26 NOTE — Progress Notes (Signed)
Pharmacy Antibiotic Note  [name] is a 63 y.o. female admitted on 12/23/2018 with sepsis, intra-abdominal infection .  Pharmacy has been consulted for Cefepime dosing.  Today is day # 3 broad spectrum antibiotics. Most recent SCr 4.5 on 12/24/18 WBC 15.2  Plan: Continue Cefepime 1g IV q24hr Metronidazole 500 mg IV q8h per MD. Follow up plans for End of Life and Palliative Care.  Height: 5\' 4"  (162.6 cm) Weight: 156 lb 15.5 oz (71.2 kg) IBW/kg (Calculated) : 54.7  Temp (24hrs), Avg:97.8 F (36.6 C), Min:97.8 F (36.6 C), Max:97.8 F (36.6 C)  Recent Labs  Lab 12/23/18 1609 12/23/18 1802 12/23/18 2015 12/24/18 0649  WBC 15.1*  --   --  15.2*  CREATININE 4.91*  --   --  4.51*  LATICACIDVEN  --  1.42 1.08  --     Estimated Creatinine Clearance: 12.4 mL/min (A) (by C-G formula based on SCr of 4.51 mg/dL (H)).    No Known Allergies  Antimicrobials this admission: 1/4 Cefepime >> 1/4 Flagyl >>   Dose adjustments this admission:   Microbiology results: 1/4 BCx: ngtd 1/5 MRSA PCR: neg  Thank you for allowing pharmacy to be a part of this patient's care.  Gretta Arab PharmD, BCPS Pager [phone] 12/26/2018 2:02 PM

## 2018-12-26 NOTE — Progress Notes (Signed)
PROGRESS NOTE    [name]  HDQ:[id number] DOB: 25-Jan-1956 DOA: 12/23/2018 PCP: System, Pcp Not In    Brief Narrative:  63yo with ETOH cirrhosis presented with encephalopathy, found to be hypotensive requiring pressor support. Patient noted to have concerns of multi-organ system failure and conerns of hepatorenal syndrome. Patient wishes noted to be DNR with change to focus on comfort care. Pt transferred to hospitalist service 1/6  Assessment & Plan:   Principal Problem:   Alcoholic liver disease ([place]) Active Problems:   PAF (paroxysmal atrial fibrillation) ([place])   Renal failure   Hypotension   Hepatic encephalopathy ([place])   1. Hypotension 1. Presented pressor dependent 2. CCM discussed case with family who desired more focus on comfort, as such pressors were discontinued with transition to focus on more conservative measures such as empiric abx and lactulose 3. Stable overnight. Stable 2. Alcoholic liver disease 1. Most recent bili of 1.0 with ast 48 and alt of 25 2. Ammonia of 18 3. Avoiding hepatotoxic agents 4. Continued on lactulose and xifaxan as tolerated 3. ARF 1. Presenting Cr of 4.91, was 3.08 pon 12/21 2. Concerns of hepatorenal syndrome 4. Afib 1. Last noted to be rate controlled 5. Hepatic encephalopathy 1. Currently on lactulose and xifaxan 6. End of Life 1. Palliative Care consulted 2. Family had requested change to focus on comfort care 3. Plan to continue as planned and monitor over the next 24hrs   DVT prophylaxis: comfort measures Code Status: DNR Family Communication: Pt in room Disposition Plan: Uncertain at this time  Consultants:   Palliative Care  Critical Care  Procedures:     Antimicrobials: Anti-infectives (From admission, onward)   Start     Dose/Rate Route Frequency Ordered Stop   12/24/18 1800  ceFEPIme (MAXIPIME) 1 g in sodium chloride 0.9 % 100 mL IVPB     1 g 200 mL/hr over 30 Minutes Intravenous Every 24 hours  12/23/18 2200     12/23/18 2200  rifaximin (XIFAXAN) tablet 550 mg     550 mg Oral 2 times daily 12/23/18 2150     12/23/18 2145  metroNIDAZOLE (FLAGYL) IVPB 500 mg  Status:  Discontinued     500 mg 100 mL/hr over 60 Minutes Intravenous Every 8 hours 12/23/18 2141 12/23/18 2146   12/23/18 1745  ceFEPIme (MAXIPIME) 2 g in sodium chloride 0.9 % 100 mL IVPB     2 g 200 mL/hr over 30 Minutes Intravenous  Once 12/23/18 1743 12/23/18 1857   12/23/18 1745  metroNIDAZOLE (FLAGYL) IVPB 500 mg     500 mg 100 mL/hr over 60 Minutes Intravenous Every 8 hours 12/23/18 1743        Subjective: Without complaints. Still confused  Objective: Vitals:   12/26/18 0300 12/26/18 0400 12/26/18 0431 12/26/18 0500  BP:      Pulse:      Resp: (!) 9 (!) 9  (!) 9  Temp:      TempSrc:      SpO2:      Weight:   71.2 kg   Height:        Intake/Output Summary (Last 24 hours) at 12/26/2018 1320 Last data filed at 12/26/2018 0400 Gross per 24 hour  Intake 657.25 ml  Output -  Net 657.25 ml   Filed Weights   12/24/18 0314 12/25/18 0420 12/26/18 0431  Weight: 68.5 kg 69.2 kg 71.2 kg    Examination: General exam: Awake, laying in bed, in nad Respiratory system: Normal respiratory  effort, no wheezing  Data Reviewed: I have personally reviewed following labs and imaging studies  CBC: Recent Labs  Lab 12/23/18 1609 12/24/18 0649  WBC 15.1* 15.2*  NEUTROABS 11.6*  --   HGB 9.4* 8.9*  HCT 29.5* 28.6*  MCV 99.3 100.7*  PLT 285 865   Basic Metabolic Panel: Recent Labs  Lab 12/23/18 1609 12/24/18 0649  NA 134* 134*  K 4.6 3.6  CL 106 106  CO2 16* 15*  GLUCOSE 104* 123*  BUN 54* 50*  CREATININE 4.91* 4.51*  CALCIUM 8.6* 7.9*   GFR: Estimated Creatinine Clearance: 12.4 mL/min (A) (by C-G formula based on SCr of 4.51 mg/dL (H)). Liver Function Tests: Recent Labs  Lab 12/23/18 1609  AST 48*  ALT 25  ALKPHOS 107  BILITOT 1.0  PROT 5.8*  ALBUMIN 2.4*   Recent Labs  Lab 12/23/18 1609   LIPASE 36   Recent Labs  Lab 12/23/18 1610  AMMONIA 18   Coagulation Profile: Recent Labs  Lab 12/23/18 1609  INR 1.00   Cardiac Enzymes: No results for input(s): CKTOTAL, CKMB, CKMBINDEX, TROPONINI in the last 168 hours. BNP (last 3 results) No results for input(s): PROBNP in the last 8760 hours. HbA1C: No results for input(s): HGBA1C in the last 72 hours. CBG: No results for input(s): GLUCAP in the last 168 hours. Lipid Profile: No results for input(s): CHOL, HDL, LDLCALC, TRIG, CHOLHDL, LDLDIRECT in the last 72 hours. Thyroid Function Tests: No results for input(s): TSH, T4TOTAL, FREET4, T3FREE, THYROIDAB in the last 72 hours. Anemia Panel: No results for input(s): VITAMINB12, FOLATE, FERRITIN, TIBC, IRON, RETICCTPCT in the last 72 hours. Sepsis Labs: Recent Labs  Lab 12/23/18 1802 12/23/18 2015  LATICACIDVEN 1.42 1.08    Recent Results (from the past 240 hour(s))  Blood culture (routine x 2)     Status: None (Preliminary result)   Collection Time: 12/23/18  6:08 PM  Result Value Ref Range Status   Specimen Description BLOOD LEFT ARM  Final   Special Requests   Final    BOTTLES DRAWN AEROBIC AND ANAEROBIC Blood Culture adequate volume Performed at [facility] [address] [place], [place] [address]    Culture   Final    NO GROWTH 3 DAYS Performed at [facility], [place] [address], [place], [place] [address]    Report Status PENDING  Incomplete  Blood culture (routine x 2)     Status: None (Preliminary result)   Collection Time: 12/23/18  6:16 PM  Result Value Ref Range Status   Specimen Description BLOOD RIGHT WRIST  Final   Special Requests   Final    BOTTLES DRAWN AEROBIC AND ANAEROBIC Blood Culture results may not be optimal due to an inadequate volume of blood received in culture bottles Performed at [facility], [place] [address], [place], [place] [address]    Culture   Final    NO GROWTH 3  DAYS Performed at [facility], [place] [address] [place], [place] [address]    Report Status PENDING  Incomplete  MRSA PCR Screening     Status: None   Collection Time: 12/24/18  3:04 AM  Result Value Ref Range Status   MRSA by PCR NEGATIVE NEGATIVE Final    Comment:        The GeneXpert MRSA Assay (FDA approved for NASAL specimens only), is one component of a comprehensive MRSA colonization surveillance program. It is not intended to diagnose MRSA infection  nor to guide or monitor treatment for MRSA infections. Performed at [facility], [place] [address] [place], [place] [address]      Radiology Studies: No results found.  Scheduled Meds: . lactulose  30 g Oral TID  . midodrine  10 mg Oral TID WC  . rifaximin  550 mg Oral BID   Continuous Infusions: . sodium chloride Stopped (12/24/18 1039)  . sodium chloride    . ceFEPime (MAXIPIME) IV Stopped (12/25/18 1739)  . metronidazole Stopped (12/26/18 1019)     LOS: 3 days   Marylu Lund, MD [facility] Pager On Amion  If 7PM-7AM, please contact night-coverage 12/26/2018, 1:20 PM

## 2018-12-26 NOTE — Progress Notes (Addendum)
Daily Progress Note   Patient Name: [name]       Date: 12/26/2018 DOB: Nov 29, 1956  Age: 63 y.o. MRN#: [id number] Attending Physician: Donne Hazel, MD Primary Care Physician: System, Pcp Not In Admit Date: 12/23/2018  Reason for Consultation/Follow-up: Establishing goals of care  Subjective:  patient is awake, she is restless.  She is able to say yes/no.  States that she has to go to the bathroom.  Call placed and discussed with HCPOA brother Rodman Pickle, see below. Chart reviewed, the patient has required total 12 mg (2 mg IV each times 6 doses) of Morphine in the last 24 hours.   Length of Stay: 3  Current Medications: Scheduled Meds:  . lactulose  30 g Oral TID  . midodrine  10 mg Oral TID WC  . rifaximin  550 mg Oral BID    Continuous Infusions: . sodium chloride Stopped (12/24/18 1039)  . sodium chloride    . cefTRIAXone (ROCEPHIN)  IV    . metronidazole Stopped (12/26/18 0921)    PRN Meds: HYDROmorphone (DILAUDID) injection  Physical Exam         Frail weak Regular work of breathing S 1 S 2  Has some bruises on both UE and LE Abdomen slightly distended  Vital Signs: BP 95/68   Pulse (!) 122   Temp 97.8 F (36.6 C) (Oral)   Resp (!) 8   Ht 5\' 4"  (1.626 m)   Wt 71.2 kg   SpO2 99%   BMI 26.94 kg/m  SpO2: SpO2: 99 % O2 Device: O2 Device: Room Air O2 Flow Rate:    Intake/output summary:   Intake/Output Summary (Last 24 hours) at 12/26/2018 1521 Last data filed at 12/26/2018 1200 Gross per 24 hour  Intake 537.25 ml  Output -  Net 537.25 ml   LBM: Last BM Date: 12/24/18 Baseline Weight: Weight: 63.5 kg Most recent weight: Weight: 71.2 kg       Palliative Assessment/Data:    Flowsheet Rows     Most Recent Value  Intake Tab  Referral  Department  Critical care  Unit at Time of Referral  ICU  Date Notified  12/24/18  Palliative Care Type  Return patient Palliative Care  Reason for referral  Clarify Goals of Care  Date of Admission  12/23/18  #  of days IP prior to Palliative referral  1  Clinical Assessment  Psychosocial & Spiritual Assessment  Palliative Care Outcomes      Patient Active Problem List   Diagnosis Date Noted  . AKI (acute kidney injury) ([place])   . Septic shock ([place])   . Hepatic encephalopathy ([place])   . Reflux esophagitis   . Stomach irritation   . Postpyloric ulcer   . Coffee ground emesis   . Hypotension   . Palliative care encounter   . DNR (do not resuscitate)   . Pressure injury of skin 12/06/2018  . UTI (urinary tract infection) 12/05/2018  . Acute on chronic kidney failure ([place]) 12/05/2018  . Alcoholic liver disease ([place]) 12/05/2018  . GERD (gastroesophageal reflux disease) 12/05/2018  . Encephalopathy chronic 11/26/2018  . Weakness generalized   . Encephalopathy   . Palliative care by specialist   . Ascites 11/11/2018  . Renal failure 11/09/2018  . Dysthymia 10/26/2018  . Sepsis ([place]) 10/25/2018  . ETOH abuse 10/25/2018  . PAF (paroxysmal atrial fibrillation) ([place]) 11/26/2017  . Malnutrition of moderate degree 09/27/2016  . Acute pancreatitis 09/26/2016    Palliative Care Assessment & Plan   Patient Profile:    Assessment:  62yo with ETOH cirrhosis presented with encephalopathy, found to be hypotensive requiring pressor support. Patient noted to have concerns of multi-organ system failure and conerns of hepatorenal syndrome.  Alcoholic liver disease.  Low blood pressure A fib Generalized pain Generalized deconditioning.   Recommendations/Plan:   Agree with DNR DNI  Continue current mode of care  Serum creatinine remains elevated, will opioid rotate to IV Dilaudid PRN and monitor.   Ok to use pure wick or foley for comfort purposes.   Call placed and discussed  with HCPOA brother Leverne Humbles [phone]. He is driving into town. A family meeting for further goals of care discussions has been scheduled for 12.30 PM on 12-27-2018.   Code Status:    Code Status Orders  (From admission, onward)         Start     Ordered   12/24/18 1054  Do not attempt resuscitation (DNR)  Continuous    Question Answer Comment  In the event of cardiac or respiratory ARREST Do not call a "code blue"   In the event of cardiac or respiratory ARREST Do not perform Intubation, CPR, defibrillation or ACLS   In the event of cardiac or respiratory ARREST Use medication by any route, position, wound care, and other measures to relive pain and suffering. May use oxygen, suction and manual treatment of airway obstruction as needed for comfort.      12/24/18 1053        Code Status History    Date Active Date Inactive Code Status Order ID Comments User Context   12/23/2018 2137 12/24/2018 1053 Full Code [id number]  Aldean Jewett, MD ED   12/23/2018 2014 12/23/2018 2137 Full Code [id number]  Dorie Rank, MD ED   12/23/2018 1907 12/23/2018 2014 DNR [id number]  Dorie Rank, MD ED   12/07/2018 1223 12/09/2018 1610 DNR [id number]  Fuller Canada, PA-C Inpatient   12/06/2018 0001 12/07/2018 1223 Full Code [id number]  Lance Coon, MD Inpatient   11/26/2018 2121 11/29/2018 1944 Full Code [id number]  Salary, Avel Peace, MD ED   11/09/2018 1951 11/20/2018 1804 Full Code [id number]  [name], MD Inpatient   10/25/2018 0251 10/29/2018 2045 Full Code [id number]  Lance Coon, MD Inpatient   05/19/2018 1958 05/21/2018 1712  Full Code [id number]  Fritzi Mandes, MD Inpatient   11/27/2017 0109 11/29/2017 1844 Full Code [id number]  Gorden Harms, MD Inpatient   09/26/2016 1313 09/27/2016 1802 Full Code [id number]  Dustin Flock, MD ED    Advance Directive Documentation     Most Recent Value  Type of Advance Directive  Living will  Pre-existing out of facility DNR order (yellow form or pink MOST  form)  -  "MOST" Form in Place?  -       Prognosis:   guarded   Discharge Planning:  To Be Determined  Care plan was discussed with  Patient, RN, brother Leverne Humbles over the phone.   Thank you for allowing the Palliative Medicine Team to assist in the care of this patient.   Time In: 1400 Time Out: 1435 Total Time 35 Prolonged Time Billed  no       Greater than 50%  of this time was spent counseling and coordinating care related to the above assessment and plan.  Loistine Chance, MD [phone] Please contact Palliative Medicine Team phone at [phone] for questions and concerns.

## 2018-12-26 NOTE — Consult Note (Signed)
Consultation Note Date: 12/26/2018   Patient Name: [name]  DOB: June 01, 1956  MRN: [id number]  Age / Sex: 63 y.o., female  PCP: System, Pcp Not In Referring Physician: Donne Hazel, MD  Reason for Consultation: Establishing goals of care and Psychosocial/spiritual support  HPI/Patient Profile: 63 y.o. female  with past medical history of alcoholic cirrhosis, atrial fibrillation, CKD (baseline 1.5) and depression who was admitted on 12/23/2018 with multiorgan failure from hypovolemic/septic shock.  She has had frequent admissions and was seen by PMT in December.  Since discharge, she and her husband (married earlier this year) have been sharing room at [facility].  PCCM discussed with husband yesterday and overall goal per him moving forward is her comfort.  She remains on abx and medication for hepatic encephalopathy.  Palliative consulted for support and further conversation regarding care plan moving forward.  Clinical Assessment and Goals of Care:  I have reviewed medical records including EPIC notes, labs and imaging, received report from her bedside RN.  During last encounter, it was noted that APS has concluded that Dub Mikes is competent to make medical decisions for his wife Idabell.    I met with Annet.  She is awake and alert in bed, but confused and cannot participate in decisionmaking regarding her care moving forward.  I called and spoke with Dub Mikes.  He and Chanda were married August of 2019.  They have been sharing a room at [name] since they were both discharged from the hospital last month.  We discussed her current illness and his understanding of the situation.  He reports being updated by Dr. Valeta Harms that she is not doing well, has multiorgan involvement, and plan was made to focus on comfort.  I discussed with him regarding current care plan, including continuation of  antibiotics and medications such as rifaxamin and lactulose.  We discussed if these ought to be continued or only medications for her comfort.  He reports that he feels that only medications for her comfort ought to be given and stop abx and lactulose.  He then asked me to speak to her brother, Marlou Sa, who had come to SNF to meet him.  It appears this is the first time Marlou Sa has met Joaquim Lai.  Marlou Sa reports coming to the hospital earlier today and seeing Orabelle.  I reviewed her clinical course and options moving forward with Marlou Sa per Joaquim Lai request that I do so.  Marlou Sa reports that he is in agreement with plan only for comfort and would be agreeable to discontinuation of other medications.  He then told me that Demica had previously named their other brother, Richardson Landry, as her HCPOA.  Family was unsure if this changed is she married.  She has not completed other advance directives that they are aware of.  Marlou Sa reports that he thinks that Richardson Landry would be in agreement with plan only for comfort, but he does not know if he would want to come to visit to assess her or see her before stopping abx and meds  for hepatic encephalopathy.  He asked me to call Richardson Landry to discuss.  I called and left message for Richardson Landry asking for return call [phone]).  Questions and concerns were addressed.  The family was encouraged to call with questions or concerns.   Primary Decision Maker:  Husband?  Possibly named brother Richardson Landry [phone]) as HCPOA?    SUMMARY OF RECOMMENDATIONS   - Unclear if she had named her brother Richardson Landry as HCPOA.  I left voicemail asking for return call.  Continue current care plan until determine if Richardson Landry is in fact legal decision  maker. Overall goal is comfort, but will not d/c abx or medications for hepatic failure until discuss with Richardson Landry (if he returns call).  Code Status/Advance Care Planning:  DNR   Symptom Management:   Pain: Treat pain regardless of concern of side effects.   Prognosis:     Guarded  Discharge Planning: ? Residential hospice?      Primary Diagnoses: Present on Admission: . Hypotension . Renal failure . PAF (paroxysmal atrial fibrillation) ([place]) . Hepatic encephalopathy ([place]) . Alcoholic liver disease ([place])   I have reviewed the medical record, interviewed the patient and family, and examined the patient. The following aspects are pertinent.  Past Medical History:  Diagnosis Date  . Alcoholic liver disease ([place])   . Cancer ([place])    skin  . Depression   . ETOH abuse   . GERD (gastroesophageal reflux disease)   . PAF (paroxysmal atrial fibrillation) ([facility])    Social History   Socioeconomic History  . Marital status: Married    Spouse name: Not on file  . Number of children: Not on file  . Years of education: Not on file  . Highest education level: Not on file  Occupational History  . Not on file  Social Needs  . Financial resource strain: Not on file  . Food insecurity:    Worry: Not on file    Inability: Not on file  . Transportation needs:    Medical: Not on file    Non-medical: Not on file  Tobacco Use  . Smoking status: Current Every Day Smoker    Packs/day: 0.25    Types: Cigarettes  . Smokeless tobacco: Never Used  Substance and Sexual Activity  . Alcohol use: Yes    Alcohol/week: 1.0 standard drinks    Types: 1 Shots of liquor per week  . Drug use: No  . Sexual activity: Never  Lifestyle  . Physical activity:    Days per week: Not on file    Minutes per session: Not on file  . Stress: Not on file  Relationships  . Social connections:    Talks on phone: Not on file    Gets together: Not on file    Attends religious service: Not on file    Active member of club or organization: Not on file    Attends meetings of clubs or organizations: Not on file    Relationship status: Not on file  Other Topics Concern  . Not on file  Social History Narrative  . Not on file   Family History  Problem Relation Age of Onset   . Heart disease Mother   . Cancer Father   . Breast cancer Neg Hx    Scheduled Meds: . lactulose  30 g Oral TID  . midodrine  10 mg Oral TID WC  . rifaximin  550 mg Oral BID   Continuous Infusions: . sodium chloride Stopped (12/24/18 1039)  .  sodium chloride    . ceFEPime (MAXIPIME) IV Stopped (12/25/18 1739)  . metronidazole 500 mg (12/26/18 0919)   PRN Meds:.morphine injection No Known Allergies Review of Systems complains of abdominal pain, poor appetite.  Husband complains she is frequently confused.  Physical Exam  Pale frail female who appears much older than stated age.  Awake, alert, but confused. CV difficult to hear but no m/r/g resp no distress, no w/c/r   Vital Signs: BP 95/68   Pulse (!) 122   Temp 97.8 F (36.6 C) (Oral)   Resp (!) 9   Ht '5\' 4"'  (1.626 m)   Wt 71.2 kg   SpO2 99%   BMI 26.94 kg/m  Pain Scale: PAINAD   Pain Score: Asleep   SpO2: SpO2: 99 % O2 Device:SpO2: 99 % O2 Flow Rate: .   IO: Intake/output summary:   Intake/Output Summary (Last 24 hours) at 12/26/2018 0940 Last data filed at 12/26/2018 0400 Gross per 24 hour  Intake 870.55 ml  Output -  Net 870.55 ml    LBM: Last BM Date: 12/24/18 Baseline Weight: Weight: 63.5 kg Most recent weight: Weight: 71.2 kg     Palliative Assessment/Data: 30%   Flowsheet Rows     Most Recent Value  Intake Tab  Referral Department  Critical care  Unit at Time of Referral  ICU  Date Notified  12/24/18  Palliative Care Type  Return patient Palliative Care  Reason for referral  Clarify Goals of Care  Date of Admission  12/23/18  # of days IP prior to Palliative referral  1  Clinical Assessment  Psychosocial & Spiritual Assessment  Palliative Care Outcomes      Time Total: 70 min. Greater than 50%  of this time was spent counseling and coordinating care related to the above assessment and plan. Micheline Rough, MD [place] Team [phone]  Please contact  Palliative Medicine Team phone at [phone] for questions and concerns.  For individual provider: See Shea Evans

## 2018-12-27 MED ORDER — SODIUM CHLORIDE 0.9% FLUSH
3.0000 mL | Freq: Two times a day (BID) | INTRAVENOUS | Status: DC
  Administered 2018-12-28: 3 mL via INTRAVENOUS

## 2018-12-27 MED ORDER — HALOPERIDOL LACTATE 5 MG/ML IJ SOLN: 0.5000 mg | INTRAMUSCULAR | Status: DC | PRN

## 2018-12-27 MED ORDER — HALOPERIDOL LACTATE 2 MG/ML PO CONC: 0.5000 mg | ORAL | 0 refills | Status: AC | PRN

## 2018-12-27 MED ORDER — HALOPERIDOL LACTATE 2 MG/ML PO CONC
0.5000 mg | ORAL | Status: DC | PRN
  Filled 2018-12-27: qty 0.3

## 2018-12-27 MED ORDER — SODIUM CHLORIDE 0.9% FLUSH: 3.0000 mL | INTRAVENOUS | Status: DC | PRN

## 2018-12-27 MED ORDER — SODIUM CHLORIDE 0.9 % IV SOLN
250.0000 mL | INTRAVENOUS | Status: DC | PRN
  Administered 2018-12-27 (×2): 250 mL via INTRAVENOUS

## 2018-12-27 MED ORDER — GLYCOPYRROLATE 1 MG PO TABS: 1.0000 mg | ORAL_TABLET | ORAL | 0 refills | Status: AC | PRN

## 2018-12-27 MED ORDER — GLYCOPYRROLATE 0.2 MG/ML IJ SOLN: 0.2000 mg | INTRAMUSCULAR | Status: DC | PRN

## 2018-12-27 MED ORDER — LORAZEPAM 0.5 MG PO TABS: 0.5000 mg | ORAL_TABLET | Freq: Two times a day (BID) | ORAL | 0 refills | Status: AC

## 2018-12-27 MED ORDER — LORAZEPAM 2 MG/ML IJ SOLN: 0.5000 mg | Freq: Four times a day (QID) | INTRAMUSCULAR | Status: DC | PRN

## 2018-12-27 MED ORDER — LORAZEPAM 0.5 MG PO TABS
0.5000 mg | ORAL_TABLET | Freq: Two times a day (BID) | ORAL | Status: DC
  Administered 2018-12-27 – 2018-12-28 (×3): 0.5 mg via SUBLINGUAL
  Filled 2018-12-27 (×4): qty 1

## 2018-12-27 MED ORDER — HALOPERIDOL 0.5 MG PO TABS: 0.5000 mg | ORAL_TABLET | ORAL | Status: DC | PRN

## 2018-12-27 MED ORDER — GLYCOPYRROLATE 1 MG PO TABS: 1.0000 mg | ORAL_TABLET | ORAL | Status: DC | PRN

## 2018-12-27 MED ORDER — BIOTENE DRY MOUTH MT LIQD: 15.0000 mL | OROMUCOSAL | Status: DC | PRN

## 2018-12-27 MED ORDER — FUROSEMIDE 10 MG/ML IJ SOLN: 40.0000 mg | Freq: Every day | INTRAMUSCULAR | Status: DC

## 2018-12-27 MED ORDER — POLYVINYL ALCOHOL 1.4 % OP SOLN
1.0000 [drp] | Freq: Four times a day (QID) | OPHTHALMIC | Status: DC | PRN
  Filled 2018-12-27: qty 15

## 2018-12-27 MED ORDER — HYDROMORPHONE HCL 1 MG/ML IJ SOLN
0.5000 mg | INTRAMUSCULAR | Status: DC | PRN
  Administered 2018-12-27: 0.5 mg via INTRAVENOUS
  Administered 2018-12-27 – 2018-12-28 (×3): 1 mg via INTRAVENOUS
  Filled 2018-12-27 (×4): qty 1

## 2018-12-27 MED ORDER — GLYCOPYRROLATE 0.2 MG/ML IJ SOLN
0.2000 mg | INTRAMUSCULAR | Status: DC | PRN
  Administered 2018-12-27: 0.2 mg via INTRAVENOUS
  Filled 2018-12-27: qty 1

## 2018-12-27 MED ORDER — HYDROMORPHONE HCL 1 MG/ML IJ SOLN
0.2500 mg | Freq: Three times a day (TID) | INTRAMUSCULAR | Status: DC
  Administered 2018-12-27 – 2018-12-28 (×3): 0.25 mg via INTRAVENOUS
  Filled 2018-12-27 (×3): qty 1

## 2018-12-27 NOTE — Progress Notes (Addendum)
Daily Progress Note   Patient Name: [name]       Date: 12/27/2018 DOB: August 29, 1956  Age: 63 y.o. MRN#: [id number] Attending Physician: Kayleen Memos, DO Primary Care Physician: System, Pcp Not In Admit Date: 12/23/2018  Reason for Consultation/Follow-up: Establishing goals of care and Psychosocial/spiritual support  Subjective: Patient is lethargic but complains of abdominal pain.  I met with her husband [name]) and bother [name], [place]) in a conference room.  Both are in agreement with shifting to full comfort care and going to [facility] - preferably in [place] as her brother is staying in [place].     We discussed discontinuing antibiotics as well as lactulose and rifaximin.  Family understands that she will become very sleepy and simply go to sleep.  Her husband [name] is currently in PT rehab at [facility].  He would like to spend as much time with her as possible in her final days.    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  Admitted with multi organ system failure.  Currently hypotensive and lethargic.   Patient Profile/HPI:  63 y.o. female  with past medical history of alcoholic cirrhosis, atrial fibrillation, CKD (baseline 1.5) and depression who was admitted on 12/23/2018 with multiorgan failure from hypovolemic/septic shock.  She has had frequent admissions and was seen by PMT in December.  Since discharge, she and her husband (married earlier this year) have been sharing room at [facility].  PCCM discussed with husband yesterday and overall goal per him moving forward is her comfort.  She remains on abx and medication for hepatic encephalopathy.  Palliative consulted for support and further conversation  regarding care plan moving forward.     Length of Stay: 4  Current Medications: Scheduled Meds:  .  HYDROmorphone (DILAUDID) injection  0.25 mg Intravenous Q8H  . LORazepam  0.5 mg Sublingual BID  . sodium chloride flush  3 mL Intravenous Q12H    Continuous Infusions: . sodium chloride      PRN Meds: sodium chloride, antiseptic oral rinse, glycopyrrolate **OR** glycopyrrolate **OR** glycopyrrolate, haloperidol **OR** haloperidol **OR** haloperidol lactate, HYDROmorphone (DILAUDID) injection, polyvinyl alcohol, sodium chloride flush  Physical Exam        Chronically ill appearing lethargic female, NAD cv tachy resp no distress Abdomen  tight ascites.  Vital Signs: BP (!) 85/68 (BP Location: Right Arm) Comment: notified RN  Pulse (!) 106   Temp 97.6 F (36.4 C) (Oral)   Resp 14   Ht _0  (1.626 m)   Wt 71.2 kg   SpO2 100%   BMI 26.94 kg/m  SpO2: SpO2: 100 % O2 Device: O2 Device: Room Air O2 Flow Rate:    Intake/output summary:   Intake/Output Summary (Last 24 hours) at 12/27/2018 1302 Last data filed at 12/27/2018 1200 Gross per 24 hour  Intake 389.29 ml  Output 325 ml  Net 64.29 ml   LBM: Last BM Date: (PTA, unknown) Baseline Weight: Weight: 63.5 kg Most recent weight: Weight: 71.2 kg       Palliative Assessment/Data:  20%    Flowsheet Rows     Most Recent Value  Intake Tab  Referral Department  Critical care  Unit at Time of Referral  ICU  Date Notified  12/24/18  Palliative Care Type  Return patient Palliative Care  Reason for referral  Clarify Goals of Care  Date of Admission  12/23/18  # of days IP prior to Palliative referral  1  Clinical Assessment  Psychosocial & Spiritual Assessment  Palliative Care Outcomes      Patient Active Problem List   Diagnosis Date Noted  . Goals of care, counseling/discussion   . Generalized pain   . AKI (acute kidney injury) ([place])   . Septic shock ([place])   . Hepatic encephalopathy ([place])   . Reflux  esophagitis   . Stomach irritation   . Postpyloric ulcer   . Coffee ground emesis   . Hypotension   . Palliative care encounter   . DNR (do not resuscitate)   . Pressure injury of skin 12/06/2018  . UTI (urinary tract infection) 12/05/2018  . Acute on chronic kidney failure ([place]) 12/05/2018  . Alcoholic liver disease ([place]) 12/05/2018  . GERD (gastroesophageal reflux disease) 12/05/2018  . Encephalopathy chronic 11/26/2018  . Weakness generalized   . Encephalopathy   . Palliative care by specialist   . Ascites 11/11/2018  . Renal failure 11/09/2018  . Dysthymia 10/26/2018  . Sepsis ([place]) 10/25/2018  . ETOH abuse 10/25/2018  . PAF (paroxysmal atrial fibrillation) ([place]) 11/26/2017  . Malnutrition of moderate degree 09/27/2016  . Acute pancreatitis 09/26/2016    Palliative Care Plan    Recommendations/Plan:  Shift to full comfort.  Discontinuing medications not specifically geared towards comfort.  Needs paracentesis but I don't know if her blood pressure will allow for it.  Complaining of pain.  I will add very low dose scheduled pain medication in addition to PRN.  Transfer to [facility] when bed is available.  Husband is in a wheel chair from [facility]. He would like to find a way to be with her as much as possible.   Goals of Care and Additional Recommendations:  Limitations on Scope of Treatment: Full Comfort Care  Code Status:  DNR  Prognosis: Days to possibly a week.  Discharge Planning:  Hospice facility  Care plan was discussed with husband and HCPOA [name]  Thank you for allowing the Palliative Medicine Team to assist in the care of this patient.  Total time spent:  35 min.     Greater than 50%  of this time was spent counseling and coordinating care related to the above assessment and plan.  Florentina Jenny, PA-C Palliative Medicine  Please contact Palliative MedicineTeam phone at [phone] for questions and concerns  between 7 am  - 7 pm.   Please see AMION for individual provider pager numbers.

## 2018-12-27 NOTE — Discharge Summary (Signed)
Discharge Summary  [name] IRJ:[id number] DOB: [birth year]  PCP: System, Pcp Not In  Admit date: 12/23/2018 Discharge date: 12/27/2018  Time spent: 35 minutes  Recommendations for Outpatient Follow-up:  1. Follow-up with hospice care  Discharge Diagnoses:  Active Hospital Problems   Diagnosis Date Noted  . Alcoholic liver disease ([place]) 12/05/2018  . Goals of care, counseling/discussion   . Generalized pain   . Hepatic encephalopathy ([place])   . Hypotension   . Renal failure 11/09/2018  . PAF (paroxysmal atrial fibrillation) ([place]) 11/26/2017    Resolved Hospital Problems  No resolved problems to display.      Diet recommendation: Pleasure feeding as tolerated  Vitals:   12/26/18 2159 12/27/18 0637  BP: (!) 89/62 (!) 85/68  Pulse: (!) 110 (!) 106  Resp: 12 14  Temp: 97.8 F (36.6 C) 97.6 F (36.4 C)  SpO2: 100% 100%    History of present illness:   62yo with ETOH cirrhosis presented with encephalopathy, found to be hypotensive requiring pressor support. Patient noted to have concerns of multi-organ system failure and conerns of hepatorenal syndrome. Patient wishes noted to be DNR with change to focus on comfort care. Pt transferred to hospitalist service 12/25/18.  12/27/2018: Patient seen and examined at bedside.  No acute events overnight.  Meeting arranged with palliative care team and family today 12/27/2018 at 12:30 PM to determine discharge planning.  Hospital Course:  Principal Problem:   Alcoholic liver disease ([place]) Active Problems:   PAF (paroxysmal atrial fibrillation) (H[facility])   Renal failure   Hypotension   Hepatic encephalopathy ([facility])   Goals of care, counseling/discussion   Generalized pain  1. Hypotension 1. CCM discussed case with family who desired more focus on comfort, as such pressors were discontinued with transition to focus on more conservative measures such as empiric abx and lactulose 2. Alcoholic liver disease 1. Continued on  lactulose and xifaxan as tolerated 3. ARF 1. Concerns of hepatorenal syndrome 4. Afib 1. Last noted to be rate controlled 5. Hepatic encephalopathy 1. Currently on lactulose and xifaxan 6. End of Life 1. Palliative Care consulted and following. 2. Family had requested change to focus on comfort care 3. Meeting arranged with family and palliative care team to determine discharge planning.   DNR/DNI-comfort care   Consultations:  Palliative care team  Discharge Exam: BP (!) 85/68 (BP Location: Right Arm) Comment: notified RN  Pulse (!) 106   Temp 97.6 F (36.4 C) (Oral)   Resp 14   Ht 5\' 4"  (1.626 m)   Wt 71.2 kg   SpO2 100%   BMI 26.94 kg/m  . General: 63 y.o. year-old female well developed well nourished in no acute distress.  Alert and somnolent. . Cardiovascular: Regular rate and rhythm with no rubs or gallops.  No thyromegaly or JVD noted.   Marland Kitchen Respiratory: Rales at bases with no wheezes.  Poor inspiratory effort.  . Abdomen: Distended moderately with hypoactive bowel sounds x4 quadrants. . Musculoskeletal: Emaciated with petechia. Marland Kitchen Psychiatry: Mood is appropriate for condition and setting  Discharge Instructions You were cared for by a hospitalist during your hospital stay. If you have any questions about your discharge medications or the care you received while you were in the hospital after you are discharged, you can call the unit and asked to speak with the hospitalist on call if the hospitalist that took care of you is not available. Once you are discharged, your primary care physician will handle any further medical  issues. Please note that NO REFILLS for any discharge medications will be authorized once you are discharged, as it is imperative that you return to your primary care physician (or establish a relationship with a primary care physician if you do not have one) for your aftercare needs so that they can reassess your need for medications and monitor your lab  values.   Allergies as of 12/27/2018   No Known Allergies     Medication List    STOP taking these medications   cephALEXin 250 MG capsule Commonly known as:  KEFLEX   feeding supplement (ENSURE ENLIVE) Liqd   lactulose 10 GM/15ML solution Commonly known as:  CHRONULAC   metoprolol tartrate 25 MG tablet Commonly known as:  LOPRESSOR   midodrine 5 MG tablet Commonly known as:  PROAMATINE   multivitamin-lutein Caps capsule   pantoprazole 40 MG tablet Commonly known as:  PROTONIX   rifaximin 550 MG Tabs tablet Commonly known as:  XIFAXAN   spironolactone 25 MG tablet Commonly known as:  ALDACTONE   thiamine 100 MG tablet     TAKE these medications   glycopyrrolate 1 MG tablet Commonly known as:  ROBINUL Take 1 tablet (1 mg total) by mouth every 4 (four) hours as needed (excessive secretions).   haloperidol 2 MG/ML solution Commonly known as:  HALDOL Place 0.3 mLs (0.6 mg total) under the tongue every 4 (four) hours as needed for agitation (or delirium).   LORazepam 0.5 MG tablet Commonly known as:  ATIVAN Place 1 tablet (0.5 mg total) under the tongue 2 (two) times daily.      No Known Allergies    The results of significant diagnostics from this hospitalization (including imaging, microbiology, ancillary and laboratory) are listed below for reference.    Significant Diagnostic Studies: Ct Abdomen Pelvis Wo Contrast  Result Date: 12/23/2018 CLINICAL DATA:  Pt BIB EMS from [facility] c/o small bowel obstruction. EMS reports pt had xray at facility. Facility recommended pt be brought here. Hx alcoholic cirrhosis, UTI. EXAM: CT ABDOMEN AND PELVIS WITHOUT CONTRAST TECHNIQUE: Multidetector CT imaging of the abdomen and pelvis was performed following the standard protocol without IV contrast. COMPARISON:  CT, 11/09/2018 FINDINGS: Lower chest: Heart is normal in size. There is mild dependent lung base opacity, right greater than left, consistent with atelectasis.  Hepatobiliary: Liver is small and nodule reflecting advanced cirrhosis. No liver mass or focal lesion. Dense material is noted within the gallbladder consistent with stones and sludge, stable from the prior study. No bile duct dilation. Pancreas: Unremarkable. No pancreatic ductal dilatation or surrounding inflammatory changes. Spleen: Normal in size without focal abnormality. Adrenals/Urinary Tract: No adrenal masses. Bilobed 2.5 cm exophytic mass from the upper pole the right kidney consistent with a cyst, stable. Bilateral renal cortical thinning. No other masses, no stones and no hydronephrosis. Normal ureters. Normal bladder. Stomach/Bowel: Stomach is unremarkable. Small bowel and colon are normal in caliber. No bowel wall thickening or inflammation. Normal appendix suggested but not definitively seen. No evidence of appendicitis. Vascular/Lymphatic: Aortic atherosclerosis. No enlarged abdominal or pelvic lymph nodes. Reproductive: Uterus and bilateral adnexa are unremarkable. Other: Large amount of ascites distends the abdomen. No abdominal wall hernia. Musculoskeletal: No fracture or acute finding. No osteoblastic or osteolytic lesions. IMPRESSION: 1. No acute findings.  No bowel obstruction or bowel inflammation. 2. Large amount of ascites, similar to the prior CT. 3. Advanced cirrhosis. 4. Aortic atherosclerosis. Electronically Signed   By: Lajean Manes M.D.   On: 12/23/2018  19:07   US Paracentesis  Result Date: 12/07/2018 INDICATION: 63 year old female with recurrent large volume ascites secondary to alcoholic cirrhosis. She presents for paracentesis. EXAM: ULTRASOUND GUIDED  PARACENTESIS MEDICATIONS: None. COMPLICATIONS: None immediate. PROCEDURE: Informed written consent was obtained from the patient after a discussion of the risks, benefits and alternatives to treatment. A timeout was performed prior to the initiation of the procedure. Initial ultrasound scanning demonstrates a large amount of  ascites within the right lower abdominal quadrant. The right lower abdomen was prepped and draped in the usual sterile fashion. 1% lidocaine with epinephrine was used for local anesthesia. Following this, a 6 Fr Safe-T-Centesis catheter was introduced. An ultrasound image was saved for documentation purposes. The paracentesis was performed. The catheter was removed and a dressing was applied. The patient tolerated the procedure well without immediate post procedural complication. FINDINGS: A total of approximately 6700 mL of clear yellow fluid was removed. Samples were sent to the laboratory if requested by the clinical team. IMPRESSION: Successful ultrasound-guided paracentesis yielding 6.7 liters of peritoneal fluid. Electronically Signed   By: Jacqulynn Cadet M.D.   On: 12/07/2018 13:40    Microbiology: Recent Results (from the past 240 hour(s))  Blood culture (routine x 2)     Status: None (Preliminary result)   Collection Time: 12/23/18  6:08 PM  Result Value Ref Range Status   Specimen Description BLOOD LEFT ARM  Final   Special Requests   Final    BOTTLES DRAWN AEROBIC AND ANAEROBIC Blood Culture adequate volume Performed at [facility] [address], [place], [place] [address]    Culture   Final    NO GROWTH 4 DAYS Performed at [facility], [place] [address], [place], [place] [address]    Report Status PENDING  Incomplete  Blood culture (routine x 2)     Status: None (Preliminary result)   Collection Time: 12/23/18  6:16 PM  Result Value Ref Range Status   Specimen Description BLOOD RIGHT WRIST  Final   Special Requests   Final    BOTTLES DRAWN AEROBIC AND ANAEROBIC Blood Culture results may not be optimal due to an inadequate volume of blood received in culture bottles Performed at [facility], [place] [address], [place], [place] [address]    Culture   Final    NO GROWTH 4 DAYS Performed at [facility], [place] [address],  [place], [place] [address]    Report Status PENDING  Incomplete  MRSA PCR Screening     Status: None   Collection Time: 12/24/18  3:04 AM  Result Value Ref Range Status   MRSA by PCR NEGATIVE NEGATIVE Final    Comment:        The GeneXpert MRSA Assay (FDA approved for NASAL specimens only), is one component of a comprehensive MRSA colonization surveillance program. It is not intended to diagnose MRSA infection nor to guide or monitor treatment for MRSA infections. Performed at [facility], [place] [address], [place], [place] [address]      Labs: Basic Metabolic Panel: Recent Labs  Lab 12/23/18 1609 12/24/18 0649  NA 134* 134*  K 4.6 3.6  CL 106 106  CO2 16* 15*  GLUCOSE 104* 123*  BUN 54* 50*  CREATININE 4.91* 4.51*  CALCIUM 8.6* 7.9*   Liver Function Tests: Recent Labs  Lab 12/23/18 1609  AST 48*  ALT 25  ALKPHOS 107  BILITOT 1.0  PROT 5.8*  ALBUMIN 2.4*  Recent Labs  Lab 12/23/18 1609  LIPASE 36   Recent Labs  Lab 12/23/18 1610  AMMONIA 18   CBC: Recent Labs  Lab 12/23/18 1609 12/24/18 0649  WBC 15.1* 15.2*  NEUTROABS 11.6*  --   HGB 9.4* 8.9*  HCT 29.5* 28.6*  MCV 99.3 100.7*  PLT 285 264   Cardiac Enzymes: No results for input(s): CKTOTAL, CKMB, CKMBINDEX, TROPONINI in the last 168 hours. BNP: BNP (last 3 results) No results for input(s): BNP in the last 8760 hours.  ProBNP (last 3 results) No results for input(s): PROBNP in the last 8760 hours.  CBG: No results for input(s): GLUCAP in the last 168 hours.     Signed:  Kayleen Memos, MD [facility] 12/27/2018, 1:07 PM

## 2018-12-28 LAB — CULTURE, BLOOD (ROUTINE X 2)
Culture: NO GROWTH
Culture: NO GROWTH
Special Requests: ADEQUATE

## 2018-12-28 NOTE — Progress Notes (Addendum)
Called and gave report to [facility]. Husband notified of transfer.

## 2018-12-28 NOTE — Progress Notes (Signed)
LCSW following for residential hospice placement.  Patient has bed at [facility].   LCSW confirmed with facility.   LCSW faxed documents to facility.   Facility would like 10:30 pick up.   Patient will transport by [facility].   RN report # [phone]  BKJ

## 2018-12-28 NOTE — Discharge Summary (Signed)
Discharge Summary  [name] IOE:[id number] DOB: Jan 26, 1956  PCP: System, Pcp Not In  Admit date: 12/23/2018 Discharge date: 12/28/2018  Time spent: 35 minutes  Discharge delayed on 12/27/2018 due to bed placement.  Recommendations for Outpatient Follow-up:  1. Follow-up with hospice care  Discharge Diagnoses:  Active Hospital Problems   Diagnosis Date Noted  . Alcoholic liver disease ([place]) 12/05/2018  . Goals of care, counseling/discussion   . Generalized pain   . Hepatic encephalopathy ([place])   . Hypotension   . Renal failure 11/09/2018  . PAF (paroxysmal atrial fibrillation) ([place]) 11/26/2017    Resolved Hospital Problems  No resolved problems to display.      Diet recommendation: Pleasure feeding as tolerated  Vitals:   12/27/18 0637 12/27/18 2259  BP: (!) 85/68 (!) 57/36  Pulse: (!) 106 98  Resp: 14 16  Temp: 97.6 F (36.4 C) 97.6 F (36.4 C)  SpO2: 100% 92%    History of present illness:   63yo with ETOH cirrhosis presented with encephalopathy, found to be hypotensive requiring pressor support. Patient noted to have concerns of multi-organ system failure and conerns of hepatorenal syndrome. Patient wishes noted to be DNR with change to focus on comfort care. Pt transferred to hospitalist service 12/25/18.  12/27/2018: Patient seen and examined at bedside.  No acute events overnight.  Meeting arranged with palliative care team and family today 12/27/2018 at 12:30 PM to determine discharge planning.  12/28/2018: Patient seen and examined at bedside.  No acute events overnight.  Patient will be transferred to [facility], residential hospice.  Hospital Course:  Principal Problem:   Alcoholic liver disease ([place]) Active Problems:   PAF (paroxysmal atrial fibrillation) (HCC)   Renal failure   Hypotension   Hepatic encephalopathy (HCC)   Goals of care, counseling/discussion   Generalized pain  1. Hypotension 1. CCM discussed case with family who  desired more focus on comfort, as such pressors were discontinued with transition to focus on more conservative measures such as empiric abx and lactulose 2. Alcoholic liver disease 1. Continued on lactulose and xifaxan as tolerated 3. ARF 1. Concerns of hepatorenal syndrome 4. Afib 1. Last noted to be rate controlled 5. Hepatic encephalopathy 1. Currently on lactulose and xifaxan 6. End of Life 1. Palliative Care consulted and following. 2. Family had requested change to focus on comfort care 3. Meeting arranged with family and palliative care team to determine discharge planning.   DNR/DNI-comfort care   Consultations:  Palliative care team  Discharge Exam: BP (!) 57/36 (BP Location: Right Arm)   Pulse 98   Temp 97.6 F (36.4 C) (Oral)   Resp 16   Ht 5\' 4"  (1.626 m)   Wt 71.2 kg   SpO2 92%   BMI 26.94 kg/m  . General: 63 y.o. year-old female well developed well nourished in no acute distress.  Alert and somnolent. . Cardiovascular: Regular rate and rhythm with no rubs or gallops.  No thyromegaly or JVD noted.   Marland Kitchen Respiratory: Rales at bases with no wheezes.  Poor inspiratory effort.  . Abdomen: Distended moderately with hypoactive bowel sounds x4 quadrants. . Musculoskeletal: Emaciated with petechia. Marland Kitchen Psychiatry: Mood is appropriate for condition and setting  Discharge Instructions You were cared for by a hospitalist during your hospital stay. If you have any questions about your discharge medications or the care you received while you were in the hospital after you are discharged, you can call the unit and asked to speak  with the hospitalist on call if the hospitalist that took care of you is not available. Once you are discharged, your primary care physician will handle any further medical issues. Please note that NO REFILLS for any discharge medications will be authorized once you are discharged, as it is imperative that you return to your primary care physician (or  establish a relationship with a primary care physician if you do not have one) for your aftercare needs so that they can reassess your need for medications and monitor your lab values.   Allergies as of 12/28/2018   No Known Allergies     Medication List    STOP taking these medications   cephALEXin 250 MG capsule Commonly known as:  KEFLEX   feeding supplement (ENSURE ENLIVE) Liqd   lactulose 10 GM/15ML solution Commonly known as:  CHRONULAC   metoprolol tartrate 25 MG tablet Commonly known as:  LOPRESSOR   midodrine 5 MG tablet Commonly known as:  PROAMATINE   multivitamin-lutein Caps capsule   pantoprazole 40 MG tablet Commonly known as:  PROTONIX   rifaximin 550 MG Tabs tablet Commonly known as:  XIFAXAN   spironolactone 25 MG tablet Commonly known as:  ALDACTONE   thiamine 100 MG tablet     TAKE these medications   glycopyrrolate 1 MG tablet Commonly known as:  ROBINUL Take 1 tablet (1 mg total) by mouth every 4 (four) hours as needed (excessive secretions).   haloperidol 2 MG/ML solution Commonly known as:  HALDOL Place 0.3 mLs (0.6 mg total) under the tongue every 4 (four) hours as needed for agitation (or delirium).   LORazepam 0.5 MG tablet Commonly known as:  ATIVAN Place 1 tablet (0.5 mg total) under the tongue 2 (two) times daily.      No Known Allergies    The results of significant diagnostics from this hospitalization (including imaging, microbiology, ancillary and laboratory) are listed below for reference.    Significant Diagnostic Studies: Ct Abdomen Pelvis Wo Contrast  Result Date: 12/23/2018 CLINICAL DATA:  Pt BIB EMS from [facility] c/o small bowel obstruction. EMS reports pt had xray at facility. Facility recommended pt be brought here. Hx alcoholic cirrhosis, UTI. EXAM: CT ABDOMEN AND PELVIS WITHOUT CONTRAST TECHNIQUE: Multidetector CT imaging of the abdomen and pelvis was performed following the standard protocol without IV  contrast. COMPARISON:  CT, 11/09/2018 FINDINGS: Lower chest: Heart is normal in size. There is mild dependent lung base opacity, right greater than left, consistent with atelectasis. Hepatobiliary: Liver is small and nodule reflecting advanced cirrhosis. No liver mass or focal lesion. Dense material is noted within the gallbladder consistent with stones and sludge, stable from the prior study. No bile duct dilation. Pancreas: Unremarkable. No pancreatic ductal dilatation or surrounding inflammatory changes. Spleen: Normal in size without focal abnormality. Adrenals/Urinary Tract: No adrenal masses. Bilobed 2.5 cm exophytic mass from the upper pole the right kidney consistent with a cyst, stable. Bilateral renal cortical thinning. No other masses, no stones and no hydronephrosis. Normal ureters. Normal bladder. Stomach/Bowel: Stomach is unremarkable. Small bowel and colon are normal in caliber. No bowel wall thickening or inflammation. Normal appendix suggested but not definitively seen. No evidence of appendicitis. Vascular/Lymphatic: Aortic atherosclerosis. No enlarged abdominal or pelvic lymph nodes. Reproductive: Uterus and bilateral adnexa are unremarkable. Other: Large amount of ascites distends the abdomen. No abdominal wall hernia. Musculoskeletal: No fracture or acute finding. No osteoblastic or osteolytic lesions. IMPRESSION: 1. No acute findings.  No bowel obstruction or bowel inflammation.  2. Large amount of ascites, similar to the prior CT. 3. Advanced cirrhosis. 4. Aortic atherosclerosis. Electronically Signed   By: Lajean Manes M.D.   On: 12/23/2018 19:07   US Paracentesis  Result Date: 12/07/2018 INDICATION: 63 year old female with recurrent large volume ascites secondary to alcoholic cirrhosis. She presents for paracentesis. EXAM: ULTRASOUND GUIDED  PARACENTESIS MEDICATIONS: None. COMPLICATIONS: None immediate. PROCEDURE: Informed written consent was obtained from the patient after a discussion  of the risks, benefits and alternatives to treatment. A timeout was performed prior to the initiation of the procedure. Initial ultrasound scanning demonstrates a large amount of ascites within the right lower abdominal quadrant. The right lower abdomen was prepped and draped in the usual sterile fashion. 1% lidocaine with epinephrine was used for local anesthesia. Following this, a 6 Fr Safe-T-Centesis catheter was introduced. An ultrasound image was saved for documentation purposes. The paracentesis was performed. The catheter was removed and a dressing was applied. The patient tolerated the procedure well without immediate post procedural complication. FINDINGS: A total of approximately 6700 mL of clear yellow fluid was removed. Samples were sent to the laboratory if requested by the clinical team. IMPRESSION: Successful ultrasound-guided paracentesis yielding 6.7 liters of peritoneal fluid. Electronically Signed   By: Jacqulynn Cadet M.D.   On: 12/07/2018 13:40    Microbiology: Recent Results (from the past 240 hour(s))  Blood culture (routine x 2)     Status: None   Collection Time: 12/23/18  6:08 PM  Result Value Ref Range Status   Specimen Description BLOOD LEFT ARM  Final   Special Requests   Final    BOTTLES DRAWN AEROBIC AND ANAEROBIC Blood Culture adequate volume Performed at [facility] [address], [place], [place] [address]    Culture   Final    NO GROWTH 5 DAYS Performed at [facility], [place] [address] [place], [place] [address]    Report Status 12/28/2018 FINAL  Final  Blood culture (routine x 2)     Status: None   Collection Time: 12/23/18  6:16 PM  Result Value Ref Range Status   Specimen Description BLOOD RIGHT WRIST  Final   Special Requests   Final    BOTTLES DRAWN AEROBIC AND ANAEROBIC Blood Culture results may not be optimal due to an inadequate volume of blood received in culture bottles Performed at [facility], [place] [address] [place], [place] [address]    Culture   Final    NO GROWTH 5 DAYS Performed at [facility], [place] [address], [place],  [address]    Report Status 12/28/2018 FINAL  Final  MRSA PCR Screening     Status: None   Collection Time: 12/24/18  3:04 AM  Result Value Ref Range Status   MRSA by PCR NEGATIVE NEGATIVE Final    Comment:        The GeneXpert MRSA Assay (FDA approved for NASAL specimens only), is one component of a comprehensive MRSA colonization surveillance program. It is not intended to diagnose MRSA infection nor to guide or monitor treatment for MRSA infections. Performed at [facility], [place] [address], [place],  [address]      Labs: Basic Metabolic Panel: Recent Labs  Lab 12/23/18 1609 12/24/18 0649  NA 134* 134*  K 4.6 3.6  CL 106 106  CO2 16* 15*  GLUCOSE 104* 123*  BUN 54* 50*  CREATININE 4.91* 4.51*  CALCIUM 8.6* 7.9*   Liver  Function Tests: Recent Labs  Lab 12/23/18 1609  AST 48*  ALT 25  ALKPHOS 107  BILITOT 1.0  PROT 5.8*  ALBUMIN 2.4*   Recent Labs  Lab 12/23/18 1609  LIPASE 36   Recent Labs  Lab 12/23/18 1610  AMMONIA 18   CBC: Recent Labs  Lab 12/23/18 1609 12/24/18 0649  WBC 15.1* 15.2*  NEUTROABS 11.6*  --   HGB 9.4* 8.9*  HCT 29.5* 28.6*  MCV 99.3 100.7*  PLT 285 264   Cardiac Enzymes: No results for input(s): CKTOTAL, CKMB, CKMBINDEX, TROPONINI in the last 168 hours. BNP: BNP (last 3 results) No results for input(s): BNP in the last 8760 hours.  ProBNP (last 3 results) No results for input(s): PROBNP in the last 8760 hours.  CBG: No results for input(s): GLUCAP in the last 168 hours.     Signed:  Kayleen Memos, MD [facility] 12/28/2018, 10:01 AM

## 2019-01-03 ENCOUNTER — Encounter: Payer: Self-pay | Admitting: Gastroenterology

## 2019-01-03 ENCOUNTER — Ambulatory Visit: Payer: Self-pay | Admitting: Gastroenterology

## 2019-01-20 DEATH — deceased
